# Patient Record
Sex: Female | Born: 1975 | Race: Black or African American | Hispanic: No | Smoking: Current every day smoker
Health system: Southern US, Community
[De-identification: ages and names within clinical notes are randomized; demographics above are authoritative.]

## PROBLEM LIST (undated history)

## (undated) ENCOUNTER — Inpatient Hospital Stay (HOSPITAL_COMMUNITY): Payer: Medicaid Other

## (undated) ENCOUNTER — Emergency Department (HOSPITAL_COMMUNITY): Admission: EM | Payer: Medicaid Other | Source: Home / Self Care

## (undated) DIAGNOSIS — G4730 Sleep apnea, unspecified: Secondary | ICD-10-CM

## (undated) DIAGNOSIS — M1990 Unspecified osteoarthritis, unspecified site: Secondary | ICD-10-CM

## (undated) DIAGNOSIS — D649 Anemia, unspecified: Secondary | ICD-10-CM

## (undated) DIAGNOSIS — I10 Essential (primary) hypertension: Secondary | ICD-10-CM

## (undated) DIAGNOSIS — D179 Benign lipomatous neoplasm, unspecified: Secondary | ICD-10-CM

## (undated) DIAGNOSIS — F209 Schizophrenia, unspecified: Secondary | ICD-10-CM

## (undated) DIAGNOSIS — F32A Depression, unspecified: Secondary | ICD-10-CM

## (undated) DIAGNOSIS — F329 Major depressive disorder, single episode, unspecified: Secondary | ICD-10-CM

## (undated) DIAGNOSIS — F419 Anxiety disorder, unspecified: Secondary | ICD-10-CM

## (undated) DIAGNOSIS — J189 Pneumonia, unspecified organism: Secondary | ICD-10-CM

## (undated) DIAGNOSIS — K219 Gastro-esophageal reflux disease without esophagitis: Secondary | ICD-10-CM

## (undated) DIAGNOSIS — F319 Bipolar disorder, unspecified: Secondary | ICD-10-CM

## (undated) DIAGNOSIS — F1910 Other psychoactive substance abuse, uncomplicated: Secondary | ICD-10-CM

## (undated) DIAGNOSIS — E669 Obesity, unspecified: Secondary | ICD-10-CM

## (undated) HISTORY — PX: ABDOMINAL HYSTERECTOMY: SHX81

## (undated) HISTORY — PX: TUBAL LIGATION: SHX77

## (undated) HISTORY — PX: DENTAL SURGERY: SHX609

---

## 1997-04-19 ENCOUNTER — Ambulatory Visit (HOSPITAL_COMMUNITY): Admission: RE | Admit: 1997-04-19 | Discharge: 1997-04-19 | Payer: Self-pay | Admitting: Obstetrics

## 1997-04-22 ENCOUNTER — Inpatient Hospital Stay (HOSPITAL_COMMUNITY): Admission: AD | Admit: 1997-04-22 | Discharge: 1997-04-28 | Payer: Self-pay | Admitting: Obstetrics

## 1997-07-02 ENCOUNTER — Inpatient Hospital Stay (HOSPITAL_COMMUNITY): Admission: AD | Admit: 1997-07-02 | Discharge: 1997-07-02 | Payer: Self-pay | Admitting: Obstetrics

## 1997-08-19 ENCOUNTER — Inpatient Hospital Stay (HOSPITAL_COMMUNITY): Admission: AD | Admit: 1997-08-19 | Discharge: 1997-08-23 | Payer: Self-pay | Admitting: Obstetrics

## 1997-09-16 ENCOUNTER — Inpatient Hospital Stay (HOSPITAL_COMMUNITY): Admission: AD | Admit: 1997-09-16 | Discharge: 1997-09-20 | Payer: Self-pay | Admitting: Obstetrics & Gynecology

## 1998-02-20 ENCOUNTER — Other Ambulatory Visit: Admission: RE | Admit: 1998-02-20 | Discharge: 1998-02-20 | Payer: Self-pay | Admitting: Obstetrics

## 1998-02-20 ENCOUNTER — Inpatient Hospital Stay (HOSPITAL_COMMUNITY): Admission: AD | Admit: 1998-02-20 | Discharge: 1998-02-20 | Payer: Self-pay | Admitting: Obstetrics

## 1998-02-22 ENCOUNTER — Ambulatory Visit (HOSPITAL_COMMUNITY): Admission: RE | Admit: 1998-02-22 | Discharge: 1998-02-22 | Payer: Self-pay | Admitting: Obstetrics

## 1998-06-30 ENCOUNTER — Inpatient Hospital Stay (HOSPITAL_COMMUNITY): Admission: AD | Admit: 1998-06-30 | Discharge: 1998-06-30 | Payer: Self-pay | Admitting: Obstetrics

## 1998-07-16 ENCOUNTER — Emergency Department (HOSPITAL_COMMUNITY): Admission: EM | Admit: 1998-07-16 | Discharge: 1998-07-16 | Payer: Self-pay | Admitting: Emergency Medicine

## 1998-07-25 ENCOUNTER — Observation Stay (HOSPITAL_COMMUNITY): Admission: AD | Admit: 1998-07-25 | Discharge: 1998-07-26 | Payer: Self-pay | Admitting: Obstetrics

## 1998-07-30 ENCOUNTER — Inpatient Hospital Stay (HOSPITAL_COMMUNITY): Admission: AD | Admit: 1998-07-30 | Discharge: 1998-07-30 | Payer: Self-pay | Admitting: Obstetrics

## 1998-09-08 ENCOUNTER — Inpatient Hospital Stay (HOSPITAL_COMMUNITY): Admission: AD | Admit: 1998-09-08 | Discharge: 1998-09-08 | Payer: Self-pay | Admitting: Obstetrics

## 1998-09-16 ENCOUNTER — Encounter (HOSPITAL_COMMUNITY): Admission: RE | Admit: 1998-09-16 | Discharge: 1998-10-04 | Payer: Self-pay | Admitting: Obstetrics

## 1998-09-23 ENCOUNTER — Encounter: Payer: Self-pay | Admitting: Obstetrics

## 1998-10-03 ENCOUNTER — Inpatient Hospital Stay (HOSPITAL_COMMUNITY): Admission: AD | Admit: 1998-10-03 | Discharge: 1998-10-05 | Payer: Self-pay | Admitting: Obstetrics

## 1998-10-03 ENCOUNTER — Encounter (INDEPENDENT_AMBULATORY_CARE_PROVIDER_SITE_OTHER): Payer: Self-pay

## 1999-06-04 ENCOUNTER — Other Ambulatory Visit: Admission: RE | Admit: 1999-06-04 | Discharge: 1999-06-04 | Payer: Self-pay | Admitting: Obstetrics

## 1999-07-27 ENCOUNTER — Encounter: Payer: Self-pay | Admitting: Obstetrics

## 1999-07-27 ENCOUNTER — Inpatient Hospital Stay (HOSPITAL_COMMUNITY): Admission: AD | Admit: 1999-07-27 | Discharge: 1999-07-27 | Payer: Self-pay | Admitting: Obstetrics

## 1999-11-23 ENCOUNTER — Encounter: Payer: Self-pay | Admitting: Emergency Medicine

## 1999-11-23 ENCOUNTER — Emergency Department (HOSPITAL_COMMUNITY): Admission: EM | Admit: 1999-11-23 | Discharge: 1999-11-23 | Payer: Self-pay | Admitting: Emergency Medicine

## 2000-06-04 ENCOUNTER — Emergency Department (HOSPITAL_COMMUNITY): Admission: EM | Admit: 2000-06-04 | Discharge: 2000-06-04 | Payer: Self-pay | Admitting: Emergency Medicine

## 2001-09-15 ENCOUNTER — Emergency Department (HOSPITAL_COMMUNITY): Admission: EM | Admit: 2001-09-15 | Discharge: 2001-09-15 | Payer: Self-pay | Admitting: Emergency Medicine

## 2002-02-14 ENCOUNTER — Emergency Department (HOSPITAL_COMMUNITY): Admission: EM | Admit: 2002-02-14 | Discharge: 2002-02-14 | Payer: Self-pay | Admitting: Emergency Medicine

## 2002-11-02 ENCOUNTER — Emergency Department (HOSPITAL_COMMUNITY): Admission: EM | Admit: 2002-11-02 | Discharge: 2002-11-02 | Payer: Self-pay | Admitting: Emergency Medicine

## 2003-04-01 ENCOUNTER — Emergency Department (HOSPITAL_COMMUNITY): Admission: EM | Admit: 2003-04-01 | Discharge: 2003-04-01 | Payer: Self-pay | Admitting: Emergency Medicine

## 2003-06-27 ENCOUNTER — Emergency Department (HOSPITAL_COMMUNITY): Admission: EM | Admit: 2003-06-27 | Discharge: 2003-06-27 | Payer: Self-pay | Admitting: Emergency Medicine

## 2003-06-28 ENCOUNTER — Emergency Department (HOSPITAL_COMMUNITY): Admission: EM | Admit: 2003-06-28 | Discharge: 2003-06-29 | Payer: Self-pay | Admitting: Emergency Medicine

## 2003-07-02 ENCOUNTER — Emergency Department (HOSPITAL_COMMUNITY): Admission: EM | Admit: 2003-07-02 | Discharge: 2003-07-02 | Payer: Self-pay | Admitting: Emergency Medicine

## 2003-08-01 ENCOUNTER — Inpatient Hospital Stay (HOSPITAL_COMMUNITY): Admission: AD | Admit: 2003-08-01 | Discharge: 2003-08-01 | Payer: Self-pay | Admitting: Obstetrics

## 2003-11-08 ENCOUNTER — Emergency Department (HOSPITAL_COMMUNITY): Admission: EM | Admit: 2003-11-08 | Discharge: 2003-11-08 | Payer: Self-pay

## 2004-01-11 ENCOUNTER — Emergency Department (HOSPITAL_COMMUNITY): Admission: EM | Admit: 2004-01-11 | Discharge: 2004-01-11 | Payer: Self-pay | Admitting: Emergency Medicine

## 2004-04-14 ENCOUNTER — Emergency Department (HOSPITAL_COMMUNITY): Admission: EM | Admit: 2004-04-14 | Discharge: 2004-04-14 | Payer: Self-pay | Admitting: Emergency Medicine

## 2004-07-15 ENCOUNTER — Emergency Department (HOSPITAL_COMMUNITY): Admission: EM | Admit: 2004-07-15 | Discharge: 2004-07-15 | Payer: Self-pay | Admitting: Internal Medicine

## 2004-12-03 ENCOUNTER — Emergency Department (HOSPITAL_COMMUNITY): Admission: EM | Admit: 2004-12-03 | Discharge: 2004-12-03 | Payer: Self-pay | Admitting: Emergency Medicine

## 2005-07-10 ENCOUNTER — Emergency Department (HOSPITAL_COMMUNITY): Admission: EM | Admit: 2005-07-10 | Discharge: 2005-07-10 | Payer: Self-pay | Admitting: Emergency Medicine

## 2005-10-24 ENCOUNTER — Emergency Department (HOSPITAL_COMMUNITY): Admission: EM | Admit: 2005-10-24 | Discharge: 2005-10-24 | Payer: Self-pay | Admitting: Emergency Medicine

## 2006-07-10 ENCOUNTER — Emergency Department (HOSPITAL_COMMUNITY): Admission: EM | Admit: 2006-07-10 | Discharge: 2006-07-10 | Payer: Self-pay | Admitting: Emergency Medicine

## 2006-10-31 ENCOUNTER — Emergency Department (HOSPITAL_COMMUNITY): Admission: EM | Admit: 2006-10-31 | Discharge: 2006-10-31 | Payer: Self-pay | Admitting: Emergency Medicine

## 2006-11-08 ENCOUNTER — Emergency Department (HOSPITAL_COMMUNITY): Admission: EM | Admit: 2006-11-08 | Discharge: 2006-11-09 | Payer: Self-pay | Admitting: Emergency Medicine

## 2007-09-13 ENCOUNTER — Emergency Department (HOSPITAL_COMMUNITY): Admission: EM | Admit: 2007-09-13 | Discharge: 2007-09-13 | Payer: Self-pay | Admitting: Emergency Medicine

## 2008-03-05 ENCOUNTER — Emergency Department (HOSPITAL_COMMUNITY): Admission: EM | Admit: 2008-03-05 | Discharge: 2008-03-05 | Payer: Self-pay | Admitting: Emergency Medicine

## 2008-07-28 ENCOUNTER — Emergency Department (HOSPITAL_COMMUNITY): Admission: EM | Admit: 2008-07-28 | Discharge: 2008-07-28 | Payer: Self-pay | Admitting: Emergency Medicine

## 2009-02-07 ENCOUNTER — Emergency Department (HOSPITAL_COMMUNITY): Admission: EM | Admit: 2009-02-07 | Discharge: 2009-02-07 | Payer: Self-pay | Admitting: Emergency Medicine

## 2009-03-09 ENCOUNTER — Emergency Department (HOSPITAL_COMMUNITY): Admission: EM | Admit: 2009-03-09 | Discharge: 2009-03-09 | Payer: Self-pay | Admitting: Family Medicine

## 2009-03-13 ENCOUNTER — Emergency Department (HOSPITAL_COMMUNITY): Admission: EM | Admit: 2009-03-13 | Discharge: 2009-03-13 | Payer: Self-pay | Admitting: Emergency Medicine

## 2009-04-11 ENCOUNTER — Emergency Department (HOSPITAL_COMMUNITY): Admission: EM | Admit: 2009-04-11 | Discharge: 2009-04-11 | Payer: Self-pay | Admitting: Emergency Medicine

## 2009-04-15 ENCOUNTER — Emergency Department (HOSPITAL_COMMUNITY): Admission: EM | Admit: 2009-04-15 | Discharge: 2009-04-15 | Payer: Self-pay | Admitting: Emergency Medicine

## 2009-06-18 ENCOUNTER — Emergency Department (HOSPITAL_COMMUNITY): Admission: EM | Admit: 2009-06-18 | Discharge: 2009-06-18 | Payer: Self-pay | Admitting: Emergency Medicine

## 2009-08-19 ENCOUNTER — Emergency Department (HOSPITAL_COMMUNITY): Admission: EM | Admit: 2009-08-19 | Discharge: 2009-08-19 | Payer: Self-pay | Admitting: Emergency Medicine

## 2010-02-20 ENCOUNTER — Emergency Department (HOSPITAL_COMMUNITY)
Admission: EM | Admit: 2010-02-20 | Discharge: 2010-02-20 | Payer: Self-pay | Source: Home / Self Care | Admitting: Emergency Medicine

## 2010-05-19 LAB — URINALYSIS, ROUTINE W REFLEX MICROSCOPIC
Bilirubin Urine: NEGATIVE
Nitrite: NEGATIVE
Urobilinogen, UA: 0.2 mg/dL (ref 0.0–1.0)

## 2010-05-19 LAB — BASIC METABOLIC PANEL
BUN: 9 mg/dL (ref 6–23)
CO2: 25 mEq/L (ref 19–32)
Chloride: 102 mEq/L (ref 96–112)
Creatinine, Ser: 0.95 mg/dL (ref 0.4–1.2)
GFR calc Af Amer: >60 mL/min (ref >60)
Glucose, Bld: 87 mg/dL (ref 70–99)
Potassium: 3.9 mEq/L (ref 3.5–5.1)

## 2010-05-19 LAB — DIFFERENTIAL
Basophils Absolute: 0 10*3/uL (ref 0.0–0.1)
Eosinophils Absolute: 0.2 10*3/uL (ref 0.0–0.7)
Lymphocytes Relative: 29 % (ref 12–46)
Monocytes Absolute: 1 10*3/uL (ref 0.1–1.0)
Neutro Abs: 6.3 10*3/uL (ref 1.7–7.7)
Neutrophils Relative %: 60 % (ref 43–77)

## 2010-05-19 LAB — WET PREP, GENITAL
WBC, Wet Prep HPF POC: NONE SEEN
Yeast Wet Prep HPF POC: NONE SEEN

## 2010-05-19 LAB — CBC
HCT: 37.4 % (ref 36.0–46.0)
MCV: 76 fL — ABNORMAL LOW (ref 78.0–100.0)
WBC: 10.5 10*3/uL (ref 4.0–10.5)

## 2010-05-24 LAB — URINALYSIS, ROUTINE W REFLEX MICROSCOPIC
Bilirubin Urine: NEGATIVE
Glucose, UA: NEGATIVE mg/dL
Hgb urine dipstick: NEGATIVE
Ketones, ur: NEGATIVE mg/dL
Nitrite: NEGATIVE
Protein, ur: NEGATIVE mg/dL
Urobilinogen, UA: 0.2 mg/dL (ref 0.0–1.0)

## 2010-05-24 LAB — URINE MICROSCOPIC-ADD ON

## 2010-06-10 LAB — POCT I-STAT, CHEM 8
BUN: 5 mg/dL — ABNORMAL LOW (ref 6–23)
Chloride: 105 mEq/L (ref 96–112)
Creatinine, Ser: 0.8 mg/dL (ref 0.4–1.2)
Glucose, Bld: 84 mg/dL (ref 70–99)
HCT: 39 % (ref 36.0–46.0)
Sodium: 141 mEq/L (ref 135–145)

## 2010-06-10 LAB — HEMOCCULT GUIAC POC 1CARD (OFFICE): Fecal Occult Bld: NEGATIVE

## 2010-12-04 LAB — WET PREP, GENITAL: Yeast Wet Prep HPF POC: NONE SEEN

## 2010-12-04 LAB — URINALYSIS, ROUTINE W REFLEX MICROSCOPIC
Bilirubin Urine: NEGATIVE
Glucose, UA: NEGATIVE
Ketones, ur: NEGATIVE
Nitrite: NEGATIVE
Specific Gravity, Urine: 1.018

## 2010-12-04 LAB — URINE MICROSCOPIC-ADD ON

## 2011-02-07 ENCOUNTER — Encounter: Payer: Self-pay | Admitting: *Deleted

## 2011-02-07 ENCOUNTER — Emergency Department (HOSPITAL_COMMUNITY)
Admission: EM | Admit: 2011-02-07 | Discharge: 2011-02-07 | Disposition: A | Discharge status: Home / Self Care | Payer: Self-pay | Attending: Emergency Medicine | Admitting: Emergency Medicine

## 2011-02-07 DIAGNOSIS — R05 Cough: Secondary | ICD-10-CM | POA: Insufficient documentation

## 2011-02-07 DIAGNOSIS — J029 Acute pharyngitis, unspecified: Secondary | ICD-10-CM | POA: Insufficient documentation

## 2011-02-07 DIAGNOSIS — N898 Other specified noninflammatory disorders of vagina: Secondary | ICD-10-CM | POA: Insufficient documentation

## 2011-02-07 DIAGNOSIS — R059 Cough, unspecified: Secondary | ICD-10-CM | POA: Insufficient documentation

## 2011-02-07 DIAGNOSIS — R109 Unspecified abdominal pain: Secondary | ICD-10-CM | POA: Insufficient documentation

## 2011-02-07 DIAGNOSIS — E669 Obesity, unspecified: Secondary | ICD-10-CM | POA: Insufficient documentation

## 2011-02-07 LAB — POCT I-STAT, CHEM 8
BUN: 10 mg/dL (ref 6–23)
Calcium, Ion: 1.08 mmol/L — ABNORMAL LOW (ref 1.12–1.32)
Chloride: 104 mEq/L (ref 96–112)
Creatinine, Ser: 1.1 mg/dL (ref 0.50–1.10)
Glucose, Bld: 85 mg/dL (ref 70–99)
HCT: 39 % (ref 36.0–46.0)
Hemoglobin: 13.3 g/dL (ref 12.0–15.0)
Potassium: 3.8 mEq/L (ref 3.5–5.1)
Sodium: 139 mEq/L (ref 135–145)
TCO2: 23 mmol/L (ref 0–100)

## 2011-02-07 LAB — URINALYSIS, ROUTINE W REFLEX MICROSCOPIC
Bilirubin Urine: NEGATIVE
Glucose, UA: NEGATIVE mg/dL
Ketones, ur: NEGATIVE mg/dL
Leukocytes, UA: NEGATIVE
Protein, ur: NEGATIVE mg/dL
pH: 6.5 (ref 5.0–8.0)

## 2011-02-07 LAB — URINE MICROSCOPIC-ADD ON

## 2011-02-07 LAB — WET PREP, GENITAL
Clue Cells Wet Prep HPF POC: NONE SEEN
Trich, Wet Prep: NONE SEEN
Yeast Wet Prep HPF POC: NONE SEEN

## 2011-02-07 LAB — POCT PREGNANCY, URINE: Preg Test, Ur: NEGATIVE

## 2011-02-07 MED ORDER — OXYCODONE-ACETAMINOPHEN 5-325 MG PO TABS: 1.0000 | ORAL_TABLET | Freq: Four times a day (QID) | ORAL | Status: AC | PRN

## 2011-02-07 MED ORDER — OXYCODONE-ACETAMINOPHEN 5-325 MG PO TABS
1.0000 | ORAL_TABLET | Freq: Once | ORAL | Status: AC
  Administered 2011-02-07: 1 via ORAL
  Filled 2011-02-07: qty 1

## 2011-02-07 MED ORDER — TRAMADOL HCL 50 MG PO TABS: 50.0000 mg | ORAL_TABLET | Freq: Four times a day (QID) | ORAL | Status: DC | PRN

## 2011-02-07 NOTE — ED Provider Notes (Signed)
History     CSN: [id number] Arrival date & time: 02/07/2011  7:03 AM   First MD Initiated Contact with Patient 02/07/11 0725      Chief Complaint  Patient presents with  . Abdominal Pain    (Consider location/radiation/quality/duration/timing/severity/associated sxs/prior treatment) The history is provided by the patient.   patient is a 35 year old female who presents with multiple complaints. She reports lower abdominal cramping x2 days; this started the day after the onset of her menses. She has been taking ibuprofen for the pain with transient relief. The pain does not radiate anywhere; it is moderate to severe. It is intermittent. There is no associated nausea, vomiting, diarrhea or fever.   She also complains of a sore throat on sudden in onset last night but is associated with a cough but no fever, chills, sinus congestion, ear pain. The pain is sharp it does not radiate anywhere it is severe. She has not tried anything to relieve the pain. There are no aggravating or alleviating factors.  History reviewed. No pertinent past medical history.  History reviewed. No pertinent past surgical history.  History reviewed. No pertinent family history.  History  Substance Use Topics  . Smoking status: Not on file  . Smokeless tobacco: Not on file  . Alcohol Use: Not on file    Review of Systems  Constitutional: Negative for fever and chills.  HENT: Positive for sore throat. Negative for ear pain, nosebleeds, congestion, trouble swallowing, neck pain, neck stiffness, voice change and tinnitus.   Eyes: Negative for pain and visual disturbance.  Respiratory: Negative for cough and shortness of breath.   Cardiovascular: Negative for chest pain and leg swelling.  Gastrointestinal: Positive for abdominal pain. Negative for nausea, vomiting and diarrhea.  Genitourinary: Positive for vaginal discharge. Negative for dysuria, hematuria, flank pain and vaginal pain.       Positive menses    Musculoskeletal: Negative for back pain and gait problem.  Skin: Negative for rash and wound.  Neurological: Negative for dizziness, weakness, light-headedness and headaches.  Hematological: Does not bruise/bleed easily.  Psychiatric/Behavioral: Negative for behavioral problems and confusion.    Allergies  Ceftin  Home Medications   Current Outpatient Rx  Name Route Sig Dispense Refill  . HYDROCHLOROTHIAZIDE 25 MG PO TABS Oral Take 25 mg by mouth daily.        BP 126/84  Pulse 87  Temp(Src) 98.6 F (37 C) (Oral)  Resp 18  SpO2 98%  LMP 02/07/2011  Physical Exam  Nursing note and vitals reviewed. Constitutional: She is oriented to person, place, and time. She appears well-developed and well-nourished.       Uncomfortable appearing obese female  HENT:  Head: Normocephalic and atraumatic.  Right Ear: Hearing, tympanic membrane, external ear and ear canal normal.  Left Ear: Hearing, tympanic membrane, external ear and ear canal normal.  Nose: Nose normal.  Mouth/Throat: Uvula is midline and mucous membranes are normal. No uvula swelling. Oropharyngeal exudate and posterior oropharyngeal erythema present. No posterior oropharyngeal edema or tonsillar abscesses.  Eyes: Conjunctivae and EOM are normal. Pupils are equal, round, and reactive to light. Right eye exhibits no discharge. Left eye exhibits no discharge.  Neck: Normal range of motion. Neck supple.  Cardiovascular: Normal rate, regular rhythm, normal heart sounds and intact distal pulses.   No murmur heard. Pulmonary/Chest: Effort normal and breath sounds normal. No respiratory distress. She exhibits no tenderness.  Abdominal: Soft. Bowel sounds are normal. She exhibits no distension and no mass. There  is no rebound and no guarding.       Mild suprapubic tenderness to palpation  Genitourinary: There is no rash, tenderness or lesion on the right labia. There is no rash, tenderness or lesion on the left labia. Cervix  exhibits no motion tenderness and no friability. Right adnexum displays no mass and no tenderness. Left adnexum displays no mass and no tenderness. There is bleeding around the vagina. No erythema or tenderness around the vagina. No vaginal discharge found.  Musculoskeletal: Normal range of motion. She exhibits no edema and no tenderness.  Lymphadenopathy:    She has no cervical adenopathy.  Neurological: She is alert and oriented to person, place, and time. No cranial nerve deficit. Coordination normal.  Skin: Skin is warm and dry. No rash noted.  Psychiatric: She has a normal mood and affect. Her behavior is normal.    ED Course  Procedures (including critical care time)  Labs Reviewed  URINALYSIS, ROUTINE W REFLEX MICROSCOPIC - Abnormal; Notable for the following:    Hgb urine dipstick MODERATE (*)    All other components within normal limits  POCT I-STAT, CHEM 8 - Abnormal; Notable for the following:    Calcium, Ion 1.08 (*)    All other components within normal limits  URINE MICROSCOPIC-ADD ON - Abnormal; Notable for the following:    Squamous Epithelial / LPF FEW (*)    All other components within normal limits  WET PREP, GENITAL - Abnormal; Notable for the following:    WBC, Wet Prep HPF POC RARE (*)    All other components within normal limits  RAPID STREP SCREEN  POCT PREGNANCY, URINE  I-STAT, CHEM 8  POCT PREGNANCY, URINE  GC/CHLAMYDIA PROBE AMP, GENITAL   No results found.   1. Pharyngitis   2. Abdominal cramps       MDM  Laboratory studies reviewed. Hematuria most likely secondary to menses. Patient with an apparent viral pharyngitis. Have discussed saltwater gargles and other supportive measures. Also with abdominal cramping that seems most consistent with menstrual cramping. Patient has been comfortable in the emergency department after administration of a small amount of oral pain medication. Will discharge home and advise followup if pain continues. Patient  agrees with the plan of care.        Elwyn Reach [place], [place] 02/07/11 1112

## 2011-02-07 NOTE — ED Provider Notes (Signed)
Medical screening examination/treatment/procedure(s) were performed by non-physician practitioner and as supervising physician I was immediately available for consultation/collaboration.  Raeford Razor, MD 02/07/11 [phone]

## 2011-02-07 NOTE — ED Notes (Signed)
Patient verbalized understanding of discharge instructions.  Patient ambulatory upon discharge. Patient also reporting she is allergic to Tramadol, Steph[place], PA notified

## 2011-02-07 NOTE — ED Notes (Signed)
The pt has her period and she is  C/o lower abd cramps since last pm.  lmp now

## 2011-02-07 NOTE — ED Notes (Signed)
Pt alert and oriented. NAD. C/o lower abdominal pain. Pt reports feels like "cramps" but pt reports she usually doesn't have cramps with her cycle. Pt c/o sore throat since last night. Reporting runny nose, coughing up thin, clear mucus

## 2011-02-08 ENCOUNTER — Emergency Department (HOSPITAL_COMMUNITY)
Admission: EM | Admit: 2011-02-08 | Discharge: 2011-02-08 | Disposition: A | Discharge status: Home / Self Care | Payer: Self-pay | Attending: Emergency Medicine | Admitting: Emergency Medicine

## 2011-02-08 ENCOUNTER — Encounter (HOSPITAL_COMMUNITY): Payer: Self-pay | Admitting: *Deleted

## 2011-02-08 DIAGNOSIS — M2550 Pain in unspecified joint: Secondary | ICD-10-CM | POA: Insufficient documentation

## 2011-02-08 DIAGNOSIS — R42 Dizziness and giddiness: Secondary | ICD-10-CM | POA: Insufficient documentation

## 2011-02-08 DIAGNOSIS — R070 Pain in throat: Secondary | ICD-10-CM | POA: Insufficient documentation

## 2011-02-08 DIAGNOSIS — R197 Diarrhea, unspecified: Secondary | ICD-10-CM | POA: Insufficient documentation

## 2011-02-08 DIAGNOSIS — R509 Fever, unspecified: Secondary | ICD-10-CM | POA: Insufficient documentation

## 2011-02-08 DIAGNOSIS — R1032 Left lower quadrant pain: Secondary | ICD-10-CM | POA: Insufficient documentation

## 2011-02-08 DIAGNOSIS — IMO0001 Reserved for inherently not codable concepts without codable children: Secondary | ICD-10-CM | POA: Insufficient documentation

## 2011-02-08 DIAGNOSIS — M542 Cervicalgia: Secondary | ICD-10-CM | POA: Insufficient documentation

## 2011-02-08 DIAGNOSIS — J111 Influenza due to unidentified influenza virus with other respiratory manifestations: Secondary | ICD-10-CM | POA: Insufficient documentation

## 2011-02-08 DIAGNOSIS — J3489 Other specified disorders of nose and nasal sinuses: Secondary | ICD-10-CM | POA: Insufficient documentation

## 2011-02-08 DIAGNOSIS — Z79899 Other long term (current) drug therapy: Secondary | ICD-10-CM | POA: Insufficient documentation

## 2011-02-08 DIAGNOSIS — R5381 Other malaise: Secondary | ICD-10-CM | POA: Insufficient documentation

## 2011-02-08 DIAGNOSIS — M549 Dorsalgia, unspecified: Secondary | ICD-10-CM | POA: Insufficient documentation

## 2011-02-08 DIAGNOSIS — R059 Cough, unspecified: Secondary | ICD-10-CM | POA: Insufficient documentation

## 2011-02-08 DIAGNOSIS — R05 Cough: Secondary | ICD-10-CM | POA: Insufficient documentation

## 2011-02-08 DIAGNOSIS — R51 Headache: Secondary | ICD-10-CM | POA: Insufficient documentation

## 2011-02-08 MED ORDER — IBUPROFEN 200 MG PO TABS
ORAL_TABLET | ORAL | Status: AC
  Filled 2011-02-08: qty 3

## 2011-02-08 MED ORDER — OSELTAMIVIR PHOSPHATE 75 MG PO CAPS: 75.0000 mg | ORAL_CAPSULE | Freq: Two times a day (BID) | ORAL | Status: AC

## 2011-02-08 MED ORDER — SODIUM CHLORIDE 0.9 % IV BOLUS (SEPSIS)
1000.0000 mL | Freq: Once | INTRAVENOUS | Status: AC
  Administered 2011-02-08: 1000 mL via INTRAVENOUS

## 2011-02-08 MED ORDER — IBUPROFEN 200 MG PO TABS
600.0000 mg | ORAL_TABLET | Freq: Once | ORAL | Status: AC
  Administered 2011-02-08: 600 mg via ORAL

## 2011-02-08 MED ORDER — IBUPROFEN 600 MG PO TABS: 600.0000 mg | ORAL_TABLET | Freq: Four times a day (QID) | ORAL | Status: AC | PRN

## 2011-02-08 MED ORDER — GUAIFENESIN-DM 100-10 MG/5ML PO SYRP: 5.0000 mL | ORAL_SOLUTION | Freq: Three times a day (TID) | ORAL | Status: AC | PRN

## 2011-02-08 MED ORDER — BENZONATATE 200 MG PO CAPS: 200.0000 mg | ORAL_CAPSULE | Freq: Three times a day (TID) | ORAL | Status: AC | PRN

## 2011-02-08 NOTE — ED Notes (Signed)
Reports being seen yesterday for cold symptoms, dc home and still doesn't feel good. Having cough, pain in back with cough, has congestion and sore throat. Mask on pt at triage. No resp distress noted.

## 2011-02-08 NOTE — ED Provider Notes (Signed)
History     CSN: [id number] Arrival date & time: 02/08/2011  6:49 AM   First MD Initiated Contact with Patient 02/08/11 [phone]      Chief Complaint  Patient presents with  . Sore Throat  . Cough    (Consider location/radiation/quality/duration/timing/severity/associated sxs/prior treatment) The history is provided by the patient.   Reports being seen yesterday for cold symptoms and abdominal pain. Was swabed for strep A and was found to be negative. Pelvic exam was benign, u preg was negative and UA was clean. Was diagnsosed with URI and menstrual cramping. Was then discharged home and still doesn't feel good. Having cough, pain in back with cough, has congestion and sore throat. States that cough is dry and non productive with associated rhinorrhea. Also notes sinus fullness and full feeling in the right eye and ear.  Denies pain in either location.  Endorses fevers and chills and a temp to 102 at home.  States had just a twinge of blood in sputum last night, but otherwise not.  States that she has a headache after coughing spells.  Having trouble sleeping.  Endorses myalgias and arthralgias predominantly in her upper spine and  left lateral back. Back pain is worse with cough. States that percocet broke the fevers and gave her some relief. Mentrual period is currently happening and she is having some mild residual LLQ abdominal pain that is improved with holding pressure on the location.  States had a small amount of diarrhea last night.   Denies exertional chest pain, SOB, blood in stool or urine.       History reviewed. No pertinent past medical history. Other than previously stated  History reviewed. No pertinent past surgical history.  History reviewed. No pertinent family history.  History  Substance Use Topics  . Smoking status: Not on file  . Smokeless tobacco: Not on file  . Alcohol Use: Not on file    OB History    Grav Para Term Preterm Abortions TAB SAB Ect Mult Living                    Review of Systems  Constitutional: Positive for fever, chills and malaise/fatigue.  HENT: Positive for congestion, sore throat and neck pain. Negative for hearing loss, ear pain, nosebleeds, tinnitus and ear discharge.   Eyes: Negative for blurred vision, pain and redness.  Respiratory: Positive for cough, hemoptysis and sputum production. Negative for shortness of breath, wheezing and stridor.   Cardiovascular: Negative for chest pain, palpitations and orthopnea.  Gastrointestinal: Positive for abdominal pain and diarrhea. Negative for vomiting, constipation, blood in stool and melena.  Genitourinary: Negative for dysuria, urgency, frequency, hematuria and flank pain.  Musculoskeletal: Positive for myalgias, back pain and joint pain.  Skin: Negative for rash.  Neurological: Positive for dizziness, weakness and headaches. Negative for tingling and loss of consciousness.   Review of Systems  Constitutional: Positive for fever, chills and malaise/fatigue.  HENT: Positive for congestion, sore throat and neck pain. Negative for hearing loss, ear pain, nosebleeds, tinnitus and ear discharge.   Eyes: Negative for blurred vision, pain and redness.  Respiratory: Positive for cough, hemoptysis and sputum production. Negative for shortness of breath, wheezing and stridor.   Cardiovascular: Negative for chest pain, palpitations and orthopnea.  Gastrointestinal: Positive for abdominal pain and diarrhea. Negative for vomiting, constipation, blood in stool and melena.  Genitourinary: Negative for dysuria, urgency, frequency, hematuria and flank pain.  Musculoskeletal: Positive for myalgias, back pain and joint  pain.  Skin: Negative for rash.  Neurological: Positive for dizziness, weakness and headaches. Negative for tingling and loss of consciousness.    Allergies  Tramadol and Ceftin  Home Medications   Current Outpatient Rx  Name Route Sig Dispense Refill  . ACETAMINOPHEN  500 MG PO TABS Oral Take 1,000 mg by mouth every 6 (six) hours as needed. For pain     . HYDROCHLOROTHIAZIDE 25 MG PO TABS Oral Take 25 mg by mouth daily.      . IBUPROFEN 200 MG PO TABS Oral Take 400 mg by mouth every 6 (six) hours as needed. For cramps     . OXYCODONE-ACETAMINOPHEN 5-325 MG PO TABS Oral Take 1 tablet by mouth every 6 (six) hours as needed for pain. 10 tablet 0    BP 151/87  Pulse 113  Temp(Src) 101.8 F (38.8 C) (Oral)  Resp 20  SpO2 97%  LMP 02/07/2011  Physical Exam  Constitutional: She is oriented to person, place, and time. No distress.  HENT:  Head: Normocephalic.  Right Ear: External ear and ear canal normal. No drainage, swelling or tenderness. Tympanic membrane is not perforated and not erythematous. No middle ear effusion. No decreased hearing is noted.  Left Ear: No drainage, swelling or tenderness. Tympanic membrane is not perforated and not erythematous.  No middle ear effusion. No decreased hearing is noted.  Nose: Mucosal edema and rhinorrhea present.  Mouth/Throat: No oral lesions. Posterior oropharyngeal edema and posterior oropharyngeal erythema present. No oropharyngeal exudate.  Eyes: Pupils are equal, round, and reactive to light.  Neck: No JVD present. No tracheal deviation present. No thyromegaly present.  Cardiovascular: Exam reveals no friction rub.   No murmur heard. Pulmonary/Chest: Effort normal and breath sounds normal. No stridor. No respiratory distress. She has no wheezes. She has no rales. She exhibits no tenderness.  Abdominal: Soft. Bowel sounds are normal. She exhibits no distension. There is no tenderness. There is no rebound and no guarding.       No significant pain on palpation of LLQ. Pt states it felt better with pressure  Musculoskeletal: She exhibits no edema and no tenderness.  Lymphadenopathy:    She has no cervical adenopathy.  Neurological: She is alert and oriented to person, place, and time. No cranial nerve deficit.  She exhibits normal muscle tone. Coordination normal.  Skin: Skin is warm and dry. No rash noted. She is not diaphoretic. No erythema. No pallor.  Psychiatric: She has a normal mood and affect. Her behavior is normal. Judgment and thought content normal.    ED Course  Procedures (including critical care time) None. Given 1L NS and ibuprofen in the ED.  Labs Reviewed - No data to display No results found.   No diagnosis found.    MDM   Pt most likely has influenza vs viral URI. Totally clear lung exam makes PNA less likely.  No real sinus pain, just full feeling from congestion.  Given documented fever in the ED and relatively short time course, elected to treat for acute influenza with tamiflu.  Gave 1L NS in the ED for description of orthostasis.  Treat pain with NSAIDs. Gave 4 day work/school note and D/C to home.         Quentin Ore, MD 02/08/11 [phone]

## 2011-02-08 NOTE — ED Provider Notes (Signed)
I saw and evaluated the patient, reviewed the resident's note and I agree with the findings and plan.   .Face to face Exam:  General:  Awake HEENT:  Atraumatic Resp:  Normal effort Abd:  Nondistended Neuro:No focal weakness Lymph: No adenopathy   Nelia Shi, MD 02/08/11 1850

## 2011-02-09 LAB — GC/CHLAMYDIA PROBE AMP, GENITAL
Chlamydia, DNA Probe: NEGATIVE
GC Probe Amp, Genital: NEGATIVE

## 2011-07-09 ENCOUNTER — Encounter (HOSPITAL_COMMUNITY): Payer: Self-pay | Admitting: *Deleted

## 2011-07-09 ENCOUNTER — Emergency Department (HOSPITAL_COMMUNITY)
Admission: EM | Admit: 2011-07-09 | Discharge: 2011-07-09 | Disposition: A | Discharge status: Home / Self Care | Payer: Self-pay | Attending: Emergency Medicine | Admitting: Emergency Medicine

## 2011-07-09 DIAGNOSIS — M545 Low back pain, unspecified: Secondary | ICD-10-CM | POA: Insufficient documentation

## 2011-07-09 DIAGNOSIS — R109 Unspecified abdominal pain: Secondary | ICD-10-CM | POA: Insufficient documentation

## 2011-07-09 HISTORY — DX: Essential (primary) hypertension: I10

## 2011-07-09 HISTORY — DX: Obesity, unspecified: E66.9

## 2011-07-09 LAB — URINALYSIS, ROUTINE W REFLEX MICROSCOPIC
Glucose, UA: NEGATIVE mg/dL
Leukocytes, UA: NEGATIVE
Protein, ur: NEGATIVE mg/dL
pH: 6.5 (ref 5.0–8.0)

## 2011-07-09 LAB — POCT PREGNANCY, URINE: Preg Test, Ur: NEGATIVE

## 2011-07-09 NOTE — ED Notes (Signed)
Called 2x no answer

## 2011-07-09 NOTE — ED Notes (Signed)
Dull cramp and pain in lower abdomen and lower back.  Increasing pain when having a BM.  No pain or burning with urination.  Symptoms began this am.

## 2011-07-09 NOTE — ED Notes (Signed)
Pt called 2 more times, no answer. Pt LWBS after triage

## 2011-08-15 ENCOUNTER — Emergency Department (HOSPITAL_COMMUNITY): Payer: Self-pay

## 2011-08-15 ENCOUNTER — Emergency Department (HOSPITAL_COMMUNITY)
Admission: EM | Admit: 2011-08-15 | Discharge: 2011-08-15 | Disposition: A | Discharge status: Home / Self Care | Payer: Self-pay | Attending: Emergency Medicine | Admitting: Emergency Medicine

## 2011-08-15 ENCOUNTER — Encounter (HOSPITAL_COMMUNITY): Payer: Self-pay | Admitting: Emergency Medicine

## 2011-08-15 DIAGNOSIS — I10 Essential (primary) hypertension: Secondary | ICD-10-CM | POA: Insufficient documentation

## 2011-08-15 DIAGNOSIS — R0602 Shortness of breath: Secondary | ICD-10-CM | POA: Insufficient documentation

## 2011-08-15 DIAGNOSIS — R079 Chest pain, unspecified: Secondary | ICD-10-CM | POA: Insufficient documentation

## 2011-08-15 DIAGNOSIS — J189 Pneumonia, unspecified organism: Secondary | ICD-10-CM | POA: Insufficient documentation

## 2011-08-15 DIAGNOSIS — G4730 Sleep apnea, unspecified: Secondary | ICD-10-CM | POA: Insufficient documentation

## 2011-08-15 DIAGNOSIS — I252 Old myocardial infarction: Secondary | ICD-10-CM | POA: Insufficient documentation

## 2011-08-15 DIAGNOSIS — M549 Dorsalgia, unspecified: Secondary | ICD-10-CM | POA: Insufficient documentation

## 2011-08-15 LAB — URINE MICROSCOPIC-ADD ON

## 2011-08-15 LAB — URINALYSIS, ROUTINE W REFLEX MICROSCOPIC
Bilirubin Urine: NEGATIVE
Nitrite: NEGATIVE
Specific Gravity, Urine: 1.015 (ref 1.005–1.030)
pH: 5.5 (ref 5.0–8.0)

## 2011-08-15 LAB — POCT PREGNANCY, URINE: Preg Test, Ur: NEGATIVE

## 2011-08-15 LAB — DIFFERENTIAL
Basophils Absolute: 0 10*3/uL (ref 0.0–0.1)
Lymphocytes Relative: 10 % — ABNORMAL LOW (ref 12–46)
Lymphs Abs: 2 10*3/uL (ref 0.7–4.0)
Monocytes Absolute: 1.2 10*3/uL — ABNORMAL HIGH (ref 0.1–1.0)
Monocytes Relative: 6 % (ref 3–12)
Neutro Abs: 16.9 10*3/uL — ABNORMAL HIGH (ref 1.7–7.7)

## 2011-08-15 LAB — CBC
HCT: 35.7 % — ABNORMAL LOW (ref 36.0–46.0)
Hemoglobin: 12.6 g/dL (ref 12.0–15.0)
RBC: 4.72 MIL/uL (ref 3.87–5.11)
WBC: 20.3 10*3/uL — ABNORMAL HIGH (ref 4.0–10.5)

## 2011-08-15 LAB — BASIC METABOLIC PANEL
CO2: 24 mEq/L (ref 19–32)
Chloride: 100 mEq/L (ref 96–112)
Creatinine, Ser: 0.93 mg/dL (ref 0.50–1.10)

## 2011-08-15 LAB — POCT I-STAT TROPONIN I

## 2011-08-15 LAB — D-DIMER, QUANTITATIVE: D-Dimer, Quant: 0.74 ug/mL-FEU — ABNORMAL HIGH (ref 0.00–0.48)

## 2011-08-15 MED ORDER — IOHEXOL 350 MG/ML SOLN
100.0000 mL | Freq: Once | INTRAVENOUS | Status: AC | PRN
  Administered 2011-08-15: 100 mL via INTRAVENOUS

## 2011-08-15 MED ORDER — DEXTROSE 5 % IV SOLN
1.0000 g | Freq: Once | INTRAVENOUS | Status: AC
  Administered 2011-08-15: 1 g via INTRAVENOUS
  Filled 2011-08-15: qty 10

## 2011-08-15 MED ORDER — DEXTROSE 5 % IV SOLN
500.0000 mg | Freq: Once | INTRAVENOUS | Status: AC
  Administered 2011-08-15: 500 mg via INTRAVENOUS
  Filled 2011-08-15: qty 500

## 2011-08-15 MED ORDER — ALBUTEROL SULFATE (5 MG/ML) 0.5% IN NEBU
5.0000 mg | INHALATION_SOLUTION | Freq: Once | RESPIRATORY_TRACT | Status: AC
  Administered 2011-08-15: 5 mg via RESPIRATORY_TRACT
  Filled 2011-08-15: qty 1

## 2011-08-15 MED ORDER — OXYCODONE-ACETAMINOPHEN 5-325 MG PO TABS
2.0000 | ORAL_TABLET | Freq: Once | ORAL | Status: AC
  Administered 2011-08-15: 2 via ORAL
  Filled 2011-08-15: qty 2

## 2011-08-15 MED ORDER — OXYCODONE-ACETAMINOPHEN 5-325 MG PO TABS: 1.0000 | ORAL_TABLET | ORAL | Status: AC | PRN

## 2011-08-15 MED ORDER — AZITHROMYCIN 250 MG PO TABS: ORAL_TABLET | ORAL | Status: DC

## 2011-08-15 NOTE — ED Notes (Signed)
CDU full at the time. Nurse to call back when ready to receive patient.

## 2011-08-15 NOTE — ED Notes (Signed)
Pt discharged home, had no further questions. Encouraged to follow up as needed.

## 2011-08-15 NOTE — ED Provider Notes (Signed)
History     CSN: [id number]  Arrival date & time 08/15/11  0610   First MD Initiated Contact with Patient 08/15/11 [phone]      Chief Complaint  Patient presents with  . Shortness of Breath  . Chest Pain  . Back Pain    (Consider location/radiation/quality/duration/timing/severity/associated sxs/prior treatment) Patient is a 36 y.o. female presenting with chest pain.  Chest Pain The chest pain began 6 - 12 hours ago. Primary symptoms include fatigue, nausea and dizziness. Pertinent negatives for primary symptoms include no fever and no vomiting.  Dizziness also occurs with nausea and weakness. Dizziness does not occur with vomiting.   Associated symptoms include weakness.  Her past medical history is significant for hypertension, MI and sleep apnea.  Pertinent negatives for past medical history include no aneurysm, no anxiety/panic attacks, no aortic aneurysm, no aortic dissection, no CAD, no cancer, no COPD, no CHF, no diabetes, no DVT, no hyperlipidemia, no pacemaker, no PE, no PVD, no recent injury, no seizures, no strokes, no thyroid problem and no valve disorder. Past medical history comments: sickle cell trait  Her family medical history is significant for CAD in family, diabetes in family, heart disease in family, hyperlipidemia in family, hypertension in family and PE in family.  Pertinent negatives for family medical history include: no early MI in family, no sickle cell disease in family, no stroke in family and no sudden death in family.  Procedure history is negative for cardiac catheterization, echocardiogram, persantine thallium, stress echo, stress thallium and exercise treadmill test.   c/o SOB chest pain and back pain x 6 hours.  States that the chest pain/pressure comes and goes for minutes at a time nonradiating. Pain free presently.     States that the back and R neck pain has been going on for hours with SOB intermittantly. Back pain is better when she leans forward and  worse when she lays flat.   Went downstairs and got weak and dizzy about 12:30.  Went back to bed and felt an achy pressure in her chest especially on her left side.  Worse when she coughs and lays on her back.   States that her cough is worse than normal.  Felt that she was going to pass out around 2:30 when she took her blood pressure pill and melatonin.  Pain is worse in her back with a deep breath and laying flat on her back.  pmh hypertension.  Has missed no doses of meds. No pcp presently.  [facility]    Past Medical History  Diagnosis Date  . Obesity   . Hypertension     No past surgical history on file.  No family history on file.  History  Substance Use Topics  . Smoking status: Current Everyday Smoker    Types: Cigarettes  . Smokeless tobacco: Not on file  . Alcohol Use: No    OB History    Grav Para Term Preterm Abortions TAB SAB Ect Mult Living                  Review of Systems  Constitutional: Positive for chills and fatigue. Negative for fever.  HENT: Positive for ear pain and neck pain. Negative for sore throat, rhinorrhea and postnasal drip.   Cardiovascular: Positive for chest pain.  Gastrointestinal: Positive for nausea. Negative for vomiting, constipation and blood in stool.  Neurological: Positive for dizziness, weakness and light-headedness. Negative for  seizures, facial asymmetry and headaches.  Psychiatric/Behavioral: Negative.     Allergies  Tramadol and Ceftin  Home Medications   Current Outpatient Rx  Name Route Sig Dispense Refill  . HYDROCHLOROTHIAZIDE 25 MG PO TABS Oral Take 25 mg by mouth daily.      . IBUPROFEN 200 MG PO TABS Oral Take 600 mg by mouth every 6 (six) hours as needed. For pain.      BP 149/105  Pulse 95  Temp(Src) 99.3 F (37.4 C) (Oral)  Resp 18  SpO2 97%  Physical Exam  Nursing note and vitals reviewed. Constitutional: She is oriented to person, place, and time.  She appears well-developed and well-nourished.       Morbid obese  HENT:  Head: Normocephalic and atraumatic.  Eyes: Conjunctivae and EOM are normal. Pupils are equal, round, and reactive to light.  Neck: Normal range of motion. Neck supple.  Cardiovascular: Normal rate, regular rhythm, normal heart sounds and intact distal pulses.  Exam reveals no gallop and no friction rub.   No murmur heard. Pulmonary/Chest: Effort normal and breath sounds normal. No respiratory distress. She has no wheezes. She exhibits no tenderness.  Abdominal: Soft. Bowel sounds are normal. She exhibits no distension. There is no tenderness.  Musculoskeletal: Normal range of motion. She exhibits no edema and no tenderness.  Neurological: She is alert and oriented to person, place, and time. She has normal reflexes.  Skin: Skin is warm and dry.  Psychiatric: She has a normal mood and affect.    ED Course  Procedures (including critical care time)   Labs Reviewed  CBC  DIFFERENTIAL  BASIC METABOLIC PANEL  D-DIMER, QUANTITATIVE   No results found.   No diagnosis found.    MDM   Date: 08/15/2011  Rate: 97  Rhythm: normal sinus rhythm  QRS Axis: normal  Intervals: normal  ST/T Wave abnormalities: normal  Conduction Disutrbances:none  Narrative Interpretation:   Old EKG Reviewed: none available Chest pain x 12 hours with SOB.  Elevated D dimer.  CT angio show bilateral pneumonia.  Chest x-ray shows bronchitis.  HR elevated 106. WBC 20. EKG and Trop unremarkable.  Rocephen and azithromycin iv in the ER.  Rx for zpak.  Follow up on Monday with pcp.  Ibuprofen for pain.   Labs Reviewed  CBC - Abnormal; Notable for the following:    WBC 20.3 (*)    HCT 35.7 (*)    MCV 75.6 (*)    All other components within normal limits  DIFFERENTIAL - Abnormal; Notable for the following:    Neutrophils Relative 84 (*)    Neutro Abs 16.9 (*)    Lymphocytes Relative 10 (*)    Monocytes Absolute 1.2 (*)    All other  components within normal limits  BASIC METABOLIC PANEL - Abnormal; Notable for the following:    Glucose, Bld 106 (*)    GFR calc non Af Amer 79 (*)    All other components within normal limits  D-DIMER, QUANTITATIVE - Abnormal; Notable for the following:    D-Dimer, Quant 0.74 (*)    All other components within normal limits  URINALYSIS, ROUTINE W REFLEX MICROSCOPIC - Abnormal; Notable for the following:    Leukocytes, UA TRACE (*)    All other components within normal limits  POCT I-STAT TROPONIN I  POCT PREGNANCY, URINE  URINE MICROSCOPIC-ADD ON  LAB REPORT - SCANNED          Remi Haggard, NP 08/16/11 1300

## 2011-08-15 NOTE — Discharge Instructions (Signed)
Linda Sheppard chest x-ray today showed that she had bronchitis. The CT of your chest shows a to have pneumonia on both sides. Use the inhaler no more than 4 times a day. Take the antibiotics until they are gone. You can start them tomorrow. Take deep breaths and cough every 2 hours while you're awake. This area 8 lungs to help your pneumonia get better. Followup at the [facility] on Monday. Return to the ER if you have a high fever or severe shortness of breath nausea and vomiting.  Pneumonia, Adult Pneumonia is an infection of the lungs. It may be caused by a germ (virus or bacteria). Some types of pneumonia can spread easily from person to person. This can happen when you cough or sneeze. HOME CARE  Only take medicine as told by your doctor.   Take your medicine (antibiotics) as told. Finish it even if you start to feel better.   Do not smoke.   You may use a vaporizer or humidifier in your room. This can help loosen thick spit (mucus).   Sleep so you are almost sitting up (semi-upright). This helps reduce coughing.   Rest.  A shot (vaccine) can help prevent pneumonia. Shots are often advised for:  People over 98 years old.   Patients on chemotherapy.   People with long-term (chronic) lung problems.   People with immune system problems.  GET HELP RIGHT AWAY IF:   You are getting worse.   You cannot control your cough, and you are losing sleep.   You cough up blood.   Your pain gets worse, even with medicine.   You have a fever.   Any of your problems are getting worse, not better.   You have shortness of breath or chest pain.  MAKE SURE YOU:   Understand these instructions.   Will watch your condition.   Will get help right away if you are not doing well or get worse.  Document Released: 08/12/2007 Document Revised: 02/12/2011 Document Reviewed: 05/16/2010 [facility] Patient Information 2012 [place], [place].

## 2011-08-15 NOTE — ED Notes (Signed)
Per pt, she has been having some weakness since yesterday. Last night she begin having SOB and a cough. Dyspnea at rest. No hx of lung disease, asthma, Bronchitis, or any other respiratory illness. Cough is non-productive. She also stated that she has been having intermittent left sided CP and back pain (Radiating from neck to mid-back) since yesterday. Pain is an aching feeling and is worse with coughing. Pain became better when laying on back. Currently no respiratory or cardiac distress. Will continue to monitor.

## 2011-08-16 NOTE — ED Provider Notes (Signed)
Medical screening examination/treatment/procedure(s) were performed by non-physician practitioner and as supervising physician I was immediately available for consultation/collaboration.   Lyanne Co, MD 08/16/11 2256

## 2011-08-16 NOTE — ED Notes (Signed)
Patient called requesting change in Zithromax RX because it's too expensive for her. RX changed to Erythromycin 333 mg PO tid x seven days and Amoxicillin 500 mg - two tabs tid x seven days by Wayland Salinas MD. New RX called to [facility] [phone]

## 2012-04-07 ENCOUNTER — Encounter (HOSPITAL_COMMUNITY): Payer: Self-pay | Admitting: *Deleted

## 2012-04-07 ENCOUNTER — Emergency Department (HOSPITAL_COMMUNITY)
Admission: EM | Admit: 2012-04-07 | Discharge: 2012-04-07 | Disposition: A | Discharge status: Home / Self Care | Payer: Self-pay | Attending: Emergency Medicine | Admitting: Emergency Medicine

## 2012-04-07 ENCOUNTER — Emergency Department (HOSPITAL_COMMUNITY): Payer: Self-pay

## 2012-04-07 DIAGNOSIS — S8000XA Contusion of unspecified knee, initial encounter: Secondary | ICD-10-CM | POA: Insufficient documentation

## 2012-04-07 DIAGNOSIS — I10 Essential (primary) hypertension: Secondary | ICD-10-CM | POA: Insufficient documentation

## 2012-04-07 DIAGNOSIS — S0003XA Contusion of scalp, initial encounter: Secondary | ICD-10-CM | POA: Insufficient documentation

## 2012-04-07 DIAGNOSIS — S8001XA Contusion of right knee, initial encounter: Secondary | ICD-10-CM

## 2012-04-07 DIAGNOSIS — S0083XA Contusion of other part of head, initial encounter: Secondary | ICD-10-CM

## 2012-04-07 DIAGNOSIS — H1130 Conjunctival hemorrhage, unspecified eye: Secondary | ICD-10-CM | POA: Insufficient documentation

## 2012-04-07 DIAGNOSIS — S1093XA Contusion of unspecified part of neck, initial encounter: Secondary | ICD-10-CM | POA: Insufficient documentation

## 2012-04-07 DIAGNOSIS — E669 Obesity, unspecified: Secondary | ICD-10-CM | POA: Insufficient documentation

## 2012-04-07 DIAGNOSIS — S0990XA Unspecified injury of head, initial encounter: Secondary | ICD-10-CM | POA: Insufficient documentation

## 2012-04-07 MED ORDER — IBUPROFEN 600 MG PO TABS: 600.0000 mg | ORAL_TABLET | Freq: Four times a day (QID) | ORAL | Status: DC | PRN

## 2012-04-07 MED ORDER — HYDROCODONE-ACETAMINOPHEN 5-500 MG PO TABS: 1.0000 | ORAL_TABLET | Freq: Four times a day (QID) | ORAL | Status: DC | PRN

## 2012-04-07 MED ORDER — HYDROCODONE-ACETAMINOPHEN 5-325 MG PO TABS
1.0000 | ORAL_TABLET | Freq: Once | ORAL | Status: AC
  Administered 2012-04-07: 1 via ORAL
  Filled 2012-04-07: qty 1
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
## 2012-04-07 NOTE — ED Notes (Signed)
Patient transported to X-ray 
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
## 2012-04-08 NOTE — ED Provider Notes (Signed)
Medical screening examination/treatment/procedure(s) were performed by non-physician practitioner and as supervising physician I was immediately available for consultation/collaboration.   Taylore Hinde, MD 04/08/12 0908 

## 2012-06-19 ENCOUNTER — Encounter (HOSPITAL_COMMUNITY): Payer: Self-pay

## 2012-06-19 ENCOUNTER — Emergency Department (HOSPITAL_COMMUNITY)
Admission: EM | Admit: 2012-06-19 | Discharge: 2012-06-19 | Disposition: A | Discharge status: Other Acute Inpatient Hospital | Payer: Self-pay | Attending: Emergency Medicine | Admitting: Emergency Medicine

## 2012-06-19 ENCOUNTER — Emergency Department (HOSPITAL_COMMUNITY)
Admission: EM | Admit: 2012-06-19 | Discharge: 2012-06-19 | Disposition: A | Discharge status: Home / Self Care | Payer: Self-pay | Attending: Emergency Medicine | Admitting: Emergency Medicine

## 2012-06-19 ENCOUNTER — Inpatient Hospital Stay (HOSPITAL_COMMUNITY)
Admission: AD | Admit: 2012-06-19 | Discharge: 2012-06-24 | DRG: 885 | Disposition: A | Discharge status: Home / Self Care | Payer: No Typology Code available for payment source | Source: Intra-hospital | Attending: Psychiatry | Admitting: Psychiatry

## 2012-06-19 ENCOUNTER — Encounter (HOSPITAL_COMMUNITY): Payer: Self-pay | Admitting: Emergency Medicine

## 2012-06-19 DIAGNOSIS — F17200 Nicotine dependence, unspecified, uncomplicated: Secondary | ICD-10-CM | POA: Insufficient documentation

## 2012-06-19 DIAGNOSIS — R45851 Suicidal ideations: Secondary | ICD-10-CM

## 2012-06-19 DIAGNOSIS — F331 Major depressive disorder, recurrent, moderate: Secondary | ICD-10-CM

## 2012-06-19 DIAGNOSIS — E669 Obesity, unspecified: Secondary | ICD-10-CM | POA: Insufficient documentation

## 2012-06-19 DIAGNOSIS — F1010 Alcohol abuse, uncomplicated: Secondary | ICD-10-CM | POA: Insufficient documentation

## 2012-06-19 DIAGNOSIS — Z8659 Personal history of other mental and behavioral disorders: Secondary | ICD-10-CM | POA: Insufficient documentation

## 2012-06-19 DIAGNOSIS — I10 Essential (primary) hypertension: Secondary | ICD-10-CM | POA: Diagnosis present

## 2012-06-19 DIAGNOSIS — F329 Major depressive disorder, single episode, unspecified: Secondary | ICD-10-CM | POA: Insufficient documentation

## 2012-06-19 DIAGNOSIS — F1410 Cocaine abuse, uncomplicated: Secondary | ICD-10-CM

## 2012-06-19 DIAGNOSIS — F1420 Cocaine dependence, uncomplicated: Secondary | ICD-10-CM | POA: Insufficient documentation

## 2012-06-19 DIAGNOSIS — Z79899 Other long term (current) drug therapy: Secondary | ICD-10-CM

## 2012-06-19 DIAGNOSIS — F3289 Other specified depressive episodes: Secondary | ICD-10-CM | POA: Insufficient documentation

## 2012-06-19 DIAGNOSIS — F411 Generalized anxiety disorder: Secondary | ICD-10-CM | POA: Insufficient documentation

## 2012-06-19 DIAGNOSIS — F332 Major depressive disorder, recurrent severe without psychotic features: Principal | ICD-10-CM | POA: Diagnosis present

## 2012-06-19 DIAGNOSIS — F1910 Other psychoactive substance abuse, uncomplicated: Secondary | ICD-10-CM | POA: Insufficient documentation

## 2012-06-19 DIAGNOSIS — Z3202 Encounter for pregnancy test, result negative: Secondary | ICD-10-CM | POA: Insufficient documentation

## 2012-06-19 DIAGNOSIS — F1210 Cannabis abuse, uncomplicated: Secondary | ICD-10-CM | POA: Insufficient documentation

## 2012-06-19 HISTORY — DX: Bipolar disorder, unspecified: F31.9

## 2012-06-19 HISTORY — DX: Schizophrenia, unspecified: F20.9

## 2012-06-19 HISTORY — DX: Anxiety disorder, unspecified: F41.9

## 2012-06-19 HISTORY — DX: Depression, unspecified: F32.A

## 2012-06-19 HISTORY — DX: Major depressive disorder, single episode, unspecified: F32.9

## 2012-06-19 HISTORY — DX: Other psychoactive substance abuse, uncomplicated: F19.10

## 2012-06-19 LAB — CBC
HCT: 37.1 % (ref 36.0–46.0)
Hemoglobin: 12.9 g/dL (ref 12.0–15.0)
MCH: 26.8 pg (ref 26.0–34.0)
MCHC: 34.8 g/dL (ref 30.0–36.0)

## 2012-06-19 LAB — POCT PREGNANCY, URINE: Preg Test, Ur: NEGATIVE

## 2012-06-19 LAB — RAPID URINE DRUG SCREEN, HOSP PERFORMED
Amphetamines: NOT DETECTED
Benzodiazepines: NOT DETECTED
Tetrahydrocannabinol: POSITIVE — AB

## 2012-06-19 LAB — ETHANOL: Alcohol, Ethyl (B): 117 mg/dL — ABNORMAL HIGH (ref 0–11)

## 2012-06-19 LAB — COMPREHENSIVE METABOLIC PANEL
BUN: 6 mg/dL (ref 6–23)
Calcium: 8.8 mg/dL (ref 8.4–10.5)
Creatinine, Ser: 0.84 mg/dL (ref 0.50–1.10)
GFR calc Af Amer: >90 mL/min (ref >90)
Glucose, Bld: 79 mg/dL (ref 70–99)
Total Protein: 7.5 g/dL (ref 6.0–8.3)

## 2012-06-19 MED ORDER — LORAZEPAM 1 MG PO TABS
0.0000 mg | ORAL_TABLET | Freq: Four times a day (QID) | ORAL | Status: DC
  Administered 2012-06-19 (×2): 1 mg via ORAL
  Filled 2012-06-19 (×2): qty 1

## 2012-06-19 MED ORDER — LORAZEPAM 1 MG PO TABS: 0.0000 mg | ORAL_TABLET | Freq: Two times a day (BID) | ORAL | Status: DC

## 2012-06-19 MED ORDER — THIAMINE HCL 100 MG/ML IJ SOLN: 100.0000 mg | Freq: Every day | INTRAMUSCULAR | Status: DC

## 2012-06-19 MED ORDER — FOLIC ACID 1 MG PO TABS
1.0000 mg | ORAL_TABLET | Freq: Every day | ORAL | Status: DC
  Administered 2012-06-19: 1 mg via ORAL
  Filled 2012-06-19: qty 1

## 2012-06-19 MED ORDER — VITAMIN B-1 100 MG PO TABS
100.0000 mg | ORAL_TABLET | Freq: Every day | ORAL | Status: DC
  Administered 2012-06-19: 100 mg via ORAL
  Filled 2012-06-19: qty 1

## 2012-06-19 MED ORDER — ADULT MULTIVITAMIN W/MINERALS CH
1.0000 | ORAL_TABLET | Freq: Every day | ORAL | Status: DC
  Administered 2012-06-19: 1 via ORAL
  Filled 2012-06-19: qty 1

## 2012-06-19 NOTE — ED Notes (Addendum)
Pt. Was seen at [facility] this am for same reason and told only outpt. Therapy existed. She left there and came here. Pt. Last drank alcohol 3hr. Before arrival. States she drinks everyday.
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
## 2012-06-19 NOTE — ED Notes (Signed)
Pt states she is here for detox from alcohol and cocaine-states last used both 1 hour PTA-states never detoxed in past.  Pt states uses up to 300$ cocaine 3 x week.  Pt states she drinks 3 days a week when she is using cocaine.  Using cocaine 15 years.  Using alcohol for 20 years.

## 2012-06-19 NOTE — BHH Counselor (Signed)
Pt denies SI and HI. No psychosis present. Pt doesn't qualify for detox as she uses cocaine twice a week and drinks twice a week. EDP in agreement with plan to d/c and give pt outpatient resources as she doesn't meet criteria for inpatient treatment.  Evette Cristal, [place] Assessment Counselor

## 2012-06-19 NOTE — ED Provider Notes (Signed)
History     CSN: [id number]  Arrival date & time 06/19/12  6644   First MD Initiated Contact with Patient 06/19/12 0542      Chief Complaint  Patient presents with  . Medical Clearance    (Consider location/radiation/quality/duration/timing/severity/associated sxs/prior treatment) The history is provided by the patient.   patient requests detox off of alcohol, marijuana, and cocaine. She was seen at was a long for the same and was discharged hour ago. She had told the providers over there that she only drank twice a week and used cocaine twice a week. She tells me that she uses every day. She states that she was told to come over here because there were no beds over there and we may be allowed for find one here. She states she is also depressed. States she has a history of bipolar and schizophrenia. States she has some homicidal thoughts. She denies suicidal thoughts. She was seen by the ACT team and the ED attending over there. She was considered safe for discharge and outpatient treatment.  Past Medical History  Diagnosis Date  . Obesity   . Hypertension   . Polysubstance abuse   . Bipolar 1 disorder   . Schizophrenia     Past Surgical History  Procedure Laterality Date  . Tubal ligation    . Cesarean section      No family history on file.  History  Substance Use Topics  . Smoking status: Current Every Day Smoker -- 0.15 packs/day    Types: Cigarettes  . Smokeless tobacco: Not on file  . Alcohol Use: Yes     Comment: daily    OB History   Grav Para Term Preterm Abortions TAB SAB Ect Mult Living                  Review of Systems  Constitutional: Negative for activity change and appetite change.  HENT: Negative for neck stiffness.   Eyes: Negative for pain.  Respiratory: Negative for chest tightness and shortness of breath.   Cardiovascular: Negative for chest pain and leg swelling.  Gastrointestinal: Negative for nausea, vomiting, abdominal pain and  diarrhea.  Genitourinary: Negative for flank pain.  Musculoskeletal: Negative for back pain.  Skin: Negative for rash.  Neurological: Negative for weakness, numbness and headaches.  Psychiatric/Behavioral: Positive for suicidal ideas. Negative for behavioral problems. The patient is nervous/anxious.     Allergies  Tramadol and Ceftin  Home Medications  No current outpatient prescriptions on file.  BP 125/76  Pulse 98  Temp(Src) 98.2 F (36.8 C) (Oral)  Resp 20  SpO2 98%  LMP 05/26/2012  Physical Exam  Nursing note and vitals reviewed. Constitutional: She is oriented to person, place, and time. She appears well-developed and well-nourished.  HENT:  Head: Normocephalic and atraumatic.  Eyes: EOM are normal. Pupils are equal, round, and reactive to light.  Neck: Normal range of motion. Neck supple.  Cardiovascular: Normal rate, regular rhythm and normal heart sounds.   No murmur heard. Pulmonary/Chest: Effort normal and breath sounds normal. No respiratory distress. She has no wheezes. She has no rales.  Abdominal: Soft. Bowel sounds are normal. She exhibits no distension. There is no tenderness. There is no rebound and no guarding.  Musculoskeletal: Normal range of motion.  Neurological: She is alert and oriented to person, place, and time. No cranial nerve deficit.  Skin: Skin is warm and dry.  Psychiatric: She has a normal mood and affect. Her speech is normal and behavior  is normal.    ED Course  Procedures (including critical care time)  Labs Reviewed - No data to display No results found.   1. Substance abuse       MDM  Patient requesting detox off of alcohol marijuana and cocaine. She was recently seen for the same but states she was not honest with them about her amount of use. She denies hallucinations. She states she is homicidal. She has lab work from a few hours ago. She'll be seen by the ACT team.        Juliet Rude. Rubin Payor, MD 06/19/12 [phone]

## 2012-06-19 NOTE — ED Notes (Signed)
Pt changed into bariatric gown.  Pt and belongings wanded by security.

## 2012-06-19 NOTE — ED Notes (Signed)
Pt. Placed in bariatric gown and wanded by security.

## 2012-06-19 NOTE — BHH Counselor (Signed)
Writer informed Dr.Kohut that the patient has been accepted to [facility].
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
## 2012-06-19 NOTE — BHH Counselor (Signed)
Writer completed admission paperwork with patient.  Writer faxed the paperwork to the assessment office and gave the original documents to the nurse so that papers can be delivered with patient.  Writer attempted to inform Dr. Juleen China that the patient has been accepted but he is in the middle of a procedure with another patient.

## 2012-06-19 NOTE — BHH Counselor (Signed)
Ranae Pila, ACT counselor at [facility], submitted Pt for admission to [facility]. Laverle Hobby, [facility] confirmed bed availability. Verne Spurr, PA reviewed clinical information and accepted Pt to the service of Dr. Henrietta Dine, room 507-1. Notified Ava Elisabeth Most, ACT counselor, of acceptance.  Harlin Rain Patsy Baltimore, LPC, [facility] Assessment Counselor

## 2012-06-19 NOTE — ED Provider Notes (Signed)
History     CSN: [id number]  Arrival date & time 06/19/12  5784   First MD Initiated Contact with Patient 06/19/12 0326      Chief Complaint  Patient presents with  . Medical Clearance    HPI Linda Sheppard is a 37 y.o. female presenting to the emergency department for detoxification from alcohol and cocaine. She says she's never had a detox before, has never had any inpatient treatment programs. Patient says she's used cocaine for the last 15 years about twice a week, she says she drinks when she is using cocaine.  She asks me to define bipolar disorder, she says that some people think she is bipolar. When I described the symptoms of bipolar disorder including depression and mania, she says that's not what she has.  She says she is mean when she is drinking and using cocaine, she also says she is in conflict with her family who wants her to do certain things but she does not do them. She has had some conflict with her daughter recently over a mutual female acquaintance. Patient denies any delusions, suicidal or homicidal ideations-she says occasionally she would like to hurt her daughter over her recent conflict with her daughter, but says she is staying away from her to avoid that because "I don't want to go to jail."  She denies any hallucinations. Patient denies any physical complaints, denies any chest pains, shortness of breath, abdominal pain, nausea vomiting, diarrhea, dizziness, headache.  Past Medical History  Diagnosis Date  . Obesity   . Hypertension     Past Surgical History  Procedure Laterality Date  . Tubal ligation      No family history on file.  History  Substance Use Topics  . Smoking status: Current Every Day Smoker -- 0.15 packs/day    Types: Cigarettes  . Smokeless tobacco: Not on file  . Alcohol Use: Yes     Comment: 3 x week    OB History   Grav Para Term Preterm Abortions TAB SAB Ect Mult Living                  Review of Systems At least 10pt or  greater review of systems completed and are negative except where specified in the HPI.  Allergies  Tramadol and Ceftin  Home Medications  No current outpatient prescriptions on file.  BP 129/66  Pulse 101  Temp(Src) 99.4 F (37.4 C) (Oral)  Resp 18  Ht 5\' 6"  (1.676 m)  Wt 302 lb 6 oz (137.156 kg)  BMI 48.83 kg/m2  SpO2 97%  LMP 05/26/2012  Physical Exam  Nursing notes reviewed.  Electronic medical record reviewed. VITAL SIGNS:   Filed Vitals:   06/19/12 0202 06/19/12 0500  BP: 156/108 129/66  Pulse: 115 101  Temp: 99.4 F (37.4 C)   TempSrc: Oral   Resp:  18  Height: 5\' 6"  (1.676 m)   Weight: 302 lb 6 oz (137.156 kg)   SpO2: 100% 97%   CONSTITUTIONAL: Awake, oriented, appears non-toxic HENT: Atraumatic, normocephalic, oral mucosa pink and moist, airway patent. Nares patent without drainage. External ears normal. EYES: Conjunctiva clear, EOMI, PERRLA NECK: Trachea midline, non-tender, supple CARDIOVASCULAR: Normal heart rate, Normal rhythm, No murmurs, rubs, gallops PULMONARY/CHEST: Clear to auscultation, no rhonchi, wheezes, or rales. Symmetrical breath sounds. Non-tender. ABDOMINAL: Non-distended, morbidly obese, soft, non-tender - no rebound or guarding.  BS normal. NEUROLOGIC: Non-focal, moving all four extremities, no gross sensory or motor deficits. EXTREMITIES: No clubbing,  cyanosis, or edema SKIN: Warm, Dry, No erythema, No rash  ED Course  Procedures (including critical care time)  Labs Reviewed  CBC - Abnormal; Notable for the following:    WBC 12.1 (*)    MCV 77.0 (*)    All other components within normal limits  COMPREHENSIVE METABOLIC PANEL - Abnormal; Notable for the following:    Potassium 3.4 (*)    GFR calc non Af Amer 89 (*)    All other components within normal limits  ETHANOL - Abnormal; Notable for the following:    Alcohol, Ethyl (B) 117 (*)    All other components within normal limits  URINE RAPID DRUG SCREEN (HOSP PERFORMED)  POCT  PREGNANCY, URINE   No results found.   1. Cocaine abuse   2. Depression (emotion)   3. Alcohol abuse       MDM  Patient chief complaint depression, alcohol and cocaine abuse, using cocaine for 15 years, 2-3 times a week uses cocaine and drinks well she is doing so. When asked specifically if she wants rehabilitation, she walked. Discussed this with [facility] - who also evaluated the patient, they did not feel she was an inpatient candidate, we also agree that the patient is not suicidal or psychotic.  Both agreed the patient will be okay with the treated at in the community with counseling and outpatient drug rehabilitation.  I explained the diagnosis and have given explicit precautions to return to the ER including any other new or worsening symptoms. The patient understands and accepts the medical plan as it's been dictated and I have answered their questions. Discharge instructions concerning home care and prescriptions have been given.  The patient is STABLE and is discharged to home in good condition.      Jones Skene, MD 06/19/12 2242

## 2012-06-20 ENCOUNTER — Encounter (HOSPITAL_COMMUNITY): Payer: Self-pay | Admitting: Behavioral Health

## 2012-06-20 DIAGNOSIS — F332 Major depressive disorder, recurrent severe without psychotic features: Principal | ICD-10-CM

## 2012-06-20 DIAGNOSIS — F331 Major depressive disorder, recurrent, moderate: Secondary | ICD-10-CM | POA: Diagnosis present

## 2012-06-20 MED ORDER — HYDROXYZINE HCL 25 MG PO TABS: 25.0000 mg | ORAL_TABLET | Freq: Four times a day (QID) | ORAL | Status: DC | PRN

## 2012-06-20 MED ORDER — MAGNESIUM HYDROXIDE 400 MG/5ML PO SUSP: 30.0000 mL | Freq: Every day | ORAL | Status: DC | PRN

## 2012-06-20 MED ORDER — GABAPENTIN 100 MG PO CAPS
100.0000 mg | ORAL_CAPSULE | Freq: Three times a day (TID) | ORAL | Status: DC
  Administered 2012-06-20 – 2012-06-24 (×13): 100 mg via ORAL
  Filled 2012-06-20 (×3): qty 1
  Filled 2012-06-20: qty 42
  Filled 2012-06-20 (×7): qty 1
  Filled 2012-06-20: qty 42
  Filled 2012-06-20 (×4): qty 1
  Filled 2012-06-20: qty 42
  Filled 2012-06-20 (×2): qty 1

## 2012-06-20 MED ORDER — ACETAMINOPHEN 325 MG PO TABS: 650.0000 mg | ORAL_TABLET | Freq: Four times a day (QID) | ORAL | Status: DC | PRN

## 2012-06-20 MED ORDER — METHOCARBAMOL 500 MG PO TABS
500.0000 mg | ORAL_TABLET | Freq: Three times a day (TID) | ORAL | Status: DC | PRN
Start: 2012-06-20 — End: 2012-06-24

## 2012-06-20 MED ORDER — ALUM & MAG HYDROXIDE-SIMETH 200-200-20 MG/5ML PO SUSP: 30.0000 mL | ORAL | Status: DC | PRN

## 2012-06-20 MED ORDER — DULOXETINE HCL 30 MG PO CPEP
30.0000 mg | ORAL_CAPSULE | Freq: Every day | ORAL | Status: DC
  Administered 2012-06-20 – 2012-06-23 (×4): 30 mg via ORAL
  Filled 2012-06-20 (×6): qty 1

## 2012-06-20 MED ORDER — TRAZODONE HCL 50 MG PO TABS
50.0000 mg | ORAL_TABLET | Freq: Every evening | ORAL | Status: DC | PRN
  Administered 2012-06-20 (×2): 50 mg via ORAL
  Filled 2012-06-20 (×2): qty 1

## 2012-06-20 MED ORDER — CHLORDIAZEPOXIDE HCL 25 MG PO CAPS
25.0000 mg | ORAL_CAPSULE | Freq: Four times a day (QID) | ORAL | Status: DC | PRN
  Administered 2012-06-20 (×2): 25 mg via ORAL
  Filled 2012-06-20 (×2): qty 1

## 2012-06-20 MED ORDER — NABUMETONE 500 MG PO TABS
500.0000 mg | ORAL_TABLET | Freq: Two times a day (BID) | ORAL | Status: DC
  Administered 2012-06-20 – 2012-06-24 (×8): 500 mg via ORAL
  Filled 2012-06-20 (×10): qty 1
  Filled 2012-06-20 (×2): qty 28

## 2012-06-20 NOTE — Progress Notes (Signed)
Patient stated she had gone to the Psych ed earlier in the day 06/19/2012.  Patient states she was discharged but states she left her jewelry on the bed in a bag.  Patient states she left a diamond tennis bracelet and some rings in this bag.  Patient states when she realized this after she was discharged she call the psych ED and spoke to someone named Aurther Loft.  Patient states this person stated they would look for it and patient states this was the last she heard.  Writer call the psych ED to see if patient had left belongings and Nadeen Landau in the Psych Ed stated they looked everywhere and they did no find anything belonging to the patient.  Patient notified.

## 2012-06-20 NOTE — Progress Notes (Signed)
Adult Psychoeducational Group Note  Date:  06/20/2012 Time:  10:16 PM  Group Topic/Focus:  Identifying Needs:   The focus of this group is to help patients identify their personal needs that have been historically problematic and identify healthy behaviors to address their needs.  Participation Level:  Active  Participation Quality:  Supportive  Affect:  Appropriate  Cognitive:  Alert and Appropriate  Insight: Appropriate  Engagement in Group:  Supportive  Modes of Intervention:  Problem-solving  Additional Comments:  Maddyn engaged well in group and provided positive feedback to others.  Annell Greening Custer 06/20/2012, 10:16 PM

## 2012-06-20 NOTE — Progress Notes (Signed)
Patient ID: Linda Sheppard, female   DOB: 05/18/1975, 37 y.o.   MRN: [id number] D: Pt. Evasive, "I'm all right". Pt. Lying down then gets up and walks away from writer while she was trying to interact. Pt. Says "I'm looking for my daughter to bring me some clothes, she was suppose to been brought them". A: Writer introduces self to client and encourages her to reports concerns, but client was irritable, guarded just said to every question "I'm all right".  Pt. Later comes back  And apologizes for rude behavior. Staff encouraged group. Staff will monitor q33min for safety. R: Pt. Is safe on the unit. Pt. Attended group.
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
## 2012-06-20 NOTE — Progress Notes (Signed)
Nutrition Brief Note  Patient identified on the Malnutrition Screening Tool (MST) Report  Body mass index is 48.61 kg/(m^2). Patient meets criteria for extreme obesity based on current BMI.   Current diet order is regular, patient is consuming approximately good% of meals at this time. Labs and medications reviewed.   Patient reports weight loss from 320 lbs to 301 lbs secondary to depression.  Slow weight loss with healthy eating is appropriate.  Encouraged healthy eating.  No nutrition interventions warranted at this time. If nutrition issues arise, please consult RD.   Oran Rein, RD, LDN Clinical Inpatient Dietitian Pager:  [phone] Weekend and after hours pager:  [phone]
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
## 2012-06-20 NOTE — Tx Team (Signed)
Initial Interdisciplinary Treatment Plan  PATIENT STRENGTHS: (choose at least two) Ability for insight Active sense of humor Average or above average intelligence Capable of independent living Communication skills General fund of knowledge Motivation for treatment/growth  PATIENT STRESSORS: Marital or family conflict Substance abuse Traumatic event   PROBLEM LIST: Problem List/Patient Goals Date to be addressed Date deferred Reason deferred Estimated date of resolution  " I need help with the depression" 06/19/2012     "I want to get off the drugs and alcohol" 06/19/2012                                                DISCHARGE CRITERIA:  Ability to meet basic life and health needs Adequate post-discharge living arrangements Improved stabilization in mood, thinking, and/or behavior Motivation to continue treatment in a less acute level of care Need for constant or close observation no longer present Withdrawal symptoms are absent or subacute and managed without 24-hour nursing intervention  PRELIMINARY DISCHARGE PLAN: Attend aftercare/continuing care group Outpatient therapy Participate in family therapy Return to previous living arrangement Return to previous work or school arrangements  PATIENT/FAMIILY INVOLVEMENT: This treatment plan has been presented to and reviewed with the patient, Linda Sheppard.  The patient and family have been given the opportunity to ask questions and make suggestions.  [name] 06/20/2012, 1:11 AM

## 2012-06-20 NOTE — Progress Notes (Signed)
Recreation Therapy Notes  Date: 04.14.2014  Time: 3:00pm Location: [facility]     Group Topic/Focus: Self Expression  Participation Level: Did not attend  [employer], LRT/CTRS  Jearl Klinefelter 06/20/2012 4:14 PM

## 2012-06-20 NOTE — Tx Team (Signed)
Interdisciplinary Treatment Plan Update   Date Reviewed:  06/20/2012  Time Reviewed:  10:43 AM  Progress in Treatment:   Attending groups: Yes Participating in groups: Yes Taking medication as prescribed: Yes  Tolerating medication: Yes Family/Significant other contact made: No, but will request consent for collateral contact. Patient understands diagnosis: Yes  Discussing patient identified problems/goals with staff: Yes Medical problems stabilized or resolved: Yes Denies suicidal/homicidal ideation: Yes Patient has not harmed self or others: Yes  For review of initial/current patient goals, please see plan of care.  Estimated Length of Stay:  2-3 days  Reasons for Continued Hospitalization:  Anxiety Depression Medication stabilization  New Problems/Goals identified:    Discharge Plan or Barriers:   Home with outpatient follow up  Additional Comments:  Patient reports admitting to hospital with depression.  She endorses HI but would not state who. Patient denies SI.  She is requesting referral for residential treatment at discharge.    Attendees:  Patient:  06/20/2012 10:43 AM   Signature: Patrick North, MD 06/20/2012 10:43 AM  Signature:  Roswell Miners, RN 06/20/2012 10:43 AM  Signature:  06/20/2012 10:43 AM  Signature:Beverly Terrilee Croak, RN 06/20/2012 10:43 AM  Signature:  Neill Loft RN 06/20/2012 10:43 AM  Signature:  Juline Patch, LCSW 06/20/2012 10:43 AM  Signature: Silverio Decamp, PMH-NP 06/20/2012 10:43 AM  Signature:  06/20/2012 10:43 AM  Signature: 06/20/2012 10:43 AM  Signature:    Signature:    Signature:      Scribe for Treatment Team:   Juline Patch,  06/20/2012 10:43 AM
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
## 2012-06-20 NOTE — Progress Notes (Addendum)
D:  Patient's self inventory sheet, has fair sleep, improving appetite, low energy level, improving attention span.  Rated depression #3, hopelessness #8.  Withdrawals tremors, diarrhea, cravings.  SI off/on, contracts for safety.  Has been lightheaded and dizziness, blurred vision.  Pain goal #3, worst pain #3.  "I want to lose the depression because it causing me to have a nervous breakdown."  Stated she does see shadow.  Has HI to other but not at [facility].  Feels she cannot fix her life.   Does have discharge plans.  No problems taking meds after discharge. A:  Medications administered per MD ordeers.  Staff will monitor patient every 15 minutes for safety.   Emotional support and encouragement given patient. R:  SI off/on, contracts for safety.  HI toward others outside [facility].  Does see shadows.  Denied hearing voices.  Will discuss with MD her anxiety/problems.  Security gave ring to patient and bracelet was put in locker.

## 2012-06-20 NOTE — Progress Notes (Signed)
Adult Psychoeducational Group Note  Date:  06/20/2012 Time:  2:08 PM  Group Topic/Focus:  Wellness Toolbox:   The focus of this group is to discuss various aspects of wellness, balancing those aspects and exploring ways to increase the ability to experience wellness.  Patients will create a wellness toolbox for use upon discharge.  Participation Level:  Minimal  Participation Quality:  Attentive  Affect:  Appropriate  Cognitive:  Appropriate  Insight: Improving  Engagement in Group:  Limited  Modes of Intervention:  Discussion  Additional Comments:  Pt was appropriate but limited in her interactions. Pt stated that her Spiritual Goal was to become one with herself and that she wants to do better at keeping money  In her account for her Financial Goal.   [name] 06/20/2012, 2:08 PM

## 2012-06-20 NOTE — Progress Notes (Signed)
Psychoeducational Group Note  Date:  06/19/2012 Time:  2000  Group Topic/Focus:  Wrap-Up Group:   The focus of this group is to help patients review their daily goal of treatment and discuss progress on daily workbooks.  Participation Level: Did Not Attend  Participation Quality:  Not Applicable  Affect:  Not Applicable  Cognitive:  Not Applicable  Insight:  Not Applicable  Engagement in Group: Not Applicable  Additional Comments:  The patient was admitted to the unit after the group concluded.   Hazle Coca S 06/20/2012, 4:28 AM

## 2012-06-21 DIAGNOSIS — F259 Schizoaffective disorder, unspecified: Secondary | ICD-10-CM

## 2012-06-21 DIAGNOSIS — F3130 Bipolar disorder, current episode depressed, mild or moderate severity, unspecified: Secondary | ICD-10-CM

## 2012-06-21 MED ORDER — TRAZODONE HCL 100 MG PO TABS
100.0000 mg | ORAL_TABLET | Freq: Every evening | ORAL | Status: DC | PRN
  Administered 2012-06-21 – 2012-06-23 (×3): 100 mg via ORAL
  Filled 2012-06-21 (×3): qty 1
  Filled 2012-06-21: qty 28

## 2012-06-21 MED ORDER — LORATADINE 10 MG PO TABS
10.0000 mg | ORAL_TABLET | Freq: Every day | ORAL | Status: DC
  Administered 2012-06-21 – 2012-06-24 (×4): 10 mg via ORAL
  Filled 2012-06-21 (×6): qty 1
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
## 2012-06-21 NOTE — Progress Notes (Signed)
D:  Patient's self inventory sheet, patient has poor sleep, needs sleep medication,  Has improving appetite, low energy level, poor attention span.  Rated depression and anxiety #8, hopelessness #5.  Has experienced tremors, diarrhea in past 24 hours.  Pain goal today #5, worst pain #5.  Stated she does not want to do anything, wants to lay in bed.  Meds worked about one hr yesterday and then felt same depression, the same way she was feeling before the meds.  Still feels HI toward the same people, daughter, Trinna Post (friend).    Saw shadows running across floor last night.  Denied voices.  Needs financial assistance for meds.  "I don't see medicine is working." A:  Medications administered per MD orders.  Support and encouragement given patient throughout day. R:    Denied SI.  Denied auditory hallucinations.  Admits HI to others she knew before [facility] admission.  Does see shadows at night on floor.
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
## 2012-06-21 NOTE — Progress Notes (Signed)
Adult Psychoeducational Group Note  Date:  06/21/2012 Time:  6:32 PM  Group Topic/Focus:  Recovery Goals:   The focus of this group is to identify appropriate goals for recovery and establish a plan to achieve them.  Participation Level:  Active  Participation Quality:  Appropriate, Attentive and Sharing  Affect:  Appropriate  Cognitive:  Appropriate  Insight: Appropriate  Engagement in Group:  Engaged  Modes of Intervention:  Discussion  Additional Comments:  Pt was appropriate and sharing while attending group. Pt shared two things that are standing between her and recovery.   [name] 06/21/2012, 6:32 PM

## 2012-06-21 NOTE — Progress Notes (Signed)
Date: 06/21/2012  Time: 9:15 PM  Group Topic/Focus:  Wrap-Up Group: The focus of this group is to help patients review their daily goal of treatment and discuss progress on daily workbooks.  Participation Level: Active  Participation Quality: Appropriate, Sharing and Supportive  Affect: Appropriate  Cognitive: Appropriate  Insight: Appropriate  Engagement in Group: Engaged and Supportive  Modes of Intervention: Education, Problem-solving and Support  Additional Comments: pt was very cheerful tonight. Linda Sheppard M  06/21/2012, 9:15 PM

## 2012-06-21 NOTE — [facility] Group Notes (Signed)
[facility] LCSW Group Therapy      Feelings About Diagnosis 1:15 - 2:30 PM          06/21/2012 3:35 PM  Type of Therapy:  Group Therapy  Participation Level:  Patient came to group but did not remain for the discussion.  Wynn Banker 06/21/2012, 3:35 PM

## 2012-06-21 NOTE — Progress Notes (Signed)
Grief and Loss Group  ° °Pts participated in the group focused on loss, pts discussed mourning, the longevity of grief, and sources of coping and hope.  ° °Pt participated in group by being attentive to others who shared.  ° °Linda Sheppard °Counselor Intern °[employer] °

## 2012-06-22 MED ORDER — RISPERIDONE 0.5 MG PO TABS
0.5000 mg | ORAL_TABLET | Freq: Every day | ORAL | Status: DC
  Administered 2012-06-22 – 2012-06-23 (×2): 0.5 mg via ORAL
  Filled 2012-06-22: qty 14
  Filled 2012-06-22 (×3): qty 1
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
## 2012-06-22 NOTE — Progress Notes (Signed)
Recreation Therapy Notes  Date: 04.16.2014  Time: 3:00pm Location: [address] Room   Group Topic/Focus: Decision Making   Participation Level:  Did not attend  Jearl Klinefelter, LRT/CTRS  Jearl Klinefelter 06/22/2012 4:38 PM

## 2012-06-22 NOTE — [facility] Suicide Risk Assessment (Signed)
[facility] INPATIENT:  Family/Significant Other Suicide Prevention Education  Suicide Prevention Education:  Education Completed Linda Sheppard, Brother, [phone]) has been identified by the patient as the family member/significant other with whom the patient will be residing, and identified as the person(s) who will aid the patient in the event of a mental health crisis (suicidal ideations/suicide attempt).  With written consent from the patient, the family member/significant other has been provided the following suicide prevention education, prior to the and/or following the discharge of the patient.  The suicide prevention education provided includes the following:  Suicide risk factors  Suicide prevention and interventions  National Suicide Hotline telephone number  [facility] assessment telephone number  [facility] Emergency Assistance 911  [facility] and/or Residential Mobile Crisis Unit telephone number  Request made of family/significant other to:  Remove weapons (e.g., guns, rifles, knives), all items previously/currently identified as safety concern.  Brother reports she will secure patient's gun.  Remove drugs/medications (over-the-counter, prescriptions, illicit drugs), all items previously/currently identified as a safety concern.  The family member/significant other verbalizes understanding of the suicide prevention education information provided.  The family member/significant other agrees to remove the items of safety concern listed above.  Linda Sheppard 06/22/2012, 3:43 PM

## 2012-06-22 NOTE — Progress Notes (Signed)
Adult Psychoeducational Group Note  Date:  06/22/2012 Time:  12:28 PM  Group Topic/Focus:  Stages of Change:   The focus of this group is to explain the stages of change and help patients identify changes they want to make upon discharge.  Participation Level:  Active  Participation Quality:  Appropriate and Attentive  Affect:  Appropriate and Excited  Cognitive:  Alert and Oriented  Insight: Appropriate and Good  Engagement in Group:  Engaged and None  Modes of Intervention:  Activity, Discussion, Education and Support  Additional Comments:  PT is motivated for change and feels that she is finding coping skills for mood regulation.  Goddess Gebbia T 06/22/2012, 12:28 PM
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
## 2012-06-22 NOTE — Progress Notes (Signed)
D: Patient denies SI/HI and A/V hallucinations; patient reports sleep is poor; reports appetite is improving ; reports energy level is low ; reports ability to pay attention is poor; rates depression as 8/10; rates hopelessness 8/10; rates anxiety as 5/10; complaints of seeing shadow and not resting well  A: Monitored q 15 minutes; patient encouraged to attend groups; patient educated about medications; patient given medications per physician orders; patient encouraged to express feelings and/or concerns  R: Patient is animated and bright on the unit and appropriate to circumstance; patient's interaction with staff and peers is appropriate; ; patient is taking medications as prescribed and tolerating medications; patient is attending all groups

## 2012-06-23 DIAGNOSIS — F329 Major depressive disorder, single episode, unspecified: Secondary | ICD-10-CM

## 2012-06-23 DIAGNOSIS — F1910 Other psychoactive substance abuse, uncomplicated: Secondary | ICD-10-CM

## 2012-06-23 MED ORDER — DULOXETINE HCL 30 MG PO CPEP
30.0000 mg | ORAL_CAPSULE | Freq: Two times a day (BID) | ORAL | Status: DC
  Administered 2012-06-23 – 2012-06-24 (×2): 30 mg via ORAL
  Filled 2012-06-23: qty 1
  Filled 2012-06-23: qty 28
  Filled 2012-06-23 (×2): qty 1
  Filled 2012-06-23: qty 28
  Filled 2012-06-23: qty 1
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
## 2012-06-23 NOTE — Progress Notes (Signed)
Adult Psychoeducational Group Note  Date:  06/23/2012 Time:  7:09 PM  Group Topic/Focus:  Self Esteem Action Plan:   The focus of this group is to help patients create a plan to continue to build self-esteem after discharge.  Participation Level:  Active  Participation Quality:  Appropriate, Attentive and Sharing  Affect:  Appropriate  Cognitive:  Appropriate  Insight: Appropriate and Good  Engagement in Group:  Engaged  Modes of Intervention:  Clarification, Discussion, Education and Support  Additional Comments: Tytionna attended group and participated in self esteem action plan. Patient defined self esteem in own terms and completed worksheet in workbook on what patient liked about self and things patient enjoyed doing. Patient shared with peers information that was written on the worksheet.  Karleen Hampshire Brittini 06/23/2012, 7:09 PM

## 2012-06-23 NOTE — Progress Notes (Signed)
Adult Psychoeducational Group Note  Date:  06/23/2012 Time:  10:39 PM  Group Topic/Focus:  Wrap-Up Group:   Wrap up group   Additional Comments:Pt attended and participated in wrap up group  Meredith Staggers 06/23/2012, 10:39 PM

## 2012-06-23 NOTE — [facility] Group Notes (Signed)
[facility] LCSW Group Therapy              [facility] 1:15 - 2:30  06/23/2012 4:16 PM  Type of Therapy:  Group Therapy  Participation Level:  Minimal  Participation Quality:  Appropriate  Affect:  Appropriate and Depressed  Cognitive:  Appropriate  Insight:  Engaged  Engagement in Therapy:  Engaged  Modes of Intervention:  Discussion, Education, Exploration, Problem-Solving, Rapport, Support  Summary of Progress/Problems:  Patient listened attentively to speaker from [facility] but made no comments.  Wynn Banker 06/23/2012, 4:16 PM
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
## 2012-06-23 NOTE — BHH Group Notes (Signed)
[facility] LCSW Aftercare Discharge Planning Group Note   06/23/2012 11:10 AM  Participation Quality:  Appropriate  Mood/Affect:  Depressed and Flat  Depression Rating:  9  Anxiety Rating:  9  Thoughts of Suicide:  No  Will you contract for safety?   NA  Current AVH:  No  Plan for Discharge/Comments:  Patient reports not doing well today.  She shared she is feels as though she has crashed and does not believe she is ready to discharge home.  She was informed Clinical research associate spoke with brother on 06/22/12.  Transportation Means: Patient uses public transportation.  Supports: Limited support  Kiam Bransfield, [employer]

## 2012-06-23 NOTE — Progress Notes (Signed)
D: Patient denies SI/HI and auditory and visual hallucinations. The patient has an anxious mood and affect. The patient rates her depression an 8 out of 10 and her hopelessness a 0 out of 10 (1 low/10 high). The patient reports sleeping fairly well and states that her appetite is good and that her energy level is normal. The patient is attending groups and interacting appropriately within the milieu.   A: Patient given emotional support from RN. Patient encouraged to come to staff with concerns and/or questions. Patient's medication routine continued. Patient's orders and plan of care reviewed.  R: Patient remains cooperative. Will continue to monitor patient q15 minutes for safety.

## 2012-06-24 DIAGNOSIS — F1010 Alcohol abuse, uncomplicated: Secondary | ICD-10-CM

## 2012-06-24 MED ORDER — GABAPENTIN 100 MG PO CAPS: 100.0000 mg | ORAL_CAPSULE | Freq: Three times a day (TID) | ORAL | Status: DC

## 2012-06-24 MED ORDER — RISPERIDONE 0.5 MG PO TABS: 0.5000 mg | ORAL_TABLET | Freq: Every day | ORAL | Status: DC

## 2012-06-24 MED ORDER — TRAZODONE HCL 100 MG PO TABS: 100.0000 mg | ORAL_TABLET | Freq: Every evening | ORAL | Status: DC | PRN

## 2012-06-24 MED ORDER — DULOXETINE HCL 30 MG PO CPEP: 30.0000 mg | ORAL_CAPSULE | Freq: Two times a day (BID) | ORAL | Status: DC

## 2012-06-24 MED ORDER — NABUMETONE 500 MG PO TABS: 500.0000 mg | ORAL_TABLET | Freq: Two times a day (BID) | ORAL | Status: DC

## 2012-06-24 NOTE — [facility] Suicide Risk Assessment (Signed)
Suicide Risk Assessment  Discharge Assessment     Demographic Factors:  Female African american  Mental Status Per Nursing Assessment::   On Admission:  Suicidal ideation indicated by patient;Self-harm thoughts;Self-harm behaviors;Thoughts of violence towards others;Plan to harm others  Current Mental Status by Physician: Alert and oriented to 4. Denies AH/VH/SI/HI.  Loss Factors: Loss of significant relationship  Historical Factors: Impulsivity  Risk Reduction Factors:   Sense of responsibility to family, Living with another person, especially a relative and Positive coping skills or problem solving skills  Continued Clinical Symptoms:  Depression:   Recent sense of peace/wellbeing  Cognitive Features That Contribute To Risk:  Cognitively intact   Suicide Risk:  Minimal: No identifiable suicidal ideation.  Patients presenting with no risk factors but with morbid ruminations; may be classified as minimal risk based on the severity of the depressive symptoms  Discharge Diagnoses:   AXIS I:  Alcohol Abuse and Major Depression, Recurrent severe AXIS II:  Deferred AXIS III:   Past Medical History  Diagnosis Date  . Obesity   . Hypertension   . Polysubstance abuse   . Bipolar 1 disorder   . Schizophrenia   . Depression   . Anxiety    AXIS IV:  other psychosocial or environmental problems AXIS V:  61-70 mild symptoms  Plan Of Care/Follow-up recommendations:  Activity:  As tolerated Diet:  regular Follow up with outp[atient appointments.  Is patient on multiple antipsychotic therapies at discharge:  No   Has Patient had three or more failed trials of antipsychotic monotherapy by history:  No  Recommended Plan for Multiple Antipsychotic Therapies: NA  Linda Sheppard 06/24/2012, 9:26 AM

## 2012-06-24 NOTE — Progress Notes (Signed)
Pt discharged per MD orders; pt currently denies SI/HI and auditory/visual hallucinations; pt was given education by RN regarding follow-up appointments and medications and pt denied any questions or concerns about these instructions; pt was then escorted to search room to retrieve her belongings by RN before being discharged to hospital lobby. 

## 2012-06-24 NOTE — Progress Notes (Signed)
[facility] Adult Case Management Discharge Plan :  Will you be returning to the same living situation after discharge: Yes,  home with children At discharge, do you have transportation home?:Yes,  either family from [place] or possible bus pass Do you have the ability to pay for your medications:Yes,  no barriers   Release of information consent forms completed and in the chart;  Patient's signature needed at discharge.  Patient to Follow up at: Follow-up Information   Follow up with [facility] On 06/27/2012. (Please go to [facility]'s walk in clinic on Monday, June 27, 2012 or any weekday between 8AM-3PM for medication managment.)    Contact information:   [address] [place], [place]   [address]  [phone]      Patient denies SI/HI:   Yes,  no reports in morning group.    Safety Planning and Suicide Prevention discussed:  Yes,  completed patient and brother  Nail, Catalina Gravel 06/24/2012, 9:53 AM

## 2012-06-24 NOTE — BHH Group Notes (Signed)
[facility] LCSW Aftercare Discharge Planning Group Note   06/24/2012 10:14 AM  Participation Quality:  Active and Engaged/ Supportive of all members  Mood/Affect:  Appropriate and Excited  Happy about leaving today.(Smiling)  Depression Rating:  7 (reports this is her baseline)  Anxiety Rating:  7 (baseline)  Thoughts of Suicide:  No Will you contract for safety?   Yes  Current AVH:  No  Plan for Discharge/Comments:  Patient to DC home today and will be picked up by family and may need assistance getting to her car that is at [facility]. Aftercare is set up with [facility] and patient aware.  Patient very happy and bright this morning. Reports this is her best and she has returned to baseline. She compares how she came in and the progress has made by relating to other members, support at [facility], and learning coping skills.  Transportation Means: Bus, security or family  Supports:  Daughters, family, and friends.  Nail, Catalina Gravel

## 2012-06-24 NOTE — BHH Group Notes (Signed)
[facility] LCSW Group Therapy  06/24/2012 3:15 PM  Type of Therapy:  Group Therapy  Participation Level:  Did Not Attend    Linda Sheppard, Linda Sheppard 06/24/2012, 3:15 PM
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
## 2012-06-24 NOTE — Progress Notes (Signed)
Patient ID: Linda Sheppard, female   DOB: 1975/11/28, 37 y.o.   MRN: [id number] D: Pt. Interacting on the unit. Reports meds were increased and that made her feel a lot better. "I'm ready to go home now and go grocery shopping". A: Pt. Encouraged to go to karaoke, Pt. Will be monitored q66min for safety. R: Pt. Attended karaoke and notes she enjoyed it. Pt. Is safe on the unit.

## 2012-06-24 NOTE — Progress Notes (Signed)
Adult Psychoeducational Group Note  Date:  06/24/2012 Time:  12:22 PM  Group Topic/Focus:  The group focused on labels. The group discussed the topic and did an activity that consisted of exploring the idea of labels in society.  Participation Level:  Active  Participation Quality:  Monopolizing and Redirectable  Affect:  Excited  Cognitive:  Alert and Appropriate  Insight: Good  Engagement in Group:  Engaged and Monopolizing  Modes of Intervention:  Activity, Discussion and Education  Additional Comments:  PT Talked about her goal for the day and participated in group. She stated she is looking forward to discharge.  Oluwanifemi Susman T 06/24/2012, 12:22 PM

## 2012-06-29 NOTE — Progress Notes (Signed)
Patient Discharge Instructions:  After Visit Summary (AVS):   Faxed to:  06/29/12 Discharge Summary Note:   Faxed to:  06/29/12 Psychiatric Admission Assessment Note:   Faxed to:  06/29/12 Suicide Risk Assessment - Discharge Assessment:   Faxed to:  06/29/12 Faxed/Sent to the Next Level Care provider:  06/29/12 Faxed to [facility] @ [phone]  Jerelene Redden, 06/29/2012, 4:19 PM

## 2012-07-10 ENCOUNTER — Emergency Department (HOSPITAL_COMMUNITY)
Admission: EM | Admit: 2012-07-10 | Discharge: 2012-07-10 | Disposition: A | Discharge status: Home / Self Care | Payer: Self-pay | Attending: Emergency Medicine | Admitting: Emergency Medicine

## 2012-07-10 ENCOUNTER — Encounter (HOSPITAL_COMMUNITY): Payer: Self-pay | Admitting: Emergency Medicine

## 2012-07-10 DIAGNOSIS — F331 Major depressive disorder, recurrent, moderate: Secondary | ICD-10-CM

## 2012-07-10 DIAGNOSIS — F17200 Nicotine dependence, unspecified, uncomplicated: Secondary | ICD-10-CM | POA: Insufficient documentation

## 2012-07-10 DIAGNOSIS — I10 Essential (primary) hypertension: Secondary | ICD-10-CM | POA: Insufficient documentation

## 2012-07-10 DIAGNOSIS — F411 Generalized anxiety disorder: Secondary | ICD-10-CM | POA: Insufficient documentation

## 2012-07-10 DIAGNOSIS — R413 Other amnesia: Secondary | ICD-10-CM | POA: Insufficient documentation

## 2012-07-10 DIAGNOSIS — F339 Major depressive disorder, recurrent, unspecified: Secondary | ICD-10-CM | POA: Insufficient documentation

## 2012-07-10 DIAGNOSIS — Z79899 Other long term (current) drug therapy: Secondary | ICD-10-CM | POA: Insufficient documentation

## 2012-07-10 DIAGNOSIS — F22 Delusional disorders: Secondary | ICD-10-CM | POA: Insufficient documentation

## 2012-07-10 DIAGNOSIS — F209 Schizophrenia, unspecified: Secondary | ICD-10-CM | POA: Insufficient documentation

## 2012-07-10 DIAGNOSIS — R404 Transient alteration of awareness: Secondary | ICD-10-CM | POA: Insufficient documentation

## 2012-07-10 DIAGNOSIS — R630 Anorexia: Secondary | ICD-10-CM | POA: Insufficient documentation

## 2012-07-10 DIAGNOSIS — G4700 Insomnia, unspecified: Secondary | ICD-10-CM | POA: Insufficient documentation

## 2012-07-10 LAB — ETHANOL: Alcohol, Ethyl (B): 122 mg/dL — ABNORMAL HIGH (ref 0–11)

## 2012-07-10 LAB — ACETAMINOPHEN LEVEL: Acetaminophen (Tylenol), Serum: <15 ug/mL (ref 10–30)

## 2012-07-10 LAB — SALICYLATE LEVEL: Salicylate Lvl: <2 mg/dL — ABNORMAL LOW (ref 2.8–20.0)

## 2012-07-10 LAB — CBC
MCH: 26.3 pg (ref 26.0–34.0)
MCHC: 34.2 g/dL (ref 30.0–36.0)
MCV: 77 fL — ABNORMAL LOW (ref 78.0–100.0)
Platelets: 226 10*3/uL (ref 150–400)
RDW: 14 % (ref 11.5–15.5)

## 2012-07-10 LAB — RAPID URINE DRUG SCREEN, HOSP PERFORMED
Amphetamines: NOT DETECTED
Benzodiazepines: NOT DETECTED
Opiates: NOT DETECTED

## 2012-07-10 LAB — COMPREHENSIVE METABOLIC PANEL
AST: 18 U/L (ref 0–37)
Albumin: 3.3 g/dL — ABNORMAL LOW (ref 3.5–5.2)
BUN: 8 mg/dL (ref 6–23)
Calcium: 8.5 mg/dL (ref 8.4–10.5)
Creatinine, Ser: 0.79 mg/dL (ref 0.50–1.10)

## 2012-07-10 MED ORDER — ARIPIPRAZOLE 5 MG PO TABS: 5.0000 mg | ORAL_TABLET | Freq: Every day | ORAL | Status: DC

## 2012-07-10 MED ORDER — NABUMETONE 500 MG PO TABS
500.0000 mg | ORAL_TABLET | Freq: Two times a day (BID) | ORAL | Status: DC
  Administered 2012-07-10: 500 mg via ORAL
  Filled 2012-07-10 (×2): qty 1

## 2012-07-10 MED ORDER — GABAPENTIN 300 MG PO CAPS: 600.0000 mg | ORAL_CAPSULE | Freq: Every day | ORAL | Status: DC

## 2012-07-10 MED ORDER — GABAPENTIN 100 MG PO CAPS
100.0000 mg | ORAL_CAPSULE | Freq: Three times a day (TID) | ORAL | Status: DC
  Administered 2012-07-10: 100 mg via ORAL
  Filled 2012-07-10 (×3): qty 1

## 2012-07-10 MED ORDER — NICOTINE 21 MG/24HR TD PT24
21.0000 mg | MEDICATED_PATCH | Freq: Every day | TRANSDERMAL | Status: DC
Start: 2012-07-10 — End: 2012-07-10
  Filled 2012-07-10: qty 1

## 2012-07-10 MED ORDER — RISPERIDONE 0.5 MG PO TABS: 0.5000 mg | ORAL_TABLET | Freq: Every day | ORAL | Status: DC

## 2012-07-10 MED ORDER — TRAZODONE HCL 100 MG PO TABS: 100.0000 mg | ORAL_TABLET | Freq: Every evening | ORAL | Status: DC | PRN

## 2012-07-10 MED ORDER — DULOXETINE HCL 30 MG PO CPEP
30.0000 mg | ORAL_CAPSULE | Freq: Two times a day (BID) | ORAL | Status: DC
  Administered 2012-07-10: 30 mg via ORAL
  Filled 2012-07-10 (×2): qty 1

## 2012-07-10 MED ORDER — ALUM & MAG HYDROXIDE-SIMETH 200-200-20 MG/5ML PO SUSP: 30.0000 mL | ORAL | Status: DC | PRN

## 2012-07-10 MED ORDER — ZOLPIDEM TARTRATE 5 MG PO TABS: 5.0000 mg | ORAL_TABLET | Freq: Every evening | ORAL | Status: DC | PRN

## 2012-07-10 MED ORDER — ONDANSETRON HCL 4 MG PO TABS
4.0000 mg | ORAL_TABLET | Freq: Three times a day (TID) | ORAL | Status: DC | PRN
  Administered 2012-07-10: 4 mg via ORAL
  Filled 2012-07-10: qty 1

## 2012-07-10 MED ORDER — IBUPROFEN 600 MG PO TABS
600.0000 mg | ORAL_TABLET | Freq: Three times a day (TID) | ORAL | Status: DC | PRN
  Administered 2012-07-10: 600 mg via ORAL
  Filled 2012-07-10: qty 1

## 2012-07-10 NOTE — ED Notes (Signed)
Pt still can not void,  Is drinking fluids well

## 2012-07-10 NOTE — ED Notes (Signed)
telepsych in progress 

## 2012-07-10 NOTE — ED Notes (Addendum)
Written dc instructions and medication changes reviewed w/ pt. Pt verbalized understanding.  Belongings returned after pt left the unit.

## 2012-07-10 NOTE — ED Notes (Addendum)
Nausea resolved, has improved

## 2012-07-10 NOTE — ED Notes (Signed)
Pt states that ever since she came home from behavioral health she has been unable to sleep or eat. Admits to drinking two mixed drinks tonight. Pt also states she is having memory lapses.

## 2012-07-10 NOTE — ED Notes (Signed)
Sitting on the bed, c/o ha and nausea

## 2012-07-10 NOTE — ED Notes (Signed)
Pt now states she is hearing whispers.

## 2012-07-10 NOTE — ED Notes (Signed)
Pt continues to dose off, after being asked repeatedly to wake up and don gown for movement to psych ED

## 2012-07-10 NOTE — ED Provider Notes (Signed)
History     CSN: [id number]  Arrival date & time 07/10/12  4098   First MD Initiated Contact with Patient 07/10/12 [phone]      Chief Complaint  Patient presents with  . Depression    (Consider location/radiation/quality/duration/timing/severity/associated sxs/prior treatment) HPI 37 yo female presents to the ER from home with complaint of persistent worsening depression, short term memory loss, insomnia, loss of appetite, and paranoia.  Pt was [facility] weeks ago from [facility].  She reports the medications help her during the day, but at night she "crashes" and suddenly feels very down and paranoid. She cannot remember small details and is constantly losing things.  She has no appetite, reports 8 lb weight loss in one week.  She denies SI/HI.  Reports to nurse she is having auditory hallucinations.  Past Medical History  Diagnosis Date  . Obesity   . Hypertension   . Polysubstance abuse   . Bipolar 1 disorder   . Schizophrenia   . Depression   . Anxiety     Past Surgical History  Procedure Laterality Date  . Tubal ligation    . Cesarean section      History reviewed. No pertinent family history.  History  Substance Use Topics  . Smoking status: Current Every Day Smoker -- 0.15 packs/day    Types: Cigarettes  . Smokeless tobacco: Not on file  . Alcohol Use: Yes     Comment: daily 6-7 24 oz beers    OB History   Grav Para Term Preterm Abortions TAB SAB Ect Mult Living                  Review of Systems  All other systems reviewed and are negative.    Allergies  Tramadol and Ceftin  Home Medications   Current Outpatient Rx  Name  Route  Sig  Dispense  Refill  . DULoxetine (CYMBALTA) 30 MG capsule   Oral   Take 1 capsule (30 mg total) by mouth 2 (two) times daily. For Depression/Pain   60 capsule   0   . risperiDONE (RISPERDAL) 0.5 MG tablet   Oral   Take 1 tablet (0.5 mg total) by mouth at bedtime. For mood control   30 tablet   0   . traZODone (DESYREL)  100 MG tablet   Oral   Take 1 tablet (100 mg total) by mouth at bedtime as needed for sleep (May repeat x1). For sleep and depression   30 tablet   0   . gabapentin (NEURONTIN) 100 MG capsule   Oral   Take 1 capsule (100 mg total) by mouth 3 (three) times daily. For anxiety and pain   90 capsule   0   . nabumetone (RELAFEN) 500 MG tablet   Oral   Take 1 tablet (500 mg total) by mouth 2 (two) times daily. For pain   60 tablet   0     Ht 5\' 5"  (1.651 m)  Wt 302 lb (136.986 kg)  BMI 50.26 kg/m2  LMP 06/26/2012  Physical Exam  Nursing note and vitals reviewed. Constitutional: She is oriented to person, place, and time. She appears well-developed and well-nourished.  Morbid obesity, somnolence  HENT:  Head: Normocephalic and atraumatic.  Nose: Nose normal.  Mouth/Throat: Oropharynx is clear and moist.  Eyes: Conjunctivae and EOM are normal. Pupils are equal, round, and reactive to light.  Neck: Normal range of motion. Neck supple. No JVD present. No tracheal deviation present. No thyromegaly  present.  Cardiovascular: Normal rate, regular rhythm, normal heart sounds and intact distal pulses.  Exam reveals no gallop and no friction rub.   No murmur heard. Pulmonary/Chest: Effort normal and breath sounds normal. No stridor. No respiratory distress. She has no wheezes. She has no rales. She exhibits no tenderness.  Abdominal: Soft. Bowel sounds are normal. She exhibits no distension and no mass. There is no tenderness. There is no rebound and no guarding.  Musculoskeletal: Normal range of motion. She exhibits no edema and no tenderness.  Lymphadenopathy:    She has no cervical adenopathy.  Neurological: She is alert and oriented to person, place, and time. She exhibits normal muscle tone. Coordination normal.  Skin: Skin is warm and dry. No rash noted. No erythema. No pallor.  Psychiatric: Her behavior is normal. Judgment and thought content normal.  Flat affect, depressed mood     ED Course  Procedures (including critical care time)  Labs Reviewed  ACETAMINOPHEN LEVEL  CBC  COMPREHENSIVE METABOLIC PANEL  ETHANOL  SALICYLATE LEVEL  URINE RAPID DRUG SCREEN (HOSP PERFORMED)   No results found.   1. Major depressive disorder, recurrent episode, moderate   2. Short-term memory loss       MDM  37 yo female with recent d/c from [facility] with persistent symptoms.  Will get clearance labs, place in psych area for telepsych evaluation.        Olivia Mackie, MD 07/10/12 [phone]

## 2012-07-13 ENCOUNTER — Emergency Department (HOSPITAL_COMMUNITY)
Admission: EM | Admit: 2012-07-13 | Discharge: 2012-07-13 | Disposition: A | Discharge status: Other Acute Inpatient Hospital | Payer: Self-pay | Attending: Emergency Medicine | Admitting: Emergency Medicine

## 2012-07-13 ENCOUNTER — Inpatient Hospital Stay (HOSPITAL_COMMUNITY)
Admission: EM | Admit: 2012-07-13 | Discharge: 2012-07-18 | DRG: 885 | Disposition: A | Discharge status: Home / Self Care | Payer: Federal, State, Local not specified - Other | Source: Intra-hospital | Attending: Psychiatry | Admitting: Psychiatry

## 2012-07-13 ENCOUNTER — Encounter (HOSPITAL_COMMUNITY): Payer: Self-pay | Admitting: Emergency Medicine

## 2012-07-13 DIAGNOSIS — T431X4A Poisoning by monoamine-oxidase-inhibitor antidepressants, undetermined, initial encounter: Secondary | ICD-10-CM | POA: Insufficient documentation

## 2012-07-13 DIAGNOSIS — F332 Major depressive disorder, recurrent severe without psychotic features: Principal | ICD-10-CM | POA: Diagnosis present

## 2012-07-13 DIAGNOSIS — F411 Generalized anxiety disorder: Secondary | ICD-10-CM | POA: Insufficient documentation

## 2012-07-13 DIAGNOSIS — G8929 Other chronic pain: Secondary | ICD-10-CM

## 2012-07-13 DIAGNOSIS — M549 Dorsalgia, unspecified: Secondary | ICD-10-CM | POA: Diagnosis present

## 2012-07-13 DIAGNOSIS — I10 Essential (primary) hypertension: Secondary | ICD-10-CM | POA: Insufficient documentation

## 2012-07-13 DIAGNOSIS — Y9389 Activity, other specified: Secondary | ICD-10-CM | POA: Insufficient documentation

## 2012-07-13 DIAGNOSIS — F209 Schizophrenia, unspecified: Secondary | ICD-10-CM | POA: Insufficient documentation

## 2012-07-13 DIAGNOSIS — Y92009 Unspecified place in unspecified non-institutional (private) residence as the place of occurrence of the external cause: Secondary | ICD-10-CM | POA: Insufficient documentation

## 2012-07-13 DIAGNOSIS — F319 Bipolar disorder, unspecified: Secondary | ICD-10-CM | POA: Insufficient documentation

## 2012-07-13 DIAGNOSIS — F17200 Nicotine dependence, unspecified, uncomplicated: Secondary | ICD-10-CM | POA: Insufficient documentation

## 2012-07-13 DIAGNOSIS — T43201A Poisoning by unspecified antidepressants, accidental (unintentional), initial encounter: Secondary | ICD-10-CM | POA: Insufficient documentation

## 2012-07-13 DIAGNOSIS — F331 Major depressive disorder, recurrent, moderate: Secondary | ICD-10-CM | POA: Insufficient documentation

## 2012-07-13 DIAGNOSIS — Z3202 Encounter for pregnancy test, result negative: Secondary | ICD-10-CM | POA: Insufficient documentation

## 2012-07-13 DIAGNOSIS — Z79899 Other long term (current) drug therapy: Secondary | ICD-10-CM

## 2012-07-13 DIAGNOSIS — R45851 Suicidal ideations: Secondary | ICD-10-CM

## 2012-07-13 DIAGNOSIS — E669 Obesity, unspecified: Secondary | ICD-10-CM | POA: Insufficient documentation

## 2012-07-13 DIAGNOSIS — F1910 Other psychoactive substance abuse, uncomplicated: Secondary | ICD-10-CM | POA: Insufficient documentation

## 2012-07-13 LAB — COMPREHENSIVE METABOLIC PANEL
AST: 29 U/L (ref 0–37)
BUN: 8 mg/dL (ref 6–23)
CO2: 24 mEq/L (ref 19–32)
Calcium: 8.7 mg/dL (ref 8.4–10.5)
Chloride: 99 mEq/L (ref 96–112)
Creatinine, Ser: 1.06 mg/dL (ref 0.50–1.10)
GFR calc Af Amer: 78 mL/min — ABNORMAL LOW (ref >90)
GFR calc non Af Amer: 67 mL/min — ABNORMAL LOW (ref >90)
Total Bilirubin: 0.4 mg/dL (ref 0.3–1.2)

## 2012-07-13 LAB — CBC WITH DIFFERENTIAL/PLATELET
Basophils Absolute: 0 10*3/uL (ref 0.0–0.1)
Eosinophils Relative: 0 % (ref 0–5)
HCT: 36.3 % (ref 36.0–46.0)
Hemoglobin: 12.9 g/dL (ref 12.0–15.0)
Lymphocytes Relative: 16 % (ref 12–46)
MCHC: 35.5 g/dL (ref 30.0–36.0)
MCV: 75.5 fL — ABNORMAL LOW (ref 78.0–100.0)
Monocytes Absolute: 0.8 10*3/uL (ref 0.1–1.0)
Monocytes Relative: 8 % (ref 3–12)
Neutro Abs: 7.9 10*3/uL — ABNORMAL HIGH (ref 1.7–7.7)
RDW: 13.6 % (ref 11.5–15.5)
WBC: 10.4 10*3/uL (ref 4.0–10.5)

## 2012-07-13 LAB — RAPID URINE DRUG SCREEN, HOSP PERFORMED
Cocaine: POSITIVE — AB
Opiates: NOT DETECTED

## 2012-07-13 LAB — SALICYLATE LEVEL: Salicylate Lvl: <2 mg/dL — ABNORMAL LOW (ref 2.8–20.0)

## 2012-07-13 LAB — ETHANOL: Alcohol, Ethyl (B): <11 mg/dL (ref 0–11)

## 2012-07-13 LAB — PREGNANCY, URINE: Preg Test, Ur: NEGATIVE

## 2012-07-13 MED ORDER — SODIUM CHLORIDE 0.9 % IV BOLUS (SEPSIS)
1000.0000 mL | Freq: Once | INTRAVENOUS | Status: AC
Start: 2012-07-13 — End: 2012-07-13
  Administered 2012-07-13: 1000 mL via INTRAVENOUS

## 2012-07-13 MED ORDER — ONDANSETRON HCL 4 MG PO TABS: 4.0000 mg | ORAL_TABLET | Freq: Three times a day (TID) | ORAL | Status: DC | PRN

## 2012-07-13 MED ORDER — HYDROCHLOROTHIAZIDE 25 MG PO TABS
25.0000 mg | ORAL_TABLET | Freq: Every day | ORAL | Status: DC
Start: 2012-07-13 — End: 2012-07-13
  Administered 2012-07-13: 25 mg via ORAL
  Filled 2012-07-13: qty 1

## 2012-07-13 MED ORDER — IBUPROFEN 600 MG PO TABS: 600.0000 mg | ORAL_TABLET | Freq: Three times a day (TID) | ORAL | Status: DC | PRN

## 2012-07-13 MED ORDER — ACETAMINOPHEN 325 MG PO TABS: 650.0000 mg | ORAL_TABLET | ORAL | Status: DC | PRN

## 2012-07-13 MED ORDER — GABAPENTIN 300 MG PO CAPS
600.0000 mg | ORAL_CAPSULE | Freq: Every day | ORAL | Status: DC
  Administered 2012-07-13: 600 mg via ORAL
  Filled 2012-07-13: qty 2

## 2012-07-13 MED ORDER — ARIPIPRAZOLE 5 MG PO TABS
5.0000 mg | ORAL_TABLET | Freq: Every day | ORAL | Status: DC
  Administered 2012-07-13: 5 mg via ORAL
  Filled 2012-07-13: qty 1

## 2012-07-13 MED ORDER — DULOXETINE HCL 30 MG PO CPEP
30.0000 mg | ORAL_CAPSULE | Freq: Two times a day (BID) | ORAL | Status: DC
  Administered 2012-07-13: 30 mg via ORAL
  Filled 2012-07-13: qty 1

## 2012-07-13 NOTE — ED Notes (Signed)
[id number]> Expected date:<BR> Expected time:<BR> Means of arrival:<BR> Comments:<BR> Room 24

## 2012-07-13 NOTE — ED Notes (Signed)
Per EMS:  Pt's son called 911.   Pt admitted suicidal ideations to her 37 year old daughter, but did not admit to EMS.  Pt stated that she took 1 trazodone pills (100mg  pill) at 3am and 4 at 6:30am.  EMS smelled alcohol on breath.  Pt discharged from [facility] about 3 weeks ago.  EMS found pt to be alert, but drowsy.  Pt had 5 bouts of emesis en route.  22 PIV in left hand.  Pt started on fluid bolus due to SBP of 78.  After bolus, SBP 118.

## 2012-07-13 NOTE — ED Notes (Signed)
Pt c/o nausea, asking for ginger ale. States she wasn't trying to kill herself, states she was just trying to sleep. Denies SI. C/o weakness and sleepiness at present. States she hasn't felt suicidal since discharged from [facility]

## 2012-07-13 NOTE — ED Provider Notes (Signed)
History     CSN: [id number]  Arrival date & time 07/13/12  0805   First MD Initiated Contact with Patient 07/13/12 [phone]      Chief Complaint  Patient presents with  . Drug Overdose    Trazodone    (Consider location/radiation/quality/duration/timing/severity/associated sxs/prior treatment) Patient is a 37 y.o. female presenting with Overdose. The history is provided by the patient.  Drug Overdose This is a new problem. Pertinent negatives include no chest pain, no abdominal pain, no headaches and no shortness of breath.   patient overdosed on trazodone. She states it was just go to sleep. She states that she took her normal tab at bedtime. She then took an extra tablet of 100 mg at 3 AM. She then took 4 more tablets at 6:30 AM. She also had been drinking. She denies suicide attempt. She is previous depression and previous suicide attempt. She states she is only sleeping. Past Medical History  Diagnosis Date  . Obesity   . Hypertension   . Polysubstance abuse   . Bipolar 1 disorder   . Schizophrenia   . Depression   . Anxiety     Past Surgical History  Procedure Laterality Date  . Tubal ligation    . Cesarean section      History reviewed. No pertinent family history.  History  Substance Use Topics  . Smoking status: Current Every Day Smoker -- 0.15 packs/day    Types: Cigarettes  . Smokeless tobacco: Not on file  . Alcohol Use: Yes     Comment: daily 6-7 24 oz beers    OB History   Grav Para Term Preterm Abortions TAB SAB Ect Mult Living                  Review of Systems  Constitutional: Negative for activity change and appetite change.  HENT: Negative for neck stiffness.   Eyes: Negative for pain.  Respiratory: Negative for chest tightness and shortness of breath.   Cardiovascular: Negative for chest pain and leg swelling.  Gastrointestinal: Negative for nausea, vomiting, abdominal pain and diarrhea.  Genitourinary: Negative for flank pain.   Musculoskeletal: Negative for back pain.  Skin: Negative for rash.  Neurological: Negative for weakness, numbness and headaches.  Psychiatric/Behavioral: Positive for dysphoric mood. Negative for behavioral problems.    Allergies  Tramadol and Ceftin  Home Medications   Current Outpatient Rx  Name  Route  Sig  Dispense  Refill  . DULoxetine (CYMBALTA) 30 MG capsule   Oral   Take 1 capsule (30 mg total) by mouth 2 (two) times daily. For Depression/Pain   60 capsule   0   . gabapentin (NEURONTIN) 300 MG capsule   Oral   Take 2 capsules (600 mg total) by mouth at bedtime. For anxiety and pain   60 capsule   0   . hydrochlorothiazide (HYDRODIURIL) 25 MG tablet   Oral   Take 25 mg by mouth daily.         . traZODone (DESYREL) 100 MG tablet   Oral   Take 1 tablet (100 mg total) by mouth at bedtime as needed for sleep (May repeat x1). For sleep and depression   30 tablet   0   . ARIPiprazole (ABILIFY) 5 MG tablet   Oral   Take 1 tablet (5 mg total) by mouth at bedtime.   30 tablet   0     BP 116/73  Pulse 94  Temp(Src) 98.2 F (36.8 C) (Oral)  Resp 18  SpO2 97%  LMP 06/26/2012  Physical Exam  Nursing note and vitals reviewed. Constitutional: She is oriented to person, place, and time. She appears well-developed and well-nourished.  Patient is obese.  HENT:  Head: Normocephalic and atraumatic.  Eyes: EOM are normal. Pupils are equal, round, and reactive to light.  Neck: Normal range of motion. Neck supple.  Cardiovascular: Normal rate, regular rhythm and normal heart sounds.   No murmur heard. Pulmonary/Chest: Effort normal and breath sounds normal. No respiratory distress. She has no wheezes. She has no rales.  Abdominal: Soft. Bowel sounds are normal. She exhibits no distension. There is no tenderness. There is no rebound and no guarding.  Musculoskeletal: Normal range of motion.  Neurological: She is alert and oriented to person, place, and time. No  cranial nerve deficit.  Patient sedated but appropriate.  Skin: Skin is warm and dry.  Psychiatric: She has a normal mood and affect. Her speech is normal.    ED Course  Procedures (including critical care time)  Labs Reviewed  CBC WITH DIFFERENTIAL - Abnormal; Notable for the following:    MCV 75.5 (*)    Neutro Abs 7.9 (*)    All other components within normal limits  COMPREHENSIVE METABOLIC PANEL - Abnormal; Notable for the following:    Albumin 3.4 (*)    GFR calc non Af Amer 67 (*)    GFR calc Af Amer 78 (*)    All other components within normal limits  URINE RAPID DRUG SCREEN (HOSP PERFORMED) - Abnormal; Notable for the following:    Cocaine POSITIVE (*)    All other components within normal limits  SALICYLATE LEVEL - Abnormal; Notable for the following:    Salicylate Lvl <2.0 (*)    All other components within normal limits  ETHANOL  ACETAMINOPHEN LEVEL  PREGNANCY, URINE   No results found.   1. Overdose, initial encounter   2. Major depressive disorder, recurrent episode, moderate       MDM  Patient with overdose on trazodone. Took 6 pills of 100 mg trazodone. Last dose was at 6:30 this morning. EKG does show some QT prolongation. After discussion with poison control they recommend serial EKG to prove it is going down. Repeat EKG after 4 hours had a staple QT. She still somewhat sedate, but is able to eat and drink. Will likely need inpatient psych treatment    Date: 07/13/2012  Rate: 81  Rhythm: normal sinus rhythm  QRS Axis: normal  Intervals: QT prolonged  ST/T Wave abnormalities: normal  Conduction Disutrbances:none  Narrative Interpretation:   Old EKG Reviewed: none available  EKGS show stable QTc 539 at 0815, 542 at 1107, 534 at 1511     Addiel Mccardle R. Rubin Payor, MD 07/13/12 [phone]

## 2012-07-13 NOTE — ED Notes (Signed)
Patient states she wanted some sleep and had not been getting sleep, so she took some trazadones to help her w/sleeping. She was not trying to hurt herself only get some much needed sleep.

## 2012-07-13 NOTE — BHH Counselor (Signed)
Pt referred to [facility]; pending review.  Pt also referred to [place] , [place], [facility], [address], and [place].  No beds at [place], [place], [facility] [address], [address], [place], [address], [facility] [address], [place], and [facility].
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
## 2012-07-13 NOTE — ED Notes (Signed)
[id number]> Expected date:<BR> Expected time:<BR> Means of arrival:<BR> Comments:<BR> Ems- OD trazodone, pysch

## 2012-07-13 NOTE — ED Notes (Signed)
Patient resting quietly at the beginning of this shift with her eyes closed. Respirations even and unlabored. No distress noted. Q 15 mintecheck continues as ordered to maintain safety.

## 2012-07-13 NOTE — ED Notes (Signed)
Spoke with Linda Sheppard at [facility]: reported labs and QT/ QTc--> second EKG QTc prolonged.  Linda Sheppard will check back around 5pm.  Reported to Dr. Rubin Payor.

## 2012-07-14 ENCOUNTER — Encounter (HOSPITAL_COMMUNITY): Payer: Self-pay | Admitting: Behavioral Health

## 2012-07-14 DIAGNOSIS — F1910 Other psychoactive substance abuse, uncomplicated: Secondary | ICD-10-CM

## 2012-07-14 DIAGNOSIS — F332 Major depressive disorder, recurrent severe without psychotic features: Principal | ICD-10-CM

## 2012-07-14 MED ORDER — DULOXETINE HCL 30 MG PO CPEP
30.0000 mg | ORAL_CAPSULE | Freq: Two times a day (BID) | ORAL | Status: DC
  Administered 2012-07-14 – 2012-07-18 (×9): 30 mg via ORAL
  Filled 2012-07-14: qty 6
  Filled 2012-07-14 (×3): qty 1
  Filled 2012-07-14: qty 6
  Filled 2012-07-14 (×3): qty 1
  Filled 2012-07-14: qty 6
  Filled 2012-07-14 (×2): qty 1
  Filled 2012-07-14 (×2): qty 28
  Filled 2012-07-14: qty 1
  Filled 2012-07-14: qty 6
  Filled 2012-07-14: qty 1

## 2012-07-14 MED ORDER — ARIPIPRAZOLE 5 MG PO TABS
5.0000 mg | ORAL_TABLET | Freq: Every day | ORAL | Status: DC
  Filled 2012-07-14: qty 1

## 2012-07-14 MED ORDER — MAGNESIUM HYDROXIDE 400 MG/5ML PO SUSP: 30.0000 mL | Freq: Every day | ORAL | Status: DC | PRN

## 2012-07-14 MED ORDER — QUETIAPINE FUMARATE 50 MG PO TABS
50.0000 mg | ORAL_TABLET | Freq: Every day | ORAL | Status: DC
  Administered 2012-07-14 – 2012-07-15 (×2): 50 mg via ORAL
  Filled 2012-07-14: qty 1
  Filled 2012-07-14: qty 3
  Filled 2012-07-14 (×2): qty 1

## 2012-07-14 MED ORDER — HYDROXYZINE HCL 50 MG PO TABS
50.0000 mg | ORAL_TABLET | Freq: Every evening | ORAL | Status: DC | PRN
  Administered 2012-07-14 – 2012-07-17 (×4): 50 mg via ORAL
  Filled 2012-07-14 (×2): qty 1
  Filled 2012-07-14: qty 6
  Filled 2012-07-14: qty 1
  Filled 2012-07-14: qty 6
  Filled 2012-07-14 (×2): qty 1
  Filled 2012-07-14: qty 28
  Filled 2012-07-14: qty 6
  Filled 2012-07-14: qty 28
  Filled 2012-07-14: qty 6
  Filled 2012-07-14 (×2): qty 1

## 2012-07-14 MED ORDER — ALUM & MAG HYDROXIDE-SIMETH 200-200-20 MG/5ML PO SUSP
30.0000 mL | ORAL | Status: DC | PRN
  Administered 2012-07-15 – 2012-07-18 (×4): 30 mL via ORAL

## 2012-07-14 MED ORDER — HYDROCHLOROTHIAZIDE 25 MG PO TABS
25.0000 mg | ORAL_TABLET | Freq: Every day | ORAL | Status: DC
  Administered 2012-07-14 – 2012-07-18 (×5): 25 mg via ORAL
  Filled 2012-07-14: qty 14
  Filled 2012-07-14 (×8): qty 1

## 2012-07-14 MED ORDER — GABAPENTIN 300 MG PO CAPS
600.0000 mg | ORAL_CAPSULE | Freq: Every day | ORAL | Status: DC
  Administered 2012-07-14 – 2012-07-17 (×4): 600 mg via ORAL
  Filled 2012-07-14: qty 2
  Filled 2012-07-14: qty 28
  Filled 2012-07-14: qty 6
  Filled 2012-07-14: qty 2
  Filled 2012-07-14: qty 6
  Filled 2012-07-14 (×2): qty 2

## 2012-07-14 MED ORDER — ACETAMINOPHEN 325 MG PO TABS
650.0000 mg | ORAL_TABLET | Freq: Four times a day (QID) | ORAL | Status: DC | PRN
Start: 2012-07-14 — End: 2012-07-18

## 2012-07-14 MED ORDER — NICOTINE 14 MG/24HR TD PT24
14.0000 mg | MEDICATED_PATCH | Freq: Every day | TRANSDERMAL | Status: DC
  Filled 2012-07-14 (×7): qty 1

## 2012-07-14 NOTE — Progress Notes (Signed)
  D) Patient resting quietly in bed upon my approach. Patient verbalizes "I have slept maybe 72 hours in the last 3 weeks, I just can't fall asleep." Patient states "I work days and nights then I try to be up to take care of my kids too." Patient verbalizes "I really need to get on something to help me sleep while I am here." Patient denies SI/HI, denies A/V hallucinations.   A) Patient offered support and encouragement, patient encouraged to discuss feelings/concerns with staff. Patient verbalized understanding. Patient monitored Q15 minutes for safety. Patient met with MD  to discuss today's goals and plan of care.  R) Patient visible in milieu, attending groups in day room and meals in dining room. Patient appropriate with staff and peers.   Patient taking medications as ordered. Will continue to monitor.

## 2012-07-14 NOTE — Progress Notes (Signed)
37 y/o female who presents voluntarily for depression, questionable Suicide attempt and substance abuse.  Patient states she was a patient at [facility] 3 weeks ago.  Patient states since she was discharged she was unable to sleep.  Patient states she took her scheduled dosege of trazodone 100mg  and then took a repeat dose which patient states she was prescribed.  Patient states she still could not sleep so she took 4 additional 100mg  Trazodone.  Patient states, "I started feeling crazy and I did not feel right so EMS was called."  Patient states this was not a suicide attempt.  Patient stated, "I just wanted to sleep.  I had not slept in days."  Patient states she also drank alcohol and used powder cocaine for the first time since her last admission on 06/09/2012.  Patient states her stressors are she fears going to jail because her son has not been going to school and she has been missing work and thinks she may lose her job.  Patient states she is supposed to see a therapist but has been missing appointments because of the lack of money for gas.  Patient states her medication was changed but then she could not afford it due to the price.  Patient states she has a history of depression, SI attempt in the past by OD,  HTN.  Patient skin assess tattoo and eyebrow ring.  Consents obtained, Fall safety plan explained and patient verbalized understanding.  Food and fluids offered and patient accepted both.  Patient escorted and oriented to the unit by [facility].  Patient had no additional questions or concerns.
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
## 2012-07-14 NOTE — Progress Notes (Signed)
Adult Psychoeducational Group Note  Date:  07/14/2012 Time:  1000  Group Topic/Focus:  Building Self Esteem:   The Focus of this group is helping patients become aware of the effects of self-esteem on their lives, the things they and others do that enhance or undermine their self-esteem, seeing the relationship between their level of self-esteem and the choices they make and learning ways to enhance self-esteem.  Participation Level:  Active  Participation Quality:  Appropriate, Attentive, Sharing and Supportive  Affect:  Appropriate  Cognitive:  Alert and Appropriate  Insight: Appropriate and Good  Engagement in Group:  Engaged and Supportive  Modes of Intervention:  Activity, Discussion, Education and Socialization  Additional Comments:  Patient insightful in group, plans to "get a better job and buy my own home to move my family out of where we are."   Noah Charon 07/14/2012, 12:28 PM

## 2012-07-14 NOTE — Plan of Care (Signed)
Problem: Ineffective individual coping Goal: STG: Patient will participate in after care plan Patient is attending group and discussing issues.   Horace Porteous Ryosuke Ericksen, LCSW 07/14/2012] Outcome: Completed/Met Date Met:  07/14/12 Patient attending groups and easily engaged in discussions.  She has outpatient services with [facility] and [facility].  F/u scheduled.  Horace Porteous Reylene Stauder, LCSW 07/14/2012

## 2012-07-14 NOTE — BHH Suicide Risk Assessment (Signed)
Suicide Risk Assessment  Admission Assessment     Nursing information obtained from:  Patient Demographic factors:  Low socioeconomic status Current Mental Status:  Self-harm behaviors Loss Factors:  Loss of significant relationship;Financial problems / change in socioeconomic status Historical Factors:  Prior suicide attempts Risk Reduction Factors:  Responsible for children under 35 years of age;Employed;Religious beliefs about death;Positive social support;Positive therapeutic relationship  CLINICAL FACTORS:   Depression:   Anhedonia Hopelessness Impulsivity Insomnia Severe  COGNITIVE FEATURES THAT CONTRIBUTE TO RISK:  Thought constriction (tunnel vision)    SUICIDE RISK:   Mild:  Suicidal ideation of limited frequency, intensity, duration, and specificity.  There are no identifiable plans, no associated intent, mild dysphoria and related symptoms, good self-control (both objective and subjective assessment), few other risk factors, and identifiable protective factors, including available and accessible social support.  PLAN OF CARE: Adjust medications as needed. Provide supportive counselling and education.  I certify that inpatient services furnished can reasonably be expected to improve the patient's condition.  Yonah Tangeman 07/14/2012, 11:29 AM
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
## 2012-07-14 NOTE — Tx Team (Signed)
Initial Interdisciplinary Treatment Plan  PATIENT STRENGTHS: (choose at least two) Ability for insight Active sense of humor Capable of independent living Communication skills Supportive family/friends  PATIENT STRESSORS: Financial difficulties Legal issue Marital or family conflict Substance abuse   PROBLEM LIST: Problem List/Patient Goals Date to be addressed Date deferred Reason deferred Estimated date of resolution  Depression "I have been so depressed" 07/13/2012     Substance Abuse "I snorted powder on Saturday" 07/13/2012     "I just wanted to sleep because my medication was not working" 07/13/2012                                          DISCHARGE CRITERIA:  Ability to meet basic life and health needs Improved stabilization in mood, thinking, and/or behavior Motivation to continue treatment in a less acute level of care Safe-care adequate arrangements made  PRELIMINARY DISCHARGE PLAN: Attend aftercare/continuing care group Outpatient therapy Return to previous living arrangement Return to previous work or school arrangements  PATIENT/FAMIILY INVOLVEMENT: This treatment plan has been presented to and reviewed with the patient, Linda Sheppard, and/or family member.  The patient and family have been given the opportunity to ask questions and make suggestions.  [name] 07/14/2012, 2:11 AM

## 2012-07-14 NOTE — Progress Notes (Signed)
D: Patient appeared somewhat brighter than she was yesterday at Psych ED. Her mood and affect appropriated. She endorsed feeling better. She reported that she continues to have problem sleeping well at night. She denied SI/HI and denied hallucinations. A: Writer encouraged patient, inquired if she had been taken naps during the day. R: Patient stated that she hasn't taken any nap today and that she had attended all her groups. And the NP promised to change her medications tonight. Q 15 minute checks continues as ordered to maintain safety

## 2012-07-14 NOTE — [facility] Suicide Risk Assessment (Signed)
  Suicide Prevention Education:  Education Completed;  Sharin Grave, Mother, [phone] has been identified by the patient as the family member/significant other with whom the patient will be residing, and identified as the person(s) who will aid the patient in the event of a mental health crisis (suicidal ideations/suicide attempt).  With written consent from the patient, the family member/significant other has been provided the following suicide prevention education, prior to the and/or following the discharge of the patient.  The suicide prevention education provided includes the following:  Suicide risk factors  Suicide prevention and interventions  National Suicide Hotline telephone number  [facility] assessment telephone number  [facility] Emergency Assistance 911  [facility] and/or Residential Mobile Crisis Unit telephone number  Request made of family/significant other to:  Remove weapons (e.g., guns, rifles, knives), all items previously/currently identified as safety concern.  Mother reports patient does not have access to guns.  Remove drugs/medications (over-the-counter, prescriptions, illicit drugs), all items previously/currently identified as a safety concern.  The family member/significant other verbalizes understanding of the suicide prevention education information provided.  The family member/significant other agrees to remove the items of safety concern listed above.  Firmin Belisle Hairston 07/14/2012, 10:14 AM

## 2012-07-14 NOTE — [facility] Group Notes (Signed)
[facility] LCSW Group Therapy     [facility] [place] 1:15 - 2:30 PM   07/14/2012 3:05 PM  Type of Therapy:  Group Therapy  Participation Level:  Minimal  Participation Quality:  Appropriate and Attentive  Affect:  Depressed  Cognitive:  Alert and Appropriate  Insight:  Developing/Improving and Engaged  Engagement in Therapy:  Developing/Improving and Engaged  Modes of Intervention:  Discussion, Education, Exploration, Problem-Solving, Rapport Building, Support  Summary of Progress/Problems:  Patient listened attentively to speaker from [employer].  She asked questions about services and show interest in participating in their programming.   Wynn Banker 07/14/2012, 3:05 PM

## 2012-07-14 NOTE — Progress Notes (Signed)
Adult Psychoeducational Group Note  Date:  07/14/2012 Time:  10:37 PM  Group Topic/Focus:  Karaoke  Participation Level:  Active  Participation Quality:  Appropriate  Affect:  Appropriate  Cognitive:  Alert  Insight: Appropriate  Engagement in Group:  Engaged  Modes of Intervention:  Activity  Additional Comments:  Pt stayed in group for  About 30 minutes then stated that she was starting to feel sick on her stomach, she returned to the unit.   Kaleen Odea R 07/14/2012, 10:37 PM

## 2012-07-14 NOTE — Plan of Care (Signed)
Problem: Alteration in mood & ability to function due to Goal: LTG-Patient demonstrates decreased signs of withdrawal (Patient demonstrates decreased signs of withdrawal to the point the patient is safe to return home and continue treatment in an outpatient setting)  Outcome: Completed/Met Date Met:  07/14/12 Patient is not endorsing any signs/symptoms of withdrawal.  .Marypat Kimmet, LCSW 07/14/2012

## 2012-07-15 DIAGNOSIS — F3160 Bipolar disorder, current episode mixed, unspecified: Secondary | ICD-10-CM

## 2012-07-15 NOTE — Progress Notes (Signed)
Psychoeducational Group Note  Date:  07/15/2012 Time:  1100   Group Topic/Focus:  therapeutic activity  Participation Level: Did Not Attend  Participation Quality:  Not Applicable  Affect:  Not Applicable  Cognitive:  Not Applicable  Insight:  Not Applicable  Engagement in Group: Not Applicable  Additional Comments:    Noah Charon 07/15/2012, 11:49 AM

## 2012-07-15 NOTE — Progress Notes (Signed)
  D) Patient pleasant and cooperative upon my assessment. Patient completed Patient Self Inventory, reports slept "fair," and  appetite is "improving." Patient rates depression as  9 /10, patient rates hopeless feelings as  9/10. Patient denies SI/HI, denies A/V hallucinations. Patient engaged in groups, conversing with peers, observed smiling and laughing in group setting. Patient appears more animated today, making more eye contact, initiating conversations with staff and peers.   A) Patient offered support and encouragement, patient encouraged to discuss feelings/concerns with staff. Patient verbalized understanding. Patient monitored Q15 minutes for safety. Patient met with MD  to discuss today's goals and plan of care.  R) Patient visible in milieu, attending groups in day room and meals in dining room. Patient appropriate with staff and peers.   Patient taking medications as ordered. Will continue to monitor.

## 2012-07-15 NOTE — Progress Notes (Signed)
Nutrition Brief Note  Patient identified on the Malnutrition Screening Tool (MST) Report  Body mass index is 48.02 kg/(m^2). Patient meets criteria for extreme obesity based on current BMI.   Current diet order is regular, patient is consuming approximately good% of meals at this time. Labs and medications reviewed.   Patient known from last admit. Patient reports 310 lbs on d/c here last month with 8 lb weight loss since due to inadequate intake secondary to decreased appetite  secondary to depression.  Discussed healthy eating with goal of slow weight loss.  No nutrition interventions warranted at this time. If nutrition issues arise, please consult RD.   Oran Rein, RD, LDN Clinical Inpatient Dietitian Pager:  [phone] Weekend and after hours pager:  [phone]

## 2012-07-15 NOTE — Progress Notes (Signed)
Adult Psychoeducational Group Note  Date:  07/15/2012 Time:  12:37 PM  Group Topic/Focus:  Stages of Change:   The focus of this group is to explain the stages of change and help patients identify changes they want to make upon discharge.  Participation Level:  Active  Participation Quality:  Appropriate, Attentive, Sharing and Supportive  Affect:  Appropriate  Cognitive:  Alert and Appropriate  Insight: Appropriate and Good  Engagement in Group:  Engaged  Modes of Intervention:  Discussion and Education  Additional Comments:  Pt was asked to fill out "Personal Plan for change" making her goal to stay focused, alert and keep her thoughts positive. Pt states " I know I need to take care of myself and kids. And the things I need to watch out for are TV because it makes me depressed."  Dalia Heading 07/15/2012, 12:37 PM

## 2012-07-15 NOTE — [facility] Group Notes (Signed)
[facility] LCSW Group Therapy        Feelings Around Relapse        1:15-2:30 PM    07/15/2012 12:54 PM  Type of Therapy:  Group Therapy  Participation Level:  Did Not Attend  Wynn Banker 07/15/2012, 12:54 PM

## 2012-07-15 NOTE — Progress Notes (Signed)
D: Patient in bed resting on first approach.  Patient states she had a good day.  Patient states she is feeling better.  Patient states she was taking Abilify but was taken off of it.  Patient states she felt her best when on the abilify.  Patient states she is worried that he will have to go to jsil because her 35 year old son has not gone to school all week.  Patient states she is here for Depression not suicidal thoughts but does not think she should be discharged tomorrow.  PAtient states that while here she has been catching up on her rest and she has learned that it is important for her to take her medications as prescribed.  Patient denies SI/HI and denies AVH. A: Staff to monitor Q 15 mins for safety.  Encouragement and support offered.  Scheduled medications administered per orders.   R: Patient remains safe on the unit.  Patient did attend group tonight.  Patient taking administered mediations.  Patient animated, cooeprative and calm.

## 2012-07-15 NOTE — Progress Notes (Signed)
[facility] Adult Case Management Discharge Plan :  Will you be returning to the same living situation after discharge: Yes,  Patient is returning to her home. At discharge, do you have transportation home?:Yes,  Patient has transportation to get home. Do you have the ability to pay for your medications:No.  Patient assisted with indigent medications.  Release of information consent forms completed and in the chart;  Patient's signature needed at discharge.  Patient to Follow up at: Follow-up Information   [facility] with [facility] On 07/22/2012. (You are scheduled with LaTasha on Friday, Jul 22, 2012 at 11AM)    Contact information:   [address], [place]  [address]  [phone]      Follow up with [facility].   Contact information:   [address] [place], [place]   [address]  [phone]      Patient denies SI/HI: Patient no longer endorses SI/other thought self harm  Safety Planning and Suicide Prevention discussed:  .Reviewed with all patients during discharge planning group  Ilhan Debenedetto, Joesph July 07/15/2012, 12:50 PM

## 2012-07-15 NOTE — Progress Notes (Signed)
[facility] MD Progress Note  07/15/2012 11:17 AM Linda Sheppard  MRN:  [id number] Subjective:  Patient reports she is tolerating her medications well. Denies side effects. Concerned about her daughter. Continues to be depressed and having suicidal thoughts. Able to sleep better on the seroquel, agitation improving. Diagnosis:   Axis I: Bipolar, mixed Axis II: Deferred Axis III:  Past Medical History  Diagnosis Date  . Obesity   . Hypertension   . Polysubstance abuse   . Bipolar 1 disorder   . Schizophrenia   . Depression   . Anxiety    Axis IV: other psychosocial or environmental problems Axis V: 41-50 serious symptoms  ADL's:  Intact  Sleep: Fair  Appetite:  Fair   Psychiatric Specialty Exam: Review of Systems  Constitutional: Negative.   HENT: Negative.   Respiratory: Negative.   Gastrointestinal: Negative.   Genitourinary: Negative.   Musculoskeletal: Negative.   Skin: Negative.   Neurological: Negative.   Endo/Heme/Allergies: Negative.   Psychiatric/Behavioral: Positive for depression and suicidal ideas. The patient is nervous/anxious.     Blood pressure 149/103, pulse 102, temperature 98.5 F (36.9 C), temperature source Oral, resp. rate 20, height 5' 6.5" (1.689 m), weight 136.986 kg (302 lb), last menstrual period 06/27/2012.Body mass index is 48.02 kg/(m^2).  General Appearance: Disheveled  Eye Solicitor::  Fair  Speech:  Pressured  Volume:  Increased  Mood:  Anxious, Depressed and Dysphoric  Affect:  Labile  Thought Process:  Circumstantial  Orientation:  Full (Time, Place, and Person)  Thought Content:  Rumination  Suicidal Thoughts:  No  Homicidal Thoughts:  No  Memory:  Immediate;   Fair Recent;   Fair Remote;   Fair  Judgement:  Fair  Insight:  Present  Psychomotor Activity:  Normal  Concentration:  Fair  Recall:  Fair  Akathisia:  No  Handed:  Right  AIMS (if indicated):     Assets:  Communication Skills Desire for Improvement Housing Social  Support  Sleep:  Number of Hours: 6.75   Current Medications: Current Facility-Administered Medications  Medication Dose Route Frequency Provider Last Rate Last Dose  . acetaminophen (TYLENOL) tablet 650 mg  650 mg Oral Q6H PRN Kerry Hough, PA-C      . alum & mag hydroxide-simeth (MAALOX/MYLANTA) 200-200-20 MG/5ML suspension 30 mL  30 mL Oral Q4H PRN Kerry Hough, PA-C   30 mL at 07/15/12 0650  . DULoxetine (CYMBALTA) DR capsule 30 mg  30 mg Oral BID Kerry Hough, PA-C   30 mg at 07/15/12 7829  . gabapentin (NEURONTIN) capsule 600 mg  600 mg Oral QHS Kerry Hough, PA-C   600 mg at 07/14/12 2147  . hydrochlorothiazide (HYDRODIURIL) tablet 25 mg  25 mg Oral Daily Kerry Hough, PA-C   25 mg at 07/15/12 5621  . hydrOXYzine (ATARAX/VISTARIL) tablet 50 mg  50 mg Oral QHS,MR X 1 Kerry Hough, PA-C   50 mg at 07/14/12 2147  . magnesium hydroxide (MILK OF MAGNESIA) suspension 30 mL  30 mL Oral Daily PRN Kerry Hough, PA-C      . nicotine (NICODERM CQ - dosed in mg/24 hours) patch 14 mg  14 mg Transdermal Q0600 Kerry Hough, PA-C      . QUEtiapine (SEROQUEL) tablet 50 mg  50 mg Oral QHS Sherby Moncayo, MD   50 mg at 07/14/12 2147    Lab Results: No results found for this or any previous visit (from the past 48 hour(s)).  Physical  Findings: AIMS: Facial and Oral Movements Muscles of Facial Expression: None, normal Lips and Perioral Area: None, normal Jaw: None, normal Tongue: None, normal,Extremity Movements Upper (arms, wrists, hands, fingers): None, normal Lower (legs, knees, ankles, toes): None, normal, Trunk Movements Neck, shoulders, hips: None, normal, Overall Severity Severity of abnormal movements (highest score from questions above): None, normal Incapacitation due to abnormal movements: None, normal Patient's awareness of abnormal movements (rate only patient's report): No Awareness, Dental Status Current problems with teeth and/or dentures?: No Does patient  usually wear dentures?: No  CIWA:  CIWA-Ar Total: 0 COWS:     Treatment Plan Summary: Daily contact with patient to assess and evaluate symptoms and progress in treatment Medication management  Plan: Continue current plan of acre. Continue to monitor.  Medical Decision Making Problem Points:  Established problem, stable/improving (1), Review of last therapy session (1) and Review of psycho-social stressors (1) Data Points:  Review of medications.   I certify that inpatient services furnished can reasonably be expected to improve the patient's condition.   Octavia Velador 07/15/2012, 11:17 AM

## 2012-07-15 NOTE — [facility] Group Notes (Signed)
[facility] LCSW Aftercare Discharge Planning Group Note   07/15/2012 12:52 PM  Participation Quality:  Appropriae  Mood/Affect:  Appropriate  Depression Rating:    Anxiety Rating:    Thoughts of Suicide:  No Will you contract for safety?   NA  Current AVH:  No  Plan for Discharge/Comments:  Patient reports being bette today.  She advised of being hopeful to discharge home tomorrow. She will follow up with [facility] for outpatient services.  Transportation Means:  Patient has transportation.   Supports:Patient has a good support system.    Alya Smaltz, Joesph July

## 2012-07-15 NOTE — Progress Notes (Signed)
Adult Psychoeducational Group Note  Date:  07/15/2012 Time:  9:36 PM  Group Topic/Focus:  Wrap-Up Group:   The focus of this group is to help patients review their daily goal of treatment and discuss progress on daily workbooks.  Participation Level:  Active  Participation Quality:  Appropriate  Affect:  Appropriate  Cognitive:  Alert  Insight: Appropriate  Engagement in Group:  Engaged  Modes of Intervention:  Discussion  Additional Comments:  Played a game, pt had to pick one positive thing that she enjoys to do and peers had to pick it. Pt stated that she enjoys to make big meals for her family because she is the only one that cooks.  Flonnie Hailstone 07/15/2012, 9:36 PM

## 2012-07-16 DIAGNOSIS — G8929 Other chronic pain: Secondary | ICD-10-CM | POA: Diagnosis present

## 2012-07-16 MED ORDER — QUETIAPINE FUMARATE 50 MG PO TABS: 50.0000 mg | ORAL_TABLET | Freq: Every day | ORAL | Status: DC

## 2012-07-16 MED ORDER — AMITRIPTYLINE HCL 25 MG PO TABS
25.0000 mg | ORAL_TABLET | Freq: Every day | ORAL | Status: DC
  Administered 2012-07-16 – 2012-07-17 (×2): 25 mg via ORAL
  Filled 2012-07-16 (×2): qty 1
  Filled 2012-07-16: qty 14
  Filled 2012-07-16: qty 1

## 2012-07-16 MED ORDER — GABAPENTIN 100 MG PO CAPS
200.0000 mg | ORAL_CAPSULE | Freq: Three times a day (TID) | ORAL | Status: DC
  Administered 2012-07-16 – 2012-07-18 (×7): 200 mg via ORAL
  Filled 2012-07-16 (×4): qty 2
  Filled 2012-07-16: qty 84
  Filled 2012-07-16 (×3): qty 2
  Filled 2012-07-16 (×2): qty 84
  Filled 2012-07-16: qty 2
  Filled 2012-07-16: qty 84
  Filled 2012-07-16 (×2): qty 2

## 2012-07-16 MED ORDER — DULOXETINE HCL 30 MG PO CPEP: 30.0000 mg | ORAL_CAPSULE | Freq: Two times a day (BID) | ORAL | Status: DC

## 2012-07-16 MED ORDER — ARIPIPRAZOLE 15 MG PO TABS
15.0000 mg | ORAL_TABLET | Freq: Every day | ORAL | Status: DC
  Administered 2012-07-16 – 2012-07-17 (×2): 15 mg via ORAL
  Filled 2012-07-16 (×2): qty 1
  Filled 2012-07-16: qty 14
  Filled 2012-07-16: qty 1

## 2012-07-16 MED ORDER — HYDROCHLOROTHIAZIDE 25 MG PO TABS: 25.0000 mg | ORAL_TABLET | Freq: Every day | ORAL | Status: DC

## 2012-07-16 MED ORDER — GRX ANALGESIC BALM EX OINT
TOPICAL_OINTMENT | Freq: Four times a day (QID) | CUTANEOUS | Status: DC
  Administered 2012-07-16: 12:00:00 via CUTANEOUS
  Filled 2012-07-16: qty 28

## 2012-07-16 MED ORDER — HYDROXYZINE HCL 50 MG PO TABS: 50.0000 mg | ORAL_TABLET | Freq: Every evening | ORAL | Status: DC | PRN

## 2012-07-16 MED ORDER — GABAPENTIN 300 MG PO CAPS: 600.0000 mg | ORAL_CAPSULE | Freq: Every day | ORAL | Status: DC

## 2012-07-16 NOTE — [facility] Group Notes (Signed)
[facility] Group Notes:  (Clinical Social Work)  07/16/2012   3:00-4:00PM  Summary of Progress/Problems:   The main focus of today's process group was for the patient to identify something in their life that led to their hospitalization that they would like to change, then to discuss their motivation to change. The patients as a group were quite depressed and focused on missing Mother's Day with family.  The patient expressed she needs/wants to change her attitude and lifestyle.  She stated that she is a happy person all the time.  She then talked about how she used to be so quiet, but has come out of her shell recently, and can go "from 0 to 60" immediately and fight someone who says something she does not like.  She also said she had her kids when she was 38-101 years old, so she has been engaging in a party lifestyle and needs to stop.    Type of Therapy:  Process Group  Participation Level:  Minimal  Participation Quality:  Attentive and Sharing  Affect:  Not Congruent  Cognitive:  Oriented  Insight:  Improving  Engagement in Therapy:  Improving  Modes of Intervention:  Clarification, Support and Processing, Exploration, Discussion   Ambrose Mantle, LCSW 07/16/2012, 5:36 PM

## 2012-07-16 NOTE — Progress Notes (Signed)
D. Pt has been up and has been active in milieu and has been attending and participating in various milieu activities. Pt brightens on approach but does endorse feeling depressed and hopeless and says she is tired of pretending to be happy when she's really not. Pt spoke about work and wanting to move, pt did receive medications without incident and did not want to use muscle rub as she said it smelled but said that the pills seemed to be helping her. A. Support and encouragement provided, medication education done. R. Pt verbalized understanding, will continue to monitor.

## 2012-07-16 NOTE — Progress Notes (Signed)
Adult Psychoeducational Group Note  Date:  07/16/2012 Time:  1015  Group Topic/Focus:  Healthy Communication:   The focus of this group is to discuss communication, barriers to communication, as well as healthy ways to communicate with others.  Participation Level:  Active  Participation Quality:  Attentive  Affect:  Appropriate  Cognitive:  Appropriate  Insight: Appropriate  Engagement in Group:  Engaged  Modes of Intervention:  Discussion  Additional Comments:  Discussed her feelings and how to use them w/family/friends  Linda Sheppard 07/16/2012, 11:08 AM

## 2012-07-16 NOTE — Progress Notes (Signed)
[facility] Group Notes:  (Nursing/MHT/Case Management/Adjunct)  Date:  07/16/2012  Time:  11:22 PM  Type of Therapy:  Group Therapy  Participation Level:  Active  Participation Quality:  Appropriate, Intrusive, Redirectable and Supportive  Affect:  Appropriate  Cognitive:  Appropriate  Insight:  Appropriate  Engagement in Group:  Engaged  Modes of Intervention:  Socialization and Support  Summary of Progress/Problems: Pt. Stated she is started to feel better.  Pt. Stated would enjoyed interacting and talking with people as a coping skill.  Sondra Come 07/16/2012, 11:22 PM

## 2012-07-16 NOTE — Progress Notes (Signed)
Adult Psychoeducational Group Note  Date:  07/16/2012 Time:  0900  Group Topic/Focus:  Self inventory sheet Participation Level:  Active  Participation Quality:  Appropriate  Affect:  Appropriate  Cognitive:  Appropriate  Insight: Appropriate  Engagement in Group:  Engaged  Modes of Intervention:  Discussion  Additional Comments:  Only participatant in group  Roselee Culver 07/16/2012, 11:07 AM

## 2012-07-16 NOTE — Progress Notes (Signed)
[facility] MD Progress Note  07/16/2012 11:22 AM Linda Sheppard  MRN:  [id number] Subjective:  Patient presents with some confusion and continued back pain.  Will stop the Seroquel and go back to Abilify for bipolar management, but will use Neurontin during the day and add Elavil at bedtime for her pain, anxiety, depression, and insomnia problems. Diagnosis:   Axis I: Bipolar, mixed Axis II: Deferred Axis III:  Past Medical History  Diagnosis Date  . Obesity   . Hypertension   . Polysubstance abuse   . Bipolar 1 disorder   . Schizophrenia   . Depression   . Anxiety    Axis IV: other psychosocial or environmental problems Axis V: 41-50 serious symptoms  ADL's:  Intact  Sleep: Fair  Appetite:  Fair   Psychiatric Specialty Exam: Review of Systems  Constitutional: Negative.   HENT: Negative.   Respiratory: Negative.   Gastrointestinal: Negative.   Genitourinary: Negative.   Musculoskeletal: Negative.   Skin: Negative.   Neurological: Negative.   Endo/Heme/Allergies: Negative.   Psychiatric/Behavioral: Positive for depression and suicidal ideas. The patient is nervous/anxious.     Blood pressure 133/94, pulse 103, temperature 97.3 F (36.3 C), temperature source Oral, resp. rate 18, height 5' 6.5" (1.689 m), weight 136.986 kg (302 lb), last menstrual period 06/27/2012.Body mass index is 48.02 kg/(m^2).  General Appearance: Disheveled  Eye Solicitor::  Fair  Speech:  Pressured  Volume:  Increased  Mood:  Anxious, Depressed and Dysphoric  Affect:  Labile  Thought Process:  Circumstantial  Orientation:  Full (Time, Place, and Person)  Thought Content:  Rumination  Suicidal Thoughts:  No  Homicidal Thoughts:  No  Memory:  Immediate;   Fair Recent;   Fair Remote;   Fair  Judgement:  Fair  Insight:  Present  Psychomotor Activity:  Normal  Concentration:  Fair  Recall:  Fair  Akathisia:  No  Handed:  Right  AIMS (if indicated):     Assets:  Communication Skills Desire  for Improvement Housing Social Support  Sleep:  Number of Hours: 6.75   Current Medications: Current Facility-Administered Medications  Medication Dose Route Frequency Provider Last Rate Last Dose  . acetaminophen (TYLENOL) tablet 650 mg  650 mg Oral Q6H PRN Kerry Hough, PA-C      . alum & mag hydroxide-simeth (MAALOX/MYLANTA) 200-200-20 MG/5ML suspension 30 mL  30 mL Oral Q4H PRN Kerry Hough, PA-C   30 mL at 07/15/12 1948  . amitriptyline (ELAVIL) tablet 25 mg  25 mg Oral QHS Mike Craze, MD      . DULoxetine (CYMBALTA) DR capsule 30 mg  30 mg Oral BID Kerry Hough, PA-C   30 mg at 07/16/12 0741  . gabapentin (NEURONTIN) capsule 200 mg  200 mg Oral TID Mike Craze, MD      . gabapentin (NEURONTIN) capsule 600 mg  600 mg Oral QHS Kerry Hough, PA-C   600 mg at 07/15/12 2106  . GRX ANALGESIC BALM OINT   Apply externally QID Mike Craze, MD      . hydrochlorothiazide (HYDRODIURIL) tablet 25 mg  25 mg Oral Daily Kerry Hough, PA-C   25 mg at 07/16/12 0741  . hydrOXYzine (ATARAX/VISTARIL) tablet 50 mg  50 mg Oral QHS,MR X 1 Kerry Hough, PA-C   50 mg at 07/15/12 2106  . magnesium hydroxide (MILK OF MAGNESIA) suspension 30 mL  30 mL Oral Daily PRN Kerry Hough, PA-C      .  nicotine (NICODERM CQ - dosed in mg/24 hours) patch 14 mg  14 mg Transdermal Q0600 Kerry Hough, PA-C      . QUEtiapine (SEROQUEL) tablet 50 mg  50 mg Oral QHS Himabindu Ravi, MD   50 mg at 07/15/12 2107    Lab Results: No results found for this or any previous visit (from the past 48 hour(s)).  Physical Findings: AIMS: Facial and Oral Movements Muscles of Facial Expression: None, normal Lips and Perioral Area: None, normal Jaw: None, normal Tongue: None, normal,Extremity Movements Upper (arms, wrists, hands, fingers): None, normal Lower (legs, knees, ankles, toes): None, normal, Trunk Movements Neck, shoulders, hips: None, normal, Overall Severity Severity of abnormal movements  (highest score from questions above): None, normal Incapacitation due to abnormal movements: None, normal Patient's awareness of abnormal movements (rate only patient's report): No Awareness, Dental Status Current problems with teeth and/or dentures?: No Does patient usually wear dentures?: No  CIWA:  CIWA-Ar Total: 0 COWS:     Treatment Plan Summary: Daily contact with patient to assess and evaluate symptoms and progress in treatment Medication management  Plan: Continue current plan of acre. Continue to monitor.  Medical Decision Making Problem Points:  Established problem, stable/improving (1), New problem, with no additional work-up planned (3), Review of last therapy session (1) and Review of psycho-social stressors (1) Data Points:  Review of medications.  I certify that inpatient services furnished can reasonably be expected to improve the patient's condition.   Orson Aloe, MD, [facility]  07/16/2012, 11:22 AM

## 2012-07-16 NOTE — Progress Notes (Signed)
D patient states she slept fair and appetite is improving, energy level is normal and ability to pay attention is improving, states depressed 7/10 and hopeless 7/10 today, denies Si or Hi and no AVH today, taking meds as ordered by MD and attending group and participating, some meds have been changed today after seeing MD in conference, pleasant and cooperative w/peers and staff. Appropriate affect smiling and talking to all. A q43min safety checks continue and support offered and she is participating in groups R patient remains safe on the unit

## 2012-07-17 DIAGNOSIS — F411 Generalized anxiety disorder: Secondary | ICD-10-CM

## 2012-07-17 DIAGNOSIS — F3130 Bipolar disorder, current episode depressed, mild or moderate severity, unspecified: Secondary | ICD-10-CM

## 2012-07-17 MED ORDER — LIDOCAINE 5 % EX PTCH
1.0000 | MEDICATED_PATCH | Freq: Every day | CUTANEOUS | Status: DC
  Administered 2012-07-17 – 2012-07-18 (×2): 1 via TRANSDERMAL
  Filled 2012-07-17 (×3): qty 1
  Filled 2012-07-17: qty 7

## 2012-07-17 NOTE — Progress Notes (Signed)
[facility] Group Notes:  (Nursing/MHT/Case Management/Adjunct)  Date:  07/17/2012  Time:  10:00AM  Type of Therapy:  Psychoeducational Skills and stress inventory  Participation Level:  Minimal  Participation Quality:  Appropriate, Attentive and Supportive  Affect:  Appropriate  Cognitive:  Alert and Appropriate  Insight:  Appropriate and Improving  Engagement in Group:  Developing/Improving and Supportive  Modes of Intervention:  Clarification, Discussion, Education, Exploration, Problem-solving and Support  Summary of Progress/Problems:  Linda Sheppard 07/17/2012, 11:29 AM

## 2012-07-17 NOTE — Progress Notes (Signed)
Patient ID: Linda Sheppard, female   DOB: [birth year], 37 y.o.   MRN: [id number] D: "I want something else, that ointmant doesn't work.". A: Pt. denies lethality and A/V/H's and is insisting that she "is ready to go home: the doctor told me I could go home yesterday."  Pt. referred to providers and Pt. states "Dr. Dan Humphreys wouldn't see me." Pt. was also referred to Celso Amy, NP, who also states Dr. Dan Humphreys said no one was to be discharged today.  Pt.'s agitation was accelerated for a brief time, then she became calmer and had an "oh, well" attitude.  Pt. stated her fiance was to leave town today, but she apparently told him she couldn't leave, so he is staying over "for one more day."  Pt. continues to be restless, animated and wears excessive makeup, including thick false eyelashes, which Pt. says she wears because "I only have 6 (natural) eyelashes."   R: Will continue to monitor for changes.

## 2012-07-17 NOTE — Progress Notes (Signed)
D: Pt in bed resting with eyes closed. Respirations even and unlabored. Pt appears to be in no signs of distress at this time. A: Q15min checks remains for this pt. R: Pt remains safe at this time.   

## 2012-07-17 NOTE — Progress Notes (Signed)
Patient ID: Linda Sheppard, female   DOB: December 09, 1975, 36 y.o.   MRN: [id number] D: Pt. Visible on the unit on the hall and in dayroom interacting with other clients. Pt. Reports no visitors today and she was upset today because "they got my vehicles". Staff encouraged group. Staff will monitor q4min for safety. R: Pt. Is safe on the unit. Pt. Attended group.

## 2012-07-17 NOTE — Progress Notes (Signed)
[facility] MD Progress Note  07/17/2012 9:53 AM Linda Sheppard  MRN:  [id number] Subjective:  Slept well and feels "great today", she was not "groggy and could get right up," she feels the Abilify is what made the difference for her.  Appetite good, depression and anxiety are minimal, slightly upset earlier over her daughter not cleaning her room but "fine now."  She has a bright affect and engages in conversation easily.  Diagnosis:   Axis I: Anxiety Disorder NOS and Bipolar, Depressed Axis II: Deferred Axis III:  Past Medical History  Diagnosis Date  . Obesity   . Hypertension   . Polysubstance abuse   . Bipolar 1 disorder   . Schizophrenia   . Depression   . Anxiety    Axis IV: other psychosocial or environmental problems, problems related to social environment and problems with primary support group Axis V: 41-50 serious symptoms  ADL's:  Intact  Sleep: Good  Appetite:  Good  Suicidal Ideation:  Denies Homicidal Ideation:  Denies  Psychiatric Specialty Exam: Review of Systems  Constitutional: Negative.   HENT: Negative.   Eyes: Negative.   Respiratory: Negative.   Cardiovascular: Negative.   Gastrointestinal: Negative.   Genitourinary: Negative.   Musculoskeletal: Positive for back pain.  Skin: Negative.   Endo/Heme/Allergies: Negative.   Psychiatric/Behavioral: Positive for depression. The patient is nervous/anxious.     Blood pressure 159/118, pulse 104, temperature 97.4 F (36.3 C), temperature source Oral, resp. rate 20, height 5' 6.5" (1.689 m), weight 136.986 kg (302 lb), last menstrual period 06/27/2012.Body mass index is 48.02 kg/(m^2).  General Appearance: Casual  Eye Contact::  Fair  Speech:  Normal Rate  Volume:  Normal  Mood:  Euphoric  Affect:  Congruent  Thought Process:  Coherent  Orientation:  Full (Time, Place, and Person)  Thought Content:  WDL  Suicidal Thoughts:  No  Homicidal Thoughts:  No  Memory:  Immediate;   Fair Recent;    Fair Remote;   Fair  Judgement:  Intact  Insight:  Fair  Psychomotor Activity:  Normal  Concentration:  Fair  Recall:  Fair  Akathisia:  No  Handed:  Right  AIMS (if indicated):     Assets:  Communication Skills Resilience Social Support  Sleep:  Number of Hours: 6.75   Current Medications: Current Facility-Administered Medications  Medication Dose Route Frequency Provider Last Rate Last Dose  . acetaminophen (TYLENOL) tablet 650 mg  650 mg Oral Q6H PRN Kerry Hough, PA-C      . alum & mag hydroxide-simeth (MAALOX/MYLANTA) 200-200-20 MG/5ML suspension 30 mL  30 mL Oral Q4H PRN Kerry Hough, PA-C   30 mL at 07/16/12 1426  . amitriptyline (ELAVIL) tablet 25 mg  25 mg Oral QHS Mike Craze, MD   25 mg at 07/16/12 2110  . ARIPiprazole (ABILIFY) tablet 15 mg  15 mg Oral QHS Mike Craze, MD   15 mg at 07/16/12 2110  . DULoxetine (CYMBALTA) DR capsule 30 mg  30 mg Oral BID Kerry Hough, PA-C   30 mg at 07/17/12 0809  . gabapentin (NEURONTIN) capsule 200 mg  200 mg Oral TID Mike Craze, MD   200 mg at 07/17/12 0809  . gabapentin (NEURONTIN) capsule 600 mg  600 mg Oral QHS Kerry Hough, PA-C   600 mg at 07/16/12 2110  . hydrochlorothiazide (HYDRODIURIL) tablet 25 mg  25 mg Oral Daily Kerry Hough, PA-C   25 mg at 07/17/12 0809  .  hydrOXYzine (ATARAX/VISTARIL) tablet 50 mg  50 mg Oral QHS,MR X 1 Kerry Hough, PA-C   50 mg at 07/16/12 2110  . lidocaine (LIDODERM) 5 % 1 patch  1 patch Transdermal Q24H Nanine Means, NP      . magnesium hydroxide (MILK OF MAGNESIA) suspension 30 mL  30 mL Oral Daily PRN Kerry Hough, PA-C      . nicotine (NICODERM CQ - dosed in mg/24 hours) patch 14 mg  14 mg Transdermal Q0600 Kerry Hough, PA-C        Lab Results: No results found for this or any previous visit (from the past 48 hour(s)).  Physical Findings: AIMS: Facial and Oral Movements Muscles of Facial Expression: None, normal Lips and Perioral Area: None, normal Jaw:  None, normal Tongue: None, normal,Extremity Movements Upper (arms, wrists, hands, fingers): None, normal Lower (legs, knees, ankles, toes): None, normal, Trunk Movements Neck, shoulders, hips: None, normal, Overall Severity Severity of abnormal movements (highest score from questions above): None, normal Incapacitation due to abnormal movements: None, normal Patient's awareness of abnormal movements (rate only patient's report): No Awareness, Dental Status Current problems with teeth and/or dentures?: No Does patient usually wear dentures?: No  CIWA:  CIWA-Ar Total: 0 COWS:     Treatment Plan Summary: Daily contact with patient to assess and evaluate symptoms and progress in treatment Medication management  Plan:  Review of chart, vital signs, medications, and notes. 1-Individual and group therapy 2-Medication management for depression and anxiety:  Medications reviewed with the patient and she stated no untoward effects, no changes made 3-Coping skills for depression, anxiety, and mood disorder 4-Continue crisis stabilization and management 5-Address health issues--monitoring vital signs, stable--lidoderm patch order placed for her lower back pain, analgesic cream discontinued due to ineffectiveness 6-Treatment plan in progress to prevent relapse of depression, mood instability, and anxiety  Medical Decision Making Problem Points:  Established problem, stable/improving (1) and Review of psycho-social stressors (1) Data Points:  Review of medication regiment & side effects (2)  I certify that inpatient services furnished can reasonably be expected to improve the patient's condition.   Nanine Means, PMH-NP 07/17/2012, 9:53 AM

## 2012-07-18 MED ORDER — AMITRIPTYLINE HCL 25 MG PO TABS: 25.0000 mg | ORAL_TABLET | Freq: Every day | ORAL | Status: DC

## 2012-07-18 MED ORDER — LIDOCAINE 5 % EX PTCH: 1.0000 | MEDICATED_PATCH | Freq: Every day | CUTANEOUS | Status: DC

## 2012-07-18 MED ORDER — ARIPIPRAZOLE 15 MG PO TABS: 15.0000 mg | ORAL_TABLET | Freq: Every day | ORAL | Status: DC

## 2012-07-18 MED ORDER — GABAPENTIN 100 MG PO CAPS: 200.0000 mg | ORAL_CAPSULE | Freq: Three times a day (TID) | ORAL | Status: DC

## 2012-07-18 NOTE — Progress Notes (Signed)
Adult Psychoeducational Group Note  Date:  07/18/2012 Time:  7:15 PM  Group Topic/Focus:  Self Care:   The focus of this group is to help patients understand the importance of self-care in order to improve or restore emotional, physical, spiritual, interpersonal, and financial health.  Participation Level:  Minimal  Participation Quality:  Appropriate  Affect:  Appropriate  Cognitive:  Appropriate  Insight: Limited  Engagement in Group:  Limited  Modes of Intervention:  Discussion, Education and Support  Additional Comments:  Pt actively participated in group by completing the "Self-Care Inventory" worksheet. Pt offered no contributions to group discussion.   [name] 07/18/2012, 7:15 PM

## 2012-07-18 NOTE — Progress Notes (Addendum)
[facility] Adult Case Management Discharge Plan :  Will you be returning to the same living situation after discharge: Yes,  Patient is returning to her home. At discharge, do you have transportation home?:Yes,  Patient has transportation to get home. Do you have the ability to pay for your medications:No.  Patient assisted with indigent medications.  Release of information consent forms completed and in the chart;  Patient's signature needed at discharge.  Patient to Follow up at: Follow-up Information   [facility] On 07/22/2012. (You are scheduled with LaTasha on Friday, Jul 22, 2012 at 11AM)    Contact information:   [address] [place], [place]  [address]  [phone]      Follow up with [facility].   Contact information:   [address] [place], [place]   [address]  [phone]      Patient denies SI/HI: Patient no longer endorses SI/other thought self harm  Safety Planning and Suicide Prevention discussed:  .Reviewed with all patients during discharge planning group  Anndrea Mihelich, [place]es[place] July 07/18/2012, 12:15 PM

## 2012-07-18 NOTE — Plan of Care (Signed)
Problem: Alteration in mood Goal: LTG-Pt's behavior demonstrates decreased signs of depression Patient currently rating symptoms at nine. She is attending groups and working on problems.  Horace Porteous Muhammadali Ries, LCSW 07/14/2012]  Outcome: Completed/Met Date Met:  07/18/12 Patient reports being much improved today and rates depression at four/five.  She reports medications are working and she is ready to discharge home.  Horace Porteous Tongela Encinas, LCSW 07/18/2012

## 2012-07-18 NOTE — Progress Notes (Signed)
[facility] Adult Case Management Discharge Plan :  Will you be returning to the same living situation after discharge: Yes,  Patient to return to her home. At discharge, do you have transportation home?:Yes,  Patient to arrange transportation home. Do you have the ability to pay for your medications:No.  Patient will need assistance with indigent medications.  Release of information consent forms completed and in the chart;  Patient's signature needed at discharge.  Patient to Follow up at: Follow-up Information   [facility] up with [facility] On 07/22/2012. (You are scheduled with LaTasha on Friday, Jul 22, 2012 at 11AM)    Contact information:   [address] [place], [place]  [address]  [phone]      Follow up with [facility].   Contact information:   [address] [place], [place]   [address]  [phone]      Patient denies SI/HI:  Patient no longer endorsing SI/HI or other thoughts of self harm.  Safety Planning and Suicide Prevention discussed: .Reviewed with all patients during discharge planning group   Vishal Sandlin, [place] July 07/18/2012, 10:26 AM

## 2012-07-18 NOTE — [facility] Suicide Risk Assessment (Signed)
Suicide Risk Assessment  Discharge Assessment     Demographic Factors:  Female, African american  Mental Status Per Nursing Assessment::   On Admission:  Self-harm behaviors  Current Mental Status by Physician: Patient alert and oriented to 4. Denies AH/VH/SI/HI.  Loss Factors: Financial problems/change in socioeconomic status  Historical Factors: Impulsivity  Risk Reduction Factors:   Sense of responsibility to family, Employed and Positive social support  Continued Clinical Symptoms:  Bipolar Disorder:   Mixed State  Cognitive Features That Contribute To Risk:  Cognitively intact  Suicide Risk:  Minimal: No identifiable suicidal ideation.  Patients presenting with no risk factors but with morbid ruminations; may be classified as minimal risk based on the severity of the depressive symptoms  Discharge Diagnoses:   AXIS I:  Bipolar, mixed AXIS II:  Deferred AXIS III:   Past Medical History  Diagnosis Date  . Obesity   . Hypertension   . Polysubstance abuse   . Bipolar 1 disorder   . Schizophrenia   . Depression   . Anxiety    AXIS IV:  economic problems and other psychosocial or environmental problems AXIS V:  61-70 mild symptoms  Plan Of Care/Follow-up recommendations:  Activity:  as tolerated Diet:  regular Follow up with outpatient appointments.  Is patient on multiple antipsychotic therapies at discharge:  No   Has Patient had three or more failed trials of antipsychotic monotherapy by history:  No  Recommended Plan for Multiple Antipsychotic Therapies: NA  Ryott Rafferty 07/18/2012, 10:45 AM

## 2012-07-18 NOTE — Tx Team (Signed)
Interdisciplinary Treatment Plan Update   Date Reviewed:  07/18/2012  Time Reviewed:  10:23 AM  Progress in Treatment:   Attending groups: Yes Participating in groups: Yes Taking medication as prescribed: Yes  Tolerating medication: Yes Family/Significant other contact made: Yes, contact made with mother Patient understands diagnosis: Yes  Discussing patient identified problems/goals with staff: Yes Medical problems stabilized or resolved: Yes Denies suicidal/homicidal ideation: Yes Patient has not harmed self or others: Yes  For review of initial/current patient goals, please see plan of care.  Estimated Length of Stay:  Discharge home today  Reasons for Continued Hospitalization:   New Problems/Goals identified:    Discharge Plan or Barriers:   Home with outpatient follow up at [facility] and [facility]  Additional Comments:  Patient admitted following ODing.  She is not endorsing SI/HI today and rates depression at four/five and anxiety at six.  Attendees:  Patient: Linda Sheppard 07/18/2012 10:23 AM   Signature: Patrick North, MD 07/18/2012 10:23 AM  Signature: 07/18/2012 10:23 AM  Signature: 07/18/2012 10:23 AM  Signature:Beverly Terrilee Croak, RN 07/18/2012 10:23 AM  Signature:  Neill Loft RN 07/18/2012 10:23 AM  Signature:  Juline Patch, LCSW 07/18/2012 10:23 AM  Signature:Maseta Dorley,Care Coordinator 07/18/2012 10:23 AM  Signature: Liliane Bade, BSW 07/18/2012 10:23 AM  Signature: Fransisca Kaufmann, [facility] 07/18/2012 10:23 AM  Signature:    Signature:    Signature:      Scribe for Treatment Team:   Juline Patch,  07/18/2012 10:23 AM

## 2012-07-18 NOTE — Progress Notes (Signed)
Discharge Note:  Patient denied SI and HI.  Denied A/V hallucinations.  Denied paint.  Patient discharged home by taxi.  Suicide prevention information given and discussed with patient, who stated she understood and had no questions.  Patient received her medications, prescriptions, clothing, toiletries, miscellaneous items.  Patient stated she appreciated all the staff has done to assist her while at [facility].

## 2012-07-18 NOTE — Progress Notes (Signed)
D:  Patient's self inventory sheet, patient sleeps well, has good appetite, normal energy level.  Rated depression and hopelessness #5.  Denied withdrawals.  Denied SI.  Pain goal today #8, worst pain #8.  Does have discharge plans.  No problems taking meds after discharge. A:  Medications administered per MD orders.  Staff will monitor every 15 minutes for safety.   Emotional support and encouragement given patient. R:  Patient denied SI and HI.   Denied A/V hallucinations.  Denied pain.   Is looking forward to discharge today.

## 2012-07-18 NOTE — Discharge Summary (Signed)
Physician Discharge Summary Note  Patient:  Linda Sheppard is an 37 y.o., female MRN:  [id number] DOB:  04-24-1975 Patient phone:  [phone] (home)  Patient address:   [address] [place] [address],   Date of Admission:  07/13/2012 Date of Discharge: 07/18/12  Reason for Admission:  Depression with SI, Substance Abuse  Discharge Diagnoses: Active Problems:   Chronic back pain greater than 3 months duration  Review of Systems  Constitutional: Negative.   HENT: Negative.   Eyes: Negative.   Respiratory: Negative.   Cardiovascular: Negative.   Gastrointestinal: Negative.   Genitourinary: Negative.   Musculoskeletal: Negative.   Skin: Negative.   Neurological: Negative.   Endo/Heme/Allergies: Negative.   Psychiatric/Behavioral: Positive for substance abuse. Negative for depression, suicidal ideas, hallucinations and memory loss. The patient is nervous/anxious. The patient does not have insomnia.    Axis Diagnosis:   AXIS I:  Major Depression, Recurrent severe and Substance Abuse AXIS II:  Deferred AXIS III:   Past Medical History  Diagnosis Date  . Obesity   . Hypertension   . Polysubstance abuse   . Bipolar 1 disorder   . Schizophrenia   . Depression   . Anxiety    AXIS IV:  other psychosocial or environmental problems AXIS V:  61-70 mild symptoms  Level of Care:  OP  Hospital Course: Linda Sheppard is an 37 y.o. female. Patient presents to [facility]; EMS: Pt's son called 911. Pt admitted suicidal ideations to her 14 year old daughter, but did not admit to EMS. Pt stated that she took 1 trazodone pills (100mg  pill) at 3am and 4 at 6:30am. EMS smelled alcohol on breath. Pt discharged from [facility] about 3 weeks ago. EMS found pt to be alert, but drowsy. Pt had 5 bouts of emesis en route. 22 PIV in left hand. Pt started on fluid bolus due to SBP of 78. After bolus, SBP 118.  Patient states that this was not a suicide attempt that she was only  trying to sleep. She admits telling her daughter that she was tired of being depressed, not being able to sleep and that she feels like things are getting worse. States that she hasn't been able to sleep since she was discharged from [facility] on 06/24/2012. States that she was prescribed Abilify by [facility] [place] last week but she can't afford the medication. States that she hasn't been out of the bed for the past 2 days, endorses decreased grooming, hopelessness, helplessness, decreased concentration and memory loss. Patient is currently employed part time at [employer]; but doesn't have the energy or motivation to get out of bed and go to work. She has lost 12 lbs since being discharged from [facility]. She states that she has no support system to speak of and has increasing financial stress due to not being able to function enough to work.    The duration of stay was five days. The patient was seen and evaluated by the Treatment team consisting of Psychiatrist, NP-C, RN, Case Manager, and Therapist for evaluation and treatment plan with goal of stabilization upon discharge. The patient's physical and mental health problems were identified and treated appropriately.      Multiple modalities of treatment were used including medication, individual and group therapies, unit programming, improved nutrition, physical activity, and family sessions as needed. The medications were managed by the MD. Patient initially reported not wanting to be on Abilify due to expense.  Her home medications were continued on admission. Patient was placed on Seroquel 50 mg at hs to help with mood stability and insomnia. Janelis was scheduled to be discharged on 07/16/12 but reported continued poor quality of sleep and excessive sleepiness in the morning. The patient then reported that she had done best when on abilify and asked to have that medication reinstated. In addition to abilify, elavil 25 mg was started for  pain,sleep and depression. Her neurontin schedule was also changed to include daytime doses as Calin complained that the medication effects were wearing off. Patient reported on the day of discharge that this combination of medications was effective for her.      The symptoms of depression were monitored daily by evaluation by clinical provider.  The patient's mental and emotional status was evaluated by a daily self inventory completed by the patient. Improvement was demonstrated by declining numbers on the self assessment, improving vital signs, increased cognition, and improvement in mood, sleep, appetite as well as a reduction in physical symptoms.       The patient was evaluated and found to be stable enough for discharge and was released to home per the initial plan of treatment. Patient spoke with the treatment team prior to her discharge and denied any SI. She reported that she would receive assistance with affording abilify through [facility]. Seraiah was deemed physically and emotionally stable for discharge. The patient received prescriptions for her new medications.   Mental Status Exam:  For mental status exam please see mental status exam and  suicide risk assessment completed by attending physician prior to discharge.   Mental Status Exam:  For mental status exam please see mental status exam and  suicide risk assessment completed by attending physician prior to discharge.    Consults:  None  Significant Diagnostic Studies:  labs: Chem profile, CBC, UDS positive for THC/Cocaine  Discharge Vitals:   Blood pressure 144/56, pulse 77, temperature 97.4 F (36.3 C), temperature source Oral, resp. rate 16, height 5' 6.5" (1.689 m), weight 136.986 kg (302 lb), last menstrual period 06/27/2012. Body mass index is 48.02 kg/(m^2). Lab Results:   No results found for this or any previous visit (from the past 72 hour(s)).  Physical Findings: AIMS: Facial and Oral Movements Muscles of  Facial Expression: None, normal Lips and Perioral Area: None, normal Jaw: None, normal Tongue: None, normal,Extremity Movements Upper (arms, wrists, hands, fingers): None, normal Lower (legs, knees, ankles, toes): None, normal, Trunk Movements Neck, shoulders, hips: None, normal, Overall Severity Severity of abnormal movements (highest score from questions above): None, normal Incapacitation due to abnormal movements: None, normal Patient's awareness of abnormal movements (rate only patient's report): No Awareness, Dental Status Current problems with teeth and/or dentures?: No Does patient usually wear dentures?: No  CIWA:  CIWA-Ar Total: 0 COWS:  COWS Total Score: 0  Psychiatric Specialty Exam: See Psychiatric Specialty Exam and Suicide Risk Assessment completed by Attending Physician prior to discharge.  Discharge destination:  Home  Is patient on multiple antipsychotic therapies at discharge:  No   Has Patient had three or more failed trials of antipsychotic monotherapy by history:  No  Recommended Plan for Multiple Antipsychotic Therapies: N/A      Discharge Orders   Future Orders Complete By Expires     Activity as tolerated - No restrictions  As directed         Medication List    STOP taking these medications       traZODone  100 MG tablet  Commonly known as:  DESYREL      TAKE these medications     Indication   amitriptyline 25 MG tablet  Commonly known as:  ELAVIL  Take 1 tablet (25 mg total) by mouth at bedtime. For sleep and pain control   Indication:  Depression, Trouble Sleeping, pain     ARIPiprazole 15 MG tablet  Commonly known as:  ABILIFY  Take 1 tablet (15 mg total) by mouth at bedtime. For mood control   Indication:  Mood Stability     DULoxetine 30 MG capsule  Commonly known as:  CYMBALTA  Take 1 capsule (30 mg total) by mouth 2 (two) times daily.   Indication:  Major Depressive Disorder, Musculoskeletal Pain     gabapentin 300 MG capsule   Commonly known as:  NEURONTIN  Take 2 capsules (600 mg total) by mouth at bedtime.   Indication:  Alcohol Withdrawal Syndrome, neuropathic pain     gabapentin 100 MG capsule  Commonly known as:  NEURONTIN  Take 2 capsules (200 mg total) by mouth 3 (three) times daily.   Indication:  Agitation, Pain     hydrochlorothiazide 25 MG tablet  Commonly known as:  HYDRODIURIL  Take 1 tablet (25 mg total) by mouth daily.   Indication:  High Blood Pressure     hydrOXYzine 50 MG tablet  Commonly known as:  ATARAX/VISTARIL  Take 1 tablet (50 mg total) by mouth at bedtime and may repeat dose one time if needed.   Indication:  sleep     lidocaine 5 %  Commonly known as:  LIDODERM  Place 1 patch onto the skin daily before breakfast. Remove & Discard patch within 12 hours or as directed by MD   Indication:  Chronic back pain       Follow-up Information   Follow up with [facility] On 07/22/2012. (You are scheduled with LaTasha on Friday, Jul 22, 2012 at 11AM)    Contact information:   [address], [place]  [address]  [phone]      Follow up with [facility].   Contact information:   [address] [place], [place]   [address]  [phone]      Follow-up recommendations:  Activity:  Resume usual activities Diet:  Regular  Comments:   Take all your medications as prescribed by your mental healthcare provider.  Report any adverse effects and or reactions from your medicines to your outpatient provider promptly.  Patient is instructed and cautioned to not engage in alcohol and or illegal drug use while on prescription medicines.  In the event of worsening symptoms, patient is instructed to call the crisis hotline, 911 and or go to the nearest ED for appropriate evaluation and treatment of symptoms.  Follow-up with your primary care provider for your other medical issues, concerns and or health care needs.     Total Discharge Time:  Greater than 30  minutes.  SignedFransisca Kaufmann NP-C 07/18/2012, 11:22 AM

## 2012-07-19 NOTE — Progress Notes (Signed)
I agree with this note. I certify that inpatient services furnished can reasonably be expected to improve the patient's condition. Kenedee Molesky, MD, MSPH  

## 2012-07-20 NOTE — Progress Notes (Signed)
Patient Discharge Instructions:  After Visit Summary (AVS):   Faxed to:  07/20/12 Discharge Summary Note:   Faxed to:  07/20/12 Psychiatric Admission Assessment Note:   Faxed to:  07/20/12 Suicide Risk Assessment - Discharge Assessment:   Faxed to:  07/20/12 Faxed/Sent to the Next Level Care provider:  07/20/12 Faxed to [facility] [place] @ [phone] Faxed to [facility] @ [phone]  Jerelene Redden, 07/20/2012, 3:32 PM

## 2012-10-04 ENCOUNTER — Encounter (HOSPITAL_COMMUNITY): Payer: Self-pay | Admitting: *Deleted

## 2012-10-04 ENCOUNTER — Emergency Department (HOSPITAL_COMMUNITY)
Admission: EM | Admit: 2012-10-04 | Discharge: 2012-10-04 | Disposition: A | Discharge status: Other Acute Inpatient Hospital | Payer: Self-pay | Attending: Emergency Medicine | Admitting: Emergency Medicine

## 2012-10-04 ENCOUNTER — Inpatient Hospital Stay (HOSPITAL_COMMUNITY)
Admission: AD | Admit: 2012-10-04 | Discharge: 2012-10-06 | DRG: 885 | Disposition: A | Discharge status: Home / Self Care | Payer: No Typology Code available for payment source | Source: Intra-hospital | Attending: Psychiatry | Admitting: Psychiatry

## 2012-10-04 ENCOUNTER — Encounter (HOSPITAL_COMMUNITY): Payer: Self-pay | Admitting: Emergency Medicine

## 2012-10-04 DIAGNOSIS — F1410 Cocaine abuse, uncomplicated: Secondary | ICD-10-CM | POA: Diagnosis present

## 2012-10-04 DIAGNOSIS — I10 Essential (primary) hypertension: Secondary | ICD-10-CM | POA: Insufficient documentation

## 2012-10-04 DIAGNOSIS — F17200 Nicotine dependence, unspecified, uncomplicated: Secondary | ICD-10-CM | POA: Insufficient documentation

## 2012-10-04 DIAGNOSIS — Z79899 Other long term (current) drug therapy: Secondary | ICD-10-CM

## 2012-10-04 DIAGNOSIS — F331 Major depressive disorder, recurrent, moderate: Principal | ICD-10-CM | POA: Diagnosis present

## 2012-10-04 DIAGNOSIS — F319 Bipolar disorder, unspecified: Secondary | ICD-10-CM | POA: Diagnosis present

## 2012-10-04 DIAGNOSIS — Z3202 Encounter for pregnancy test, result negative: Secondary | ICD-10-CM | POA: Insufficient documentation

## 2012-10-04 DIAGNOSIS — F209 Schizophrenia, unspecified: Secondary | ICD-10-CM | POA: Insufficient documentation

## 2012-10-04 DIAGNOSIS — F411 Generalized anxiety disorder: Secondary | ICD-10-CM | POA: Insufficient documentation

## 2012-10-04 DIAGNOSIS — E669 Obesity, unspecified: Secondary | ICD-10-CM | POA: Diagnosis present

## 2012-10-04 DIAGNOSIS — F1010 Alcohol abuse, uncomplicated: Secondary | ICD-10-CM | POA: Diagnosis present

## 2012-10-04 DIAGNOSIS — R45851 Suicidal ideations: Secondary | ICD-10-CM

## 2012-10-04 DIAGNOSIS — K0889 Other specified disorders of teeth and supporting structures: Secondary | ICD-10-CM

## 2012-10-04 DIAGNOSIS — K089 Disorder of teeth and supporting structures, unspecified: Secondary | ICD-10-CM | POA: Insufficient documentation

## 2012-10-04 LAB — CBC WITH DIFFERENTIAL/PLATELET
Basophils Relative: 0 % (ref 0–1)
HCT: 36.3 % (ref 36.0–46.0)
Hemoglobin: 12.7 g/dL (ref 12.0–15.0)
MCH: 27.3 pg (ref 26.0–34.0)
MCHC: 35 g/dL (ref 30.0–36.0)
MCV: 77.9 fL — ABNORMAL LOW (ref 78.0–100.0)
Monocytes Absolute: 0.7 10*3/uL (ref 0.1–1.0)
Monocytes Relative: 8 % (ref 3–12)
Neutro Abs: 5.2 10*3/uL (ref 1.7–7.7)

## 2012-10-04 LAB — BASIC METABOLIC PANEL
BUN: 9 mg/dL (ref 6–23)
Chloride: 102 mEq/L (ref 96–112)
Creatinine, Ser: 0.96 mg/dL (ref 0.50–1.10)
GFR calc Af Amer: 88 mL/min — ABNORMAL LOW (ref >90)

## 2012-10-04 LAB — URINALYSIS, ROUTINE W REFLEX MICROSCOPIC
Ketones, ur: NEGATIVE mg/dL
Leukocytes, UA: NEGATIVE
Nitrite: NEGATIVE
Protein, ur: NEGATIVE mg/dL
Urobilinogen, UA: 0.2 mg/dL (ref 0.0–1.0)
pH: 5.5 (ref 5.0–8.0)

## 2012-10-04 LAB — RAPID URINE DRUG SCREEN, HOSP PERFORMED
Amphetamines: NOT DETECTED
Barbiturates: NOT DETECTED
Benzodiazepines: NOT DETECTED

## 2012-10-04 LAB — POCT PREGNANCY, URINE: Preg Test, Ur: NEGATIVE

## 2012-10-04 MED ORDER — ARIPIPRAZOLE 15 MG PO TABS
15.0000 mg | ORAL_TABLET | Freq: Every day | ORAL | Status: DC
  Administered 2012-10-04: 15 mg via ORAL
  Filled 2012-10-04: qty 1

## 2012-10-04 MED ORDER — AMITRIPTYLINE HCL 25 MG PO TABS
25.0000 mg | ORAL_TABLET | Freq: Every day | ORAL | Status: DC
  Administered 2012-10-04: 25 mg via ORAL
  Filled 2012-10-04: qty 1

## 2012-10-04 MED ORDER — HYDROXYZINE HCL 25 MG PO TABS
50.0000 mg | ORAL_TABLET | Freq: Every evening | ORAL | Status: DC | PRN
  Administered 2012-10-04: 50 mg via ORAL
  Filled 2012-10-04: qty 2

## 2012-10-04 MED ORDER — ACETAMINOPHEN 325 MG PO TABS: 650.0000 mg | ORAL_TABLET | ORAL | Status: DC | PRN

## 2012-10-04 MED ORDER — HYDROCHLOROTHIAZIDE 25 MG PO TABS
25.0000 mg | ORAL_TABLET | Freq: Every day | ORAL | Status: DC
  Administered 2012-10-05 – 2012-10-06 (×2): 25 mg via ORAL
  Filled 2012-10-04 (×4): qty 1

## 2012-10-04 MED ORDER — ONDANSETRON HCL 4 MG PO TABS: 4.0000 mg | ORAL_TABLET | Freq: Three times a day (TID) | ORAL | Status: DC | PRN

## 2012-10-04 MED ORDER — CHLORDIAZEPOXIDE HCL 25 MG PO CAPS: 25.0000 mg | ORAL_CAPSULE | Freq: Four times a day (QID) | ORAL | Status: DC | PRN

## 2012-10-04 MED ORDER — GABAPENTIN 300 MG PO CAPS
300.0000 mg | ORAL_CAPSULE | Freq: Every day | ORAL | Status: DC
  Administered 2012-10-04: 300 mg via ORAL
  Filled 2012-10-04: qty 1

## 2012-10-04 MED ORDER — IBUPROFEN 400 MG PO TABS
600.0000 mg | ORAL_TABLET | Freq: Three times a day (TID) | ORAL | Status: DC | PRN
  Administered 2012-10-04: 600 mg via ORAL
  Filled 2012-10-04: qty 1

## 2012-10-04 MED ORDER — PENICILLIN V POTASSIUM 250 MG PO TABS: 500.0000 mg | ORAL_TABLET | Freq: Once | ORAL | Status: DC

## 2012-10-04 MED ORDER — GABAPENTIN 100 MG PO CAPS
200.0000 mg | ORAL_CAPSULE | Freq: Three times a day (TID) | ORAL | Status: DC
  Administered 2012-10-05: 200 mg via ORAL
  Filled 2012-10-04 (×6): qty 2

## 2012-10-04 MED ORDER — HYDROCHLOROTHIAZIDE 25 MG PO TABS
25.0000 mg | ORAL_TABLET | Freq: Every day | ORAL | Status: DC
  Administered 2012-10-04: 25 mg via ORAL
  Filled 2012-10-04: qty 1

## 2012-10-04 MED ORDER — DULOXETINE HCL 30 MG PO CPEP
30.0000 mg | ORAL_CAPSULE | Freq: Two times a day (BID) | ORAL | Status: DC
  Administered 2012-10-04 (×2): 30 mg via ORAL
  Filled 2012-10-04 (×2): qty 1

## 2012-10-04 MED ORDER — DULOXETINE HCL 30 MG PO CPEP
30.0000 mg | ORAL_CAPSULE | Freq: Two times a day (BID) | ORAL | Status: DC
Start: 2012-10-05 — End: 2012-10-05
  Administered 2012-10-05: 30 mg via ORAL
  Filled 2012-10-04 (×3): qty 1

## 2012-10-04 MED ORDER — NICOTINE 21 MG/24HR TD PT24: 21.0000 mg | MEDICATED_PATCH | Freq: Every day | TRANSDERMAL | Status: DC

## 2012-10-04 MED ORDER — MAGNESIUM HYDROXIDE 400 MG/5ML PO SUSP: 30.0000 mL | Freq: Every day | ORAL | Status: DC | PRN

## 2012-10-04 MED ORDER — ALUM & MAG HYDROXIDE-SIMETH 200-200-20 MG/5ML PO SUSP: 30.0000 mL | ORAL | Status: DC | PRN

## 2012-10-04 MED ORDER — PENICILLIN V POTASSIUM 250 MG PO TABS
500.0000 mg | ORAL_TABLET | Freq: Three times a day (TID) | ORAL | Status: DC
  Administered 2012-10-04 (×2): 500 mg via ORAL
  Filled 2012-10-04 (×2): qty 2

## 2012-10-04 MED ORDER — GABAPENTIN 100 MG PO CAPS
200.0000 mg | ORAL_CAPSULE | Freq: Four times a day (QID) | ORAL | Status: DC
  Administered 2012-10-04 (×2): 200 mg via ORAL
  Filled 2012-10-04 (×3): qty 2

## 2012-10-04 MED ORDER — ACETAMINOPHEN 325 MG PO TABS
650.0000 mg | ORAL_TABLET | Freq: Four times a day (QID) | ORAL | Status: DC | PRN
  Administered 2012-10-05 – 2012-10-06 (×2): 650 mg via ORAL

## 2012-10-04 NOTE — ED Notes (Signed)
PATIENT HAS BEEN MOVED TO POD C FOR FURTHER OBSERVATION AND AS A SUICIDAL HOLD UNTIL A PSYCH BED CAN BE FOUND FOR HER. SHE IS ACCOMPANIED BY HER FAMILY. THE PATIENT HAS A SITTER. HER ROOM IS NOT IN  DIRECT VIEW OF NURSES STATION. REPORTING NURSE AWARE.

## 2012-10-04 NOTE — ED Provider Notes (Signed)
Medical screening examination/treatment/procedure(s) were performed by non-physician practitioner and as supervising physician I was immediately available for consultation/collaboration.   Gwyneth Sprout, MD 10/04/12 [phone]
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
## 2012-10-04 NOTE — ED Provider Notes (Signed)
CSN: [id number]     Arrival date & time 10/04/12  1053 History     First MD Initiated Contact with Patient 10/04/12 1148     Chief Complaint  Patient presents with  . Suicidal  . Dental Pain   (Consider location/radiation/quality/duration/timing/severity/associated sxs/prior Treatment) HPI Comments: Patient with history of suicidal ideation, suicide attempt presents today with complaint of worsening depression and suicidal thoughts and became worse over the past few days and was worse this morning. Patient states that she had a plan to overdose on trazodone. She otherwise denies medical complaints except for right sided, lower dental pain that she has had for several days. Patient reports that she has been off her psychiatric medication except for Cymbalta for the past week. She is out of her other medications. Patient states that she sees family services for her medications. Onset of symptoms gradual. Course is worsening. Nothing makes symptoms better or worse.  The history is provided by the patient.    Past Medical History  Diagnosis Date  . Obesity   . Hypertension   . Polysubstance abuse   . Bipolar 1 disorder   . Schizophrenia   . Depression   . Anxiety    Past Surgical History  Procedure Laterality Date  . Tubal ligation    . Cesarean section     No family history on file. History  Substance Use Topics  . Smoking status: Current Every Day Smoker -- 0.15 packs/day    Types: Cigarettes  . Smokeless tobacco: Not on file  . Alcohol Use: Yes     Comment: daily 6-7 24 oz beers   OB History   Grav Para Term Preterm Abortions TAB SAB Ect Mult Living                 Review of Systems  Constitutional: Negative for fever.  HENT: Positive for dental problem. Negative for ear pain, sore throat, facial swelling, rhinorrhea, trouble swallowing and neck pain.   Eyes: Negative for redness.  Respiratory: Negative for cough, shortness of breath and stridor.   Cardiovascular:  Negative for chest pain.  Gastrointestinal: Negative for nausea, vomiting, abdominal pain and diarrhea.  Genitourinary: Negative for dysuria.  Musculoskeletal: Negative for myalgias.  Skin: Negative for color change and rash.  Neurological: Negative for headaches.  Psychiatric/Behavioral: Positive for suicidal ideas.    Allergies  Tramadol and Ceftin  Home Medications   Current Outpatient Rx  Name  Route  Sig  Dispense  Refill  . amitriptyline (ELAVIL) 25 MG tablet   Oral   Take 1 tablet (25 mg total) by mouth at bedtime. For sleep and pain control   30 tablet   0   . ARIPiprazole (ABILIFY) 15 MG tablet   Oral   Take 1 tablet (15 mg total) by mouth at bedtime. For mood control   30 tablet   0   . DULoxetine (CYMBALTA) 30 MG capsule   Oral   Take 1 capsule (30 mg total) by mouth 2 (two) times daily.   60 capsule   0   . gabapentin (NEURONTIN) 100 MG capsule   Oral   Take 200 mg by mouth 4 (four) times daily. Take 1 capsule at 8am, 12noon, 4pm, and 9pm         . gabapentin (NEURONTIN) 300 MG capsule   Oral   Take 300 mg by mouth at bedtime.         . hydrochlorothiazide (HYDRODIURIL) 25 MG tablet   Oral  Take 1 tablet (25 mg total) by mouth daily.         . hydrOXYzine (ATARAX/VISTARIL) 50 MG tablet   Oral   Take 1 tablet (50 mg total) by mouth at bedtime and may repeat dose one time if needed.   30 tablet   0    BP 153/100  Pulse 114  Temp(Src) 98.8 F (37.1 C) (Oral)  Resp 18  SpO2 95%  LMP 09/24/2012 Physical Exam  Nursing note and vitals reviewed. Constitutional: She appears well-developed and well-nourished.  HENT:  Head: Normocephalic and atraumatic. No trismus in the jaw.  Right Ear: Tympanic membrane, external ear and ear canal normal.  Left Ear: Tympanic membrane, external ear and ear canal normal.  Nose: Nose normal.  Mouth/Throat: Uvula is midline, oropharynx is clear and moist and mucous membranes are normal. Abnormal dentition.  Dental caries present. No dental abscesses or edematous. No tonsillar abscesses.  Patient with R mandibular tooth pain and tenderness to palpation in area of 2nd molar. No swelling on exam. There is mild erythema.   Eyes: Conjunctivae are normal. Right eye exhibits no discharge. Left eye exhibits no discharge.  Neck: Normal range of motion. Neck supple.  No neck swelling or Ludwig's angina  Cardiovascular: Normal rate, regular rhythm and normal heart sounds.   Pulmonary/Chest: Effort normal and breath sounds normal.  Abdominal: Soft. There is no tenderness.  Lymphadenopathy:    She has no cervical adenopathy.  Neurological: She is alert.  Skin: Skin is warm and dry.  Psychiatric: She has a normal mood and affect.    ED Course   Procedures (including critical care time)  Labs Reviewed  CBC WITH DIFFERENTIAL - Abnormal; Notable for the following:    MCV 77.9 (*)    All other components within normal limits  BASIC METABOLIC PANEL - Abnormal; Notable for the following:    GFR calc non Af Amer 76 (*)    GFR calc Af Amer 88 (*)    All other components within normal limits  ETHANOL - Abnormal; Notable for the following:    Alcohol, Ethyl (B) 64 (*)    All other components within normal limits  URINE RAPID DRUG SCREEN (HOSP PERFORMED) - Abnormal; Notable for the following:    Cocaine POSITIVE (*)    Tetrahydrocannabinol POSITIVE (*)    All other components within normal limits  URINALYSIS, ROUTINE W REFLEX MICROSCOPIC - Abnormal; Notable for the following:    APPearance HAZY (*)    All other components within normal limits  POCT PREGNANCY, URINE   No results found. 1. Suicidal ideation   2. Pain, dental     12:00 PM Patient seen and examined. Work-up initiated. Medications ordered. Holding orders complete.   Vital signs reviewed and are as follows: Filed Vitals:   10/04/12 1100  BP: 153/100  Pulse: 114  Temp: 98.8 F (37.1 C)  Resp: 18   Note: Pt will need to be  discharged home with an RX of penicillin if discharged.  2:06 PM Pt medically cleared. Spoke with ACT who will see.   MDM  ACT to see for suicidal ideation.   Dental pain - no gross abscess. Feel patient will benefit from course of penicillin as she does have erythema of gumline with tenderness. No gross facial swelling.   Renne Crigler, PA-C 10/04/12 1407

## 2012-10-04 NOTE — ED Notes (Signed)
Called [facility] for a sitter.

## 2012-10-04 NOTE — Progress Notes (Signed)
Patient ID: Linda Sheppard, female   DOB: May 08, 1975, 37 y.o.   MRN: [id number] Pt admitted to [facility] with c/o suicidal ideation.  Pt denies attempt and/or plan prior to admission.  Pt reported she has had prior attempts.  Pt reported she was admitted here in May of this year.  Pt reported she has a lot going on right now and stressor includes the fact that she is homeless.  Pt reported she lost her job during the time she was admitted in May.  Pt reports she is currently living with family members. Pt's BP was elevated during admission and pt reported she has a history of HTN and has been without blood pressure medications for several days prior to admission because she ran out of them.  Pt also with c/o tooth pain.  Pt initially stated she didn't understand why she wouldn't be on [address] and when she was questioned about etoh or drug use shrugged her shoulders and stated well yes etoh.  Pt reported she drank 48 oz beer in the last few days and etoh use is sporadic and sometimes goes weeks without drinking.  Pt also reported sporadic use of cocaine with the last being $20 worth 5 days ago.  Fifteen minute checks initiated.  Pt safe on unit.

## 2012-10-04 NOTE — ED Notes (Signed)
Pt. Stated while crying, I'm having suicidal thoughts, and I've tried to kill myself before and I think I could do that again.  I was evicted and I'm depressed all the time.  I just don't know what to do anywhere.  I have a bad toothache also.

## 2012-10-04 NOTE — BH Assessment (Signed)
[facility] Assessment Progress Note   Patient has been accepted to [facility] by Dr. Lucianne Muss to Dr. Dub Mikes.  Room 306-2 assigned.  Dr. Karma Ganja (EDP) notified as well as nursing staff.  Patient has signed her voluntary admission papers.

## 2012-10-04 NOTE — ED Notes (Signed)
Security wanded pt ?

## 2012-10-04 NOTE — Tx Team (Signed)
Initial Interdisciplinary Treatment Plan  PATIENT STRENGTHS: (choose at least two) Ability for insight Active sense of humor Average or above average intelligence Capable of independent living Communication skills Motivation for treatment/growth Physical Health Religious Affiliation Work skills  PATIENT STRESSORS: Financial difficulties Medication change or noncompliance Substance abuse   PROBLEM LIST: Problem List/Patient Goals Date to be addressed Date deferred Reason deferred Estimated date of resolution  "I need some to get my mind right" 10/04/12           Substance abuse 10/04/12     Increased risk for suicide 10/04/12     Depression 10/04/12                              DISCHARGE CRITERIA:  Ability to meet basic life and health needs Adequate post-discharge living arrangements Improved stabilization in mood, thinking, and/or behavior Medical problems require only outpatient monitoring Motivation to continue treatment in a less acute level of care Need for constant or close observation no longer present Reduction of life-threatening or endangering symptoms to within safe limits Safe-care adequate arrangements made Verbal commitment to aftercare and medication compliance Withdrawal symptoms are absent or subacute and managed without 24-hour nursing intervention  PRELIMINARY DISCHARGE PLAN: Attend aftercare/continuing care group Attend 12-step recovery group Outpatient therapy Participate in family therapy Return to previous living arrangement  PATIENT/FAMIILY INVOLVEMENT: This treatment plan has been presented to and reviewed with the patient, Linda Sheppard, and/or family member.  The patient and family have been given the opportunity to ask questions and make suggestions.  Fransico Michael [facility] 10/04/2012, 11:43 PM

## 2012-10-05 ENCOUNTER — Encounter (HOSPITAL_COMMUNITY): Payer: Self-pay | Admitting: Psychiatry

## 2012-10-05 DIAGNOSIS — F39 Unspecified mood [affective] disorder: Secondary | ICD-10-CM

## 2012-10-05 DIAGNOSIS — F339 Major depressive disorder, recurrent, unspecified: Secondary | ICD-10-CM

## 2012-10-05 DIAGNOSIS — F1010 Alcohol abuse, uncomplicated: Secondary | ICD-10-CM

## 2012-10-05 DIAGNOSIS — F1410 Cocaine abuse, uncomplicated: Secondary | ICD-10-CM | POA: Diagnosis present

## 2012-10-05 MED ORDER — DULOXETINE HCL 60 MG PO CPEP
60.0000 mg | ORAL_CAPSULE | Freq: Every day | ORAL | Status: DC
  Administered 2012-10-06: 60 mg via ORAL
  Filled 2012-10-05 (×2): qty 1
  Filled 2012-10-05: qty 14

## 2012-10-05 MED ORDER — AMITRIPTYLINE HCL 50 MG PO TABS
50.0000 mg | ORAL_TABLET | Freq: Every day | ORAL | Status: DC
  Administered 2012-10-05: 50 mg via ORAL
  Filled 2012-10-05 (×2): qty 1
  Filled 2012-10-05: qty 14

## 2012-10-05 MED ORDER — DULOXETINE HCL 60 MG PO CPEP
60.0000 mg | ORAL_CAPSULE | Freq: Two times a day (BID) | ORAL | Status: DC
  Filled 2012-10-05 (×2): qty 1

## 2012-10-05 MED ORDER — DULOXETINE HCL 60 MG PO CPEP: 60.0000 mg | ORAL_CAPSULE | Freq: Every day | ORAL | Status: DC

## 2012-10-05 MED ORDER — ARIPIPRAZOLE 15 MG PO TABS
15.0000 mg | ORAL_TABLET | Freq: Every day | ORAL | Status: DC
  Administered 2012-10-05: 15 mg via ORAL
  Filled 2012-10-05 (×2): qty 1
  Filled 2012-10-05: qty 14

## 2012-10-05 MED ORDER — GABAPENTIN 300 MG PO CAPS
300.0000 mg | ORAL_CAPSULE | Freq: Every day | ORAL | Status: DC
  Administered 2012-10-05: 300 mg via ORAL
  Filled 2012-10-05 (×3): qty 1

## 2012-10-05 MED ORDER — GABAPENTIN 100 MG PO CAPS
200.0000 mg | ORAL_CAPSULE | Freq: Three times a day (TID) | ORAL | Status: DC
  Administered 2012-10-05 – 2012-10-06 (×4): 200 mg via ORAL
  Filled 2012-10-05: qty 84
  Filled 2012-10-05: qty 2
  Filled 2012-10-05 (×2): qty 84
  Filled 2012-10-05 (×5): qty 2

## 2012-10-05 NOTE — [facility] Group Notes (Signed)
[facility] LCSW Group Therapy  10/05/2012 4:36 PM  Type of Therapy:  Group Therapy  Participation Level:  Did Not Attend   Smart, Herbert Seta 10/05/2012, 4:36 PM

## 2012-10-05 NOTE — Progress Notes (Signed)
Patient ID: Linda Sheppard, female   DOB: February 14, 1976, 37 y.o.   MRN: [id number] She has been in bed majority of the day. She did attend one group. At this time she stated that she has her sleep out and she feels fine.
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
## 2012-10-05 NOTE — Progress Notes (Signed)
Patient ID: Linda Sheppard, female   DOB: May 26, 1975, 37 y.o.   MRN: [id number]  Pt was pleasant and cooperative, but drowsy during the adm process. When asked the circumstances surrounding her adm, pt stated that she hadn't been taking her meds since her discharge from [facility] in May. Stated her night meds, "stick with her all the next day". Stated she doesn't like the way the meds make her feel. Writer had to repeat questions several times, however pt was pleasant. Pt's bp was elevated at 146/101 pulse 85 and 150/111 pulse 103.  Writer spoke to extender who requested that pt be allowed to sleep due to the time vitals were noticed. Informed writer to have morning shift assess. Pt denied SI, HI, AV

## 2012-10-05 NOTE — Progress Notes (Signed)
NUTRITION ASSESSMENT  Pt identified as at risk on the Malnutrition Screen Tool  INTERVENTION: 1. Educated patient on the importance of nutrition and encouraged intake of food and beverages. 2. Discussed weight goals.  NUTRITION DIAGNOSIS: Obesity related to increased energy intake AEB BMI of 53.  Goal: Pt to meet >/= 90% of their estimated nutrition needs.  Monitor:  PO intake  Assessment:  Patient admitted with depression.  Known from last admit.  Patient reports weight gain prior to admit secondary to using food as a "skape goat".  At times eats with depression and other times restricts per history.  Has had nutrition education on previous admits and currently education is not appropriate.  Patient with increased depression.  37 y.o. female  Height: Ht Readings from Last 1 Encounters:  10/04/12 5' 4.96" (1.65 m)    Weight: Wt Readings from Last 1 Encounters:  10/04/12 320 lb (145.151 kg)    Weight Hx: Wt Readings from Last 10 Encounters:  10/04/12 320 lb (145.151 kg)  07/14/12 302 lb (136.986 kg)  07/10/12 302 lb (136.986 kg)  06/19/12 301 lb (136.533 kg)  06/19/12 302 lb 6 oz (137.156 kg)    BMI:  Body mass index is 53.32 kg/(m^2). Pt meets criteria for extreme obesity based on current BMI.  Estimated Nutritional Needs: Kcal: 25-30 kcal/kg Protein: > 1 gram protein/kg Fluid: 1 ml/kcal  Diet Order: General Pt is also offered choice of unit snacks mid-morning and mid-afternoon.  Pt is eating as desired.   Lab results and medications reviewed.   Oran Rein, RD, LDN Clinical Inpatient Dietitian Pager:  [phone] Weekend and after hours pager:  [phone]

## 2012-10-05 NOTE — Tx Team (Signed)
Interdisciplinary Treatment Plan Update (Adult)  Date: 10/05/2012   Time Reviewed: 12:53 PM  Progress in Treatment:  Attending groups: Yes Participating in groups:  Yes Taking medication as prescribed: Yes  Tolerating medication: Yes  Family/Significant othe contact made: Not yet. SPE required for this pt.   Patient understands diagnosis: Yes, AEB seeking treatment for depression, substance abuse (alcohol and cocaine), and medication stabilization. Discussing patient identified problems/goals with staff: Yes  Medical problems stabilized or resolved: Yes  Denies suicidal/homicidal ideation: Yes, in group/self report.  Patient has not harmed self or Others: Yes  New problem(s) identified: n/a Discharge Plan or Barriers: Pt follows up with [facility] for med management and therapy. She plans to return home where she lives with her mother and will continue with [facility].  Additional comments: Linda Sheppard is an 37 y.o. female that was self-referred to [facility] due to increasing depression over the last three days by report and SI with plan to overdose on her Trazadone. Pt stated she felt "better" after being discharged from [facility] 5/14, but has recently felt worse. Pt stated she has a lot of stressors contributing to her depression, like losing her job due to being in inpatient treatment in May, financial stress, and poor sleep. Pt stated she does not take her sleep medications as prescribed by [facility] "because they make me feel like a zombie." Pt stated she takes the other medications (Abilify, Neurontin, Cymbalta) as prescribed, just not the Elavil or Vistaril. Pt stated she takes her medication for high blood pressure, but is out of her pain medication for her back and also complained of a toothache upon arrival. Pt denies HI or psychosis. Pt admits to increased sleep (sleeps all day by report), sad mood, decreased grooming and low motivation. Pt stated she did get her part time job back and wants to move back out on  her own because "my mom just nags and nags." Pt stated her children are with a friend and her father. Pt admits to SA, stating she drinks 48 oz of [place] beer every two weeks, last use last night and $20 cocaine monthly, last use 4 days ago by report. Pt's UDS positive for THC, but pt denies use. Pt stated she has not followed up with her outpatient treatment by [facility] because she has not been able to afford it and has felt depressed.  Reason for Continuation of Hospitalization: Mood stabilization Medication management Estimated length of stay: 2-4 days  For review of initial/current patient goals, please see plan of care.  Attendees:  Patient:    Family:    Physician: Geoffery Lyons MD 10/05/2012 12:53 PM   Nursing: Maureen Ralphs RN 10/05/2012 12:53 PM   Clinical Social Worker Cherelle Midkiff Smart, LCSWA  10/05/2012 12:53 PM   Other: Lupita Leash RN  10/05/2012 12:53 PM   Other: Aggie PA 10/05/2012 12:53 PM   Other: Darden Dates Nurse CM 10/05/2012 12:53 PM   Other:    Scribe for Treatment Team:  Trula Slade LCSWA 10/05/2012 12:53 PM

## 2012-10-05 NOTE — Progress Notes (Signed)
Pt observed in her room in bed.  She easily responded to her name called.  She reports that the medicine is making her sleepy and she has not been out of bed much today.  She denies SI/HI/AV.  She reports no withdrawal symptoms.  She did not attend any groups today.  She is unsure of her discharge plans.  Pt was encouraged to make her needs known to staff.  Support and encouragement offered.  Safety maintained with q15 minute checks.
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
## 2012-10-05 NOTE — BHH Group Notes (Signed)
[facility] LCSW Aftercare Discharge Planning Group Note   10/05/2012 9:35 AM  Participation Quality:  Appropriate   Mood/Affect:  Depressed  Depression Rating:  1  Anxiety Rating:  0  Thoughts of Suicide:  No Will you contract for safety?   NA  Current AVH:  No  Plan for Discharge/Comments:  Pt stated that she decided to seek treatment after increase in depression, crying spells, and "nervous breakdown." She lives with her mother in [place] and plans to return home after d/c. She follows up with [facility] for med management and therapy. Pt states that she sees Para March and Roxine Caddy for therapy but cannot remember her psychiatrist.   Transportation Means: mother  Supports: mother/family supports  [employer], [employer]

## 2012-10-05 NOTE — BHH Counselor (Signed)
Adult Psychosocial Assessment Update Interdisciplinary Team  Previous [facility] admissions/discharges:  Admissions Discharges  Date: 5/14 Date:  Date: 4/14 Date:  Date: Date:  Date: Date:  Date: Date:   Changes since the last Psychosocial Assessment (including adherence to outpatient mental health and/or substance abuse treatment, situational issues contributing to decompensation and/or relapse). 37 Y/O female who was here in May. She was let go of her job as the information of her being out sick did not get to the computer. She was not able to keep her apartment. Went to stay with her mother. States this is a negative environment for her. States her mother is a "b----." She went off her night medications. She got increasingly more depressed. She got her job back three weeks ago. Yesterday morning had a "break down," after her mother was on her case because she came home late. She had thoughts of self harm, and told her mother who responded "go ahead." She states she knew she needed help.              Discharge Plan 1. Will you be returning to the same living situation after discharge?   Yes:X No:      If no, what is your plan?           2. Would you like a referral for services when you are discharged? Yes: X    If yes, for what services?  No:       Goes to [employer] and Recommendations (to be completed by the evaluator) Rea is a 36 YO AA female with a dependent who is admitted for SI.  It should be noted that each time Bee has been admitted, plus one ED stay, she has been positive for alcohol, cocaine and cannabis.  She minimizes this, saying that she does not use regularly, and it is only due to her depression that she uses.  Also states she is unable to afford her meds, and so is non-compliant outside of the hospital.  She can benefit from crises stabilization, medication management, therapeutic milieu and referral for services.                       Signature:  Ida Rogue, 10/05/2012 4:02 PM

## 2012-10-05 NOTE — Progress Notes (Signed)
Patient ID: Linda Sheppard, female   DOB: 10-26-1975, 37 y.o.   MRN: [id number]  Pt did not attend group. She was asleep in bed.

## 2012-10-05 NOTE — Progress Notes (Addendum)
Pt did not attend NA meeting this evening.  

## 2012-10-06 DIAGNOSIS — F331 Major depressive disorder, recurrent, moderate: Principal | ICD-10-CM

## 2012-10-06 DIAGNOSIS — F1020 Alcohol dependence, uncomplicated: Secondary | ICD-10-CM

## 2012-10-06 MED ORDER — BENZOCAINE 10 % MT GEL: Freq: Three times a day (TID) | OROMUCOSAL | Status: DC | PRN

## 2012-10-06 MED ORDER — ARIPIPRAZOLE 15 MG PO TABS: 15.0000 mg | ORAL_TABLET | Freq: Every day | ORAL | Status: DC

## 2012-10-06 MED ORDER — AMITRIPTYLINE HCL 50 MG PO TABS: 50.0000 mg | ORAL_TABLET | Freq: Every day | ORAL | Status: DC

## 2012-10-06 MED ORDER — DULOXETINE HCL 60 MG PO CPEP: 60.0000 mg | ORAL_CAPSULE | Freq: Every day | ORAL | Status: DC

## 2012-10-06 MED ORDER — GABAPENTIN 100 MG PO CAPS: ORAL_CAPSULE | ORAL | Status: DC

## 2012-10-06 MED ORDER — HYDROCHLOROTHIAZIDE 25 MG PO TABS: 25.0000 mg | ORAL_TABLET | Freq: Every day | ORAL | Status: DC

## 2012-10-06 NOTE — Progress Notes (Signed)
Patient ID: LEXI CONATY, female   DOB: Jan 01, 1976, 37 y.o.   MRN: [id number] She has been discharged and was picked up by a female friend (B/F) per her. She voiced understanding of discharge teaching on medications, fall safety, and follow up  Treatment plan. She denies thought of SI and all belongings were taken home with her.

## 2012-10-06 NOTE — Discharge Summary (Signed)
Physician Discharge Summary Note  Patient:  Linda Sheppard is an 37 y.o., female MRN:  [id number] DOB:  1976/03/03 Patient phone:  [phone] (home)  Patient address:   [address] [place] [address],   Date of Admission:  10/04/2012 Date of Discharge: 10/06/12  Reason for Admission: Suicidal ideations  Discharge Diagnoses: Active Problems:   Alcohol abuse   Cocaine abuse  Review of Systems  Constitutional: Negative.   HENT: Negative.   Eyes: Negative.   Respiratory: Negative.   Cardiovascular: Negative.   Gastrointestinal: Negative.   Genitourinary: Negative.   Musculoskeletal: Negative.   Skin: Negative.   Neurological: Negative.   Endo/Heme/Allergies: Negative.   Psychiatric/Behavioral: Positive for depression (Stabilized with medication prior to discharge) and substance abuse (Alcohol dependence). Negative for suicidal ideas, hallucinations and memory loss. The patient is nervous/anxious (Stabilized with medication prior to discharge) and has insomnia (Stabilized with medication prior to discharge).    Axis Diagnosis:   AXIS I:  Alcohol depndence, Major depressive disorder, recurrent episode, moderate AXIS II:  Deferred AXIS III:   Past Medical History  Diagnosis Date  . Obesity   . Hypertension   . Polysubstance abuse   . Bipolar 1 disorder   . Schizophrenia   . Depression   . Anxiety    AXIS IV:  Alcoholism AXIS V:  63  Level of Care:  OP  Hospital Course: 37 Y/O female who was here in May. She was let go of her job as the information of her being out sick did not get to the computeer. She was not able to keep her apartment. Went to stay with her mother. States this is a negative environment for her. States her mother is a "b----." She went off her night medications. She got increasingly more depressed. She got her job back three weeks ago. Yesterday morning had a "brake down," after her mother was on her case because she came home late.  She got to feel suicidal. She told her mother and she told her "go ahead." She states she knew she needed help.  Linda Sheppard stay in this hospital was rather very brief. Although with toxicology reports showing blood alcohol level of 64, (+) cocaine and THC on the UDS reports, patient was not showing and or having any withdrawal symptoms upon arrival and or throughout her brief hospital stay . As a result, she did not receive any detoxification treatment protocol. However, she did require mood stabilization and crisis management to bring her back to her most possible stable state. She was then ordered and received, Elavil 50 mg Q bedtime for sleep/depression, Abilify 15 mg Q bedtime for mood control, Cymbalta 60 mg daily for depression and Neurontin 200 mg three time daily for anxiety. She also received medication management and monitoring for her other medical issues and concerns. She tolerated her treatment regimen without any significant adverse effects and or reactions reported.  Linda Sheppard did respond to her treatment regimen. This is evidenced by her reports of improved mood and reduction of depressive symptoms. She is currently being discharged to follow-up care at the [facility] [place]. The address and contact information for the caring services provided for patient in writing. Upon discharge, patient adamantly denies any suicidal, homicidal ideations, auditory, visual hallucinations, delusional thoughts, paranoia and or withdrawal symptoms. She was provided 14 days worth supply samples of her [facility] discharge medications. She left [facility] with all personal belongings in no apparent distress. Consults:  psychiatry  Significant Diagnostic Studies:  labs: BC with diff, CMP, UDS, Toxicology tests, U/A  Discharge Vitals:   Blood pressure 140/82, pulse 98, temperature 98.6 F (37 C), temperature source Oral, resp. rate 16, height 5' 4.96" (1.65 m), weight 145.151 kg (320 lb), last menstrual  period 09/24/2012. Body mass index is 53.32 kg/(m^2). Lab Results:   Results for orders placed during the hospital encounter of 10/04/12 (from the past 72 hour(s))  URINE RAPID DRUG SCREEN (HOSP PERFORMED)     Status: Abnormal   Collection Time    10/04/12 12:38 PM      Result Value Range   Opiates NONE DETECTED  NONE DETECTED   Cocaine POSITIVE (*) NONE DETECTED   Benzodiazepines NONE DETECTED  NONE DETECTED   Amphetamines NONE DETECTED  NONE DETECTED   Tetrahydrocannabinol POSITIVE (*) NONE DETECTED   Barbiturates NONE DETECTED  NONE DETECTED   Comment:            DRUG SCREEN FOR MEDICAL PURPOSES     ONLY.  IF CONFIRMATION IS NEEDED     FOR ANY PURPOSE, NOTIFY LAB     WITHIN 5 DAYS.                LOWEST DETECTABLE LIMITS     FOR URINE DRUG SCREEN     Drug Class       Cutoff (ng/mL)     Amphetamine      1000     Barbiturate      200     Benzodiazepine   200     Tricyclics       300     Opiates          300     Cocaine          300     THC              50  URINALYSIS, ROUTINE W REFLEX MICROSCOPIC     Status: Abnormal   Collection Time    10/04/12 12:38 PM      Result Value Range   Color, Urine YELLOW  YELLOW   APPearance HAZY (*) CLEAR   Specific Gravity, Urine 1.015  1.005 - 1.030   pH 5.5  5.0 - 8.0   Glucose, UA NEGATIVE  NEGATIVE mg/dL   Hgb urine dipstick NEGATIVE  NEGATIVE   Bilirubin Urine NEGATIVE  NEGATIVE   Ketones, ur NEGATIVE  NEGATIVE mg/dL   Protein, ur NEGATIVE  NEGATIVE mg/dL   Urobilinogen, UA 0.2  0.0 - 1.0 mg/dL   Nitrite NEGATIVE  NEGATIVE   Leukocytes, UA NEGATIVE  NEGATIVE   Comment: MICROSCOPIC NOT DONE ON URINES WITH NEGATIVE PROTEIN, BLOOD, LEUKOCYTES, NITRITE, OR GLUCOSE <1000 mg/dL.  POCT PREGNANCY, URINE     Status: None   Collection Time    10/04/12 12:49 PM      Result Value Range   Preg Test, Ur NEGATIVE  NEGATIVE   Comment:            THE SENSITIVITY OF THIS     METHODOLOGY IS >24 mIU/mL  CBC WITH DIFFERENTIAL     Status:  Abnormal   Collection Time    10/04/12 12:50 PM      Result Value Range   WBC 8.8  4.0 - 10.5 K/uL   RBC 4.66  3.87 - 5.11 MIL/uL   Hemoglobin 12.7  12.0 - 15.0 g/dL   HCT 16.1  09.6 - 04.5 %   MCV 77.9 (*)  78.0 - 100.0 fL   MCH 27.3  26.0 - 34.0 pg   MCHC 35.0  30.0 - 36.0 g/dL   RDW 13.0  86.5 - 78.4 %   Platelets 253  150 - 400 K/uL   Neutrophils Relative % 59  43 - 77 %   Neutro Abs 5.2  1.7 - 7.7 K/uL   Lymphocytes Relative 33  12 - 46 %   Lymphs Abs 2.9  0.7 - 4.0 K/uL   Monocytes Relative 8  3 - 12 %   Monocytes Absolute 0.7  0.1 - 1.0 K/uL   Eosinophils Relative 1  0 - 5 %   Eosinophils Absolute 0.0  0.0 - 0.7 K/uL   Basophils Relative 0  0 - 1 %   Basophils Absolute 0.0  0.0 - 0.1 K/uL  BASIC METABOLIC PANEL     Status: Abnormal   Collection Time    10/04/12 12:50 PM      Result Value Range   Sodium 139  135 - 145 mEq/L   Potassium 3.7  3.5 - 5.1 mEq/L   Chloride 102  96 - 112 mEq/L   CO2 23  19 - 32 mEq/L   Glucose, Bld 76  70 - 99 mg/dL   BUN 9  6 - 23 mg/dL   Creatinine, Ser 6.96  0.50 - 1.10 mg/dL   Calcium 9.0  8.4 - 29.5 mg/dL   GFR calc non Af Amer 76 (*) >90 mL/min   GFR calc Af Amer 88 (*) >90 mL/min   Comment:            The eGFR has been calculated     using the CKD EPI equation.     This calculation has not been     validated in all clinical     situations.     eGFR's persistently     <90 mL/min signify     possible Chronic Kidney Disease.  ETHANOL     Status: Abnormal   Collection Time    10/04/12 12:50 PM      Result Value Range   Alcohol, Ethyl (B) 64 (*) 0 - 11 mg/dL   Comment:            LOWEST DETECTABLE LIMIT FOR     SERUM ALCOHOL IS 11 mg/dL     FOR MEDICAL PURPOSES ONLY    Physical Findings: AIMS:  , ,  ,  ,    CIWA:  CIWA-Ar Total: 1 COWS:     Psychiatric Specialty Exam: See Psychiatric Specialty Exam and Suicide Risk Assessment completed by Attending Physician prior to discharge.  Discharge destination:  Home  Is  patient on multiple antipsychotic therapies at discharge:  No   Has Patient had three or more failed trials of antipsychotic monotherapy by history:  No  Recommended Plan for Multiple Antipsychotic Therapies: NA     Medication List    STOP taking these medications       hydrOXYzine 50 MG tablet  Commonly known as:  ATARAX/VISTARIL      TAKE these medications     Indication   amitriptyline 50 MG tablet  Commonly known as:  ELAVIL  Take 1 tablet (50 mg total) by mouth at bedtime. For depression/sleep   Indication:  Depression, Trouble Sleeping     ARIPiprazole 15 MG tablet  Commonly known as:  ABILIFY  Take 1 tablet (15 mg total) by mouth at bedtime. For mood control   Indication:  Major  Depressive Disorder, Mood control     benzocaine 10 % mucosal gel  Commonly known as:  ORAJEL  Use as directed in the mouth or throat 3 (three) times daily as needed for pain. For tooth pain   Indication:  Tooth pain     DULoxetine 60 MG capsule  Commonly known as:  CYMBALTA  Take 1 capsule (60 mg total) by mouth daily. For depression   Indication:  Generalized Anxiety Disorder, Major Depressive Disorder     gabapentin 100 MG capsule  Commonly known as:  NEURONTIN  Take 2 capsules (200 mg ) three time daily and 3 capsules (300 mg) at bedtime: For anxiety/pain management   Indication:  Pain, Anxiety     hydrochlorothiazide 25 MG tablet  Commonly known as:  HYDRODIURIL  Take 1 tablet (25 mg total) by mouth daily. For hypertension   Indication:  High Blood Pressure       Follow-up Information   [facility] with [facility] [place] ([facility] will call you with appt time for hospital followup/medication management and therapy with Roxine Caddy. If they do not call you by Friday, call to schedule appt. [phone] ext. [phone])    Contact information:   [address], [place] [address] phone: [phone] fax: [phone]    Follow-up recommendations: Activity:  As  tolerated Diet: As recommended by your primary care doctor. Keep all scheduled follow-up appointments as recommended. Continue to work on your relapse prevention plan Comments:  Take all your medications as prescribed by your mental healthcare provider. Report any adverse effects and or reactions from your medicines to your outpatient provider promptly. Patient is instructed and cautioned to not engage in alcohol and or illegal drug use while on prescription medicines. In the event of worsening symptoms, patient is instructed to call the crisis hotline, 911 and or go to the nearest ED for appropriate evaluation and treatment of symptoms. Follow-up with your primary care provider for your other medical issues, concerns and or health care needs.   Total Discharge Time:  Greater than 30 minutes.  Signed: Sanjuana Kava, PMHNP-BC 10/06/2012, 12:05 PM Agree with assessment and plan Reymundo Poll.Otis Burress,M.D.

## 2012-10-06 NOTE — Progress Notes (Signed)
[facility] Adult Case Management Discharge Plan :  Will you be returning to the same living situation after discharge: Yes,  home with parent(s) At discharge, do you have transportation home?:Yes,  mother Do you have the ability to pay for your medications:Yes,  mental health  Release of information consent forms completed and in the chart;  Patient's signature needed at discharge.  Patient to Follow up at: Follow-up Information   Follow up with [facility] [place]. ([facility] will call you with appt time for hospital followup/medication management and therapy with Roxine Caddy. If they do not call you by Friday, call to schedule appt. [phone] ext. [phone])    Contact information:   [address], [place] [address] phone: [phone] fax: [phone]      Patient denies SI/HI:   Yes,  in group/self report    Safety Planning and Suicide Prevention discussed:  Yes,  contact attempts made and voicemail left for pt's father, Inge Waldroup. SPE completed with pt. SPI pamphlet given to pt and she was encouraged to ask questions/voice concerns.   Smart, Jaquasha Carnevale 10/06/2012, 11:10 AM

## 2012-10-06 NOTE — [facility] Suicide Risk Assessment (Signed)
Suicide Risk Assessment  Discharge Assessment     Demographic Factors:  NA  Mental Status Per Nursing Assessment::   On Admission:  NA  Current Mental Status by Physician: In full contact with reality. There are no suicidal ideas, plans or intent. Her mood is euthymic, her affect is appropriate. States she feels ready to be discharged home. She is going back to work on Saturday. Does not want to do anything to jeopardize her job. She states she talked to her mother and that she is going to be more mindful of how she treats her. She is planning to safe money and get her own place again   Loss Factors: Financial problems/change in socioeconomic status  Historical Factors: NA  Risk Reduction Factors:   Sense of responsibility to family, Employed and Living with another person, especially a relative  Continued Clinical Symptoms:  Depression:   Comorbid alcohol abuse/dependence  Cognitive Features That Contribute To Risk:  Polarized thinking Thought constriction (tunnel vision)    Suicide Risk:  Minimal: No identifiable suicidal ideation.  Patients presenting with no risk factors but with morbid ruminations; may be classified as minimal risk based on the severity of the depressive symptoms  Discharge Diagnoses:   AXIS I:  Major Depression recurrent, Episodic Unspecified Mood Disorder, Alcohol Abuse AXIS II:  Deferred AXIS III:   Past Medical History  Diagnosis Date  . Obesity   . Hypertension   . Polysubstance abuse   . Bipolar 1 disorder   . Schizophrenia   . Depression   . Anxiety    AXIS IV:  housing problems and problems with primary support group AXIS V:  61-70 mild symptoms  Plan Of Care/Follow-up recommendations:  Activity:  as tolerated Diet:  regular Follow up [facility] Is patient on multiple antipsychotic therapies at discharge:  No   Has Patient had three or more failed trials of antipsychotic monotherapy by history:  No  Recommended Plan for  Multiple Antipsychotic Therapies:N/A   Linda Sheppard A 10/06/2012, 10:09 AM

## 2012-10-06 NOTE — [facility] Suicide Risk Assessment (Signed)
[facility] INPATIENT:  Family/Significant Other Suicide Prevention Education  Suicide Prevention Education:  Contact Attempts: Cyril Railey (pt's father) has been identified by the patient as the family member/significant other with whom the patient will be residing, and identified as the person(s) who will aid the patient in the event of a mental health crisis.  With written consent from the patient, two attempts were made to provide suicide prevention education, prior to and/or following the patient's discharge.  We were unsuccessful in providing suicide prevention education.  A suicide education pamphlet was given to the patient to share with family/significant other.  Date and time of first attempt: 10/06/12 9:45AM Date and time of second attempt:10/06/12 11:00AM Voicemail left.  Smart, Necola Bluestein 10/06/2012, 11:10 AM

## 2012-10-11 NOTE — Progress Notes (Signed)
Patient Discharge Instructions:  After Visit Summary (AVS):   Faxed to:  10/11/12 Discharge Summary Note:   Faxed to:  10/11/12 Psychiatric Admission Assessment Note:   Faxed to:  10/11/12 Suicide Risk Assessment - Discharge Assessment:   Faxed to:  10/11/12 Faxed/Sent to the Next Level Care provider:  10/11/12 Faxed to [facility] @ [phone]  Jerelene Redden, 10/11/2012, 3:48 PM

## 2013-04-20 ENCOUNTER — Emergency Department (HOSPITAL_COMMUNITY)
Admission: EM | Admit: 2013-04-20 | Discharge: 2013-04-20 | Disposition: A | Discharge status: Home / Self Care | Payer: Medicaid Other | Attending: Emergency Medicine | Admitting: Emergency Medicine

## 2013-04-20 ENCOUNTER — Emergency Department (HOSPITAL_COMMUNITY): Payer: Medicaid Other

## 2013-04-20 ENCOUNTER — Encounter (HOSPITAL_COMMUNITY): Payer: Self-pay | Admitting: Emergency Medicine

## 2013-04-20 DIAGNOSIS — I10 Essential (primary) hypertension: Secondary | ICD-10-CM | POA: Insufficient documentation

## 2013-04-20 DIAGNOSIS — M25579 Pain in unspecified ankle and joints of unspecified foot: Secondary | ICD-10-CM | POA: Insufficient documentation

## 2013-04-20 DIAGNOSIS — E669 Obesity, unspecified: Secondary | ICD-10-CM | POA: Insufficient documentation

## 2013-04-20 DIAGNOSIS — F17200 Nicotine dependence, unspecified, uncomplicated: Secondary | ICD-10-CM | POA: Insufficient documentation

## 2013-04-20 DIAGNOSIS — Z8659 Personal history of other mental and behavioral disorders: Secondary | ICD-10-CM | POA: Insufficient documentation

## 2013-04-20 DIAGNOSIS — M25569 Pain in unspecified knee: Secondary | ICD-10-CM | POA: Insufficient documentation

## 2013-04-20 DIAGNOSIS — M25572 Pain in left ankle and joints of left foot: Secondary | ICD-10-CM

## 2013-04-20 DIAGNOSIS — M25469 Effusion, unspecified knee: Secondary | ICD-10-CM | POA: Insufficient documentation

## 2013-04-20 DIAGNOSIS — Z79899 Other long term (current) drug therapy: Secondary | ICD-10-CM | POA: Insufficient documentation

## 2013-04-20 DIAGNOSIS — M25561 Pain in right knee: Secondary | ICD-10-CM

## 2013-04-20 MED ORDER — NAPROXEN 500 MG PO TABS: 500.0000 mg | ORAL_TABLET | Freq: Two times a day (BID) | ORAL | Status: DC

## 2013-04-20 MED ORDER — IBUPROFEN 400 MG PO TABS
600.0000 mg | ORAL_TABLET | Freq: Once | ORAL | Status: AC
  Administered 2013-04-20: 600 mg via ORAL
  Filled 2013-04-20 (×2): qty 1

## 2013-04-20 NOTE — Discharge Instructions (Signed)
Take the prescribed medication as directed. Rest, ice, and elevate knee to help with pain and swelling. Follow-up with orthopedics if no improvement within the next week. Return to the ED for new or worsening symptoms.

## 2013-04-20 NOTE — ED Notes (Signed)
Has been in bed for 2 days for rt knee and left ankle pain no new injury states just swelled cannot bend rt knee  Left ankle a little swollen

## 2013-04-20 NOTE — ED Notes (Signed)
Denies injury to knee or ankles. Having pain to right knee and "clicking" to right ankle, difficulty and pain with ambulating.

## 2013-04-20 NOTE — ED Provider Notes (Signed)
CSN: [id number]     Arrival date & time 04/20/13  1034 History  This chart was scribed for Linda Carnes, PA working with Virgel Manifold, MD by Roxan Diesel, ED Scribe. This patient was seen in room TR05C/TR05C and the patient's care was started at 11:27 AM.   Chief Complaint  Patient presents with  . Ankle Pain  . Knee Pain    The history is provided by the patient. No language interpreter was used.    HPI Comments: Linda Sheppard is a 38 y.o. female who presents to the Emergency Department complaining of several days of right knee pain.  Pt states that she has had some mild pain on-and-off to both knees for some time.  Several days ago she noticed some swelling in the right knee.  She continued to walk on the knee and since then has had worsened pain to that knee.  Pain is worsened by bending the knee.  She also complains of a clicking and "rubbing" sensation in the knee. She denies injury to the knee.  Pt also complains of Intermittent left ankle pain which has been ongoing for some time.  She states she can feel the ankle clicking and grinding when she moves it.  No prior injuries or surgeries to ankle or knee.  Denies numbness or paresthesias of LE.  She has attempted to treat pain with ibuprofen, with minimal relief.   Past Medical History  Diagnosis Date  . Obesity   . Hypertension   . Polysubstance abuse   . Bipolar 1 disorder   . Schizophrenia   . Depression   . Anxiety     Past Surgical History  Procedure Laterality Date  . Tubal ligation    . Cesarean section      History reviewed. No pertinent family history.   History  Substance Use Topics  . Smoking status: Current Every Day Smoker -- 0.15 packs/day    Types: Cigarettes  . Smokeless tobacco: Not on file  . Alcohol Use: Yes     Comment: 48 oz beer/sporadic    OB History   Grav Para Term Preterm Abortions TAB SAB Ect Mult Living                   Review of Systems  Musculoskeletal: Positive for  arthralgias (right knee and left ankle) and joint swelling (right knee).  All other systems reviewed and are negative.    Allergies  Tramadol and Ceftin  Home Medications   Current Outpatient Rx  Name  Route  Sig  Dispense  Refill  . hydrochlorothiazide (HYDRODIURIL) 25 MG tablet   Oral   Take 1 tablet (25 mg total) by mouth daily. For hypertension         . ibuprofen (ADVIL,MOTRIN) 200 MG tablet   Oral   Take 800 mg by mouth every 6 (six) hours as needed for moderate pain.          BP 164/118  Pulse 73  Temp(Src) 97.6 F (36.4 C) (Oral)  Resp 18  SpO2 96%  LMP 03/26/2013  Physical Exam  Nursing note and vitals reviewed. Constitutional: She is oriented to person, place, and time. She appears well-developed and well-nourished. No distress.  HENT:  Head: Normocephalic and atraumatic.  Mouth/Throat: Oropharynx is clear and moist.  Eyes: Conjunctivae and EOM are normal. Pupils are equal, round, and reactive to light.  Neck: Normal range of motion. Neck supple.  Cardiovascular: Normal rate, regular rhythm and normal heart sounds.  Pulmonary/Chest: Effort normal and breath sounds normal. No respiratory distress. She has no wheezes.  Abdominal: Soft.  Musculoskeletal:       Right knee: She exhibits decreased range of motion. She exhibits no swelling, no effusion, no ecchymosis, no deformity, no laceration and no erythema. Tenderness found.       Left ankle: Normal.       Legs: Tender over right patella.  No crepitus noted.  Difficult to assess due to body habitus. Left ankle WNL  Neurological: She is alert and oriented to person, place, and time.  Skin: Skin is warm and dry. She is not diaphoretic.  Psychiatric: She has a normal mood and affect.    ED Course  Procedures (including critical care time)  DIAGNOSTIC STUDIES: Oxygen Saturation is 96% on room air, normal by my interpretation.    COORDINATION OF CARE: 11:31 AM-Discussed treatment plan which includes  imaging with pt at bedside and pt agreed to plan.  Labs Review Labs Reviewed - No data to display  Imaging Review Dg Ankle Complete Left  04/20/2013   CLINICAL DATA:  Left ankle pain without injury.  EXAM: LEFT ANKLE COMPLETE - 3+ VIEW  COMPARISON:  Jul 10, 2006.  FINDINGS: There is no evidence of fracture, dislocation, or joint effusion. There is no evidence of arthropathy. Talar dome appears intact. Small spur of posterior calcaneus is noted. Soft tissues are unremarkable.  IMPRESSION: No significant abnormality seen in the left ankle.   Electronically Signed   By: Sabino Dick M.D.   On: 04/20/2013 12:20   Dg Knee Complete 4 Views Right  04/20/2013   CLINICAL DATA:  Pain, no known injury  EXAM: RIGHT KNEE - COMPLETE 4+ VIEW  COMPARISON:  04/07/2012  FINDINGS: Four views of the right knee submitted. No acute fracture or subluxation. Small joint effusion. Mild narrowing of medial scattered minimal narrowing of medial joint compartment.  IMPRESSION: No acute fracture or subluxation. Small joint effusion. Minimal degenerative changes.   Electronically Signed   By: Lahoma Crocker M.D.   On: 04/20/2013 12:23    EKG Interpretation   None       MDM   Final diagnoses:  Knee pain, right  Ankle pain, left   X-rays revealing small effusion of right knee, left ankle WNL.  Right knee was wrapped with ACE bandage by orthopedics.  Rx naprosyn.  May ice and elevate knee to help with pain and swelling.  Advised her to FU with orthopedics if no improvement in the next week.  Discussed plan with pt, she acknowledged understanding and agreed with plan of care.  I personally performed the services described in this documentation, which was scribed in my presence. The recorded information has been reviewed and is accurate.   Larene Pickett, PA-C 04/20/13 1510

## 2013-04-21 ENCOUNTER — Encounter (HOSPITAL_COMMUNITY): Payer: Self-pay | Admitting: Emergency Medicine

## 2013-04-21 ENCOUNTER — Emergency Department (HOSPITAL_COMMUNITY)
Admission: EM | Admit: 2013-04-21 | Discharge: 2013-04-21 | Disposition: A | Discharge status: Home / Self Care | Payer: Medicaid Other | Attending: Emergency Medicine | Admitting: Emergency Medicine

## 2013-04-21 DIAGNOSIS — Z791 Long term (current) use of non-steroidal anti-inflammatories (NSAID): Secondary | ICD-10-CM | POA: Insufficient documentation

## 2013-04-21 DIAGNOSIS — Z8659 Personal history of other mental and behavioral disorders: Secondary | ICD-10-CM | POA: Insufficient documentation

## 2013-04-21 DIAGNOSIS — M1710 Unilateral primary osteoarthritis, unspecified knee: Secondary | ICD-10-CM | POA: Insufficient documentation

## 2013-04-21 DIAGNOSIS — F17200 Nicotine dependence, unspecified, uncomplicated: Secondary | ICD-10-CM | POA: Insufficient documentation

## 2013-04-21 DIAGNOSIS — I10 Essential (primary) hypertension: Secondary | ICD-10-CM | POA: Insufficient documentation

## 2013-04-21 DIAGNOSIS — M25561 Pain in right knee: Secondary | ICD-10-CM

## 2013-04-21 DIAGNOSIS — Z79899 Other long term (current) drug therapy: Secondary | ICD-10-CM | POA: Insufficient documentation

## 2013-04-21 DIAGNOSIS — IMO0002 Reserved for concepts with insufficient information to code with codable children: Principal | ICD-10-CM

## 2013-04-21 MED ORDER — DICLOFENAC SODIUM 1 % TD GEL: 4.0000 g | Freq: Four times a day (QID) | TRANSDERMAL | Status: DC

## 2013-04-21 MED ORDER — MELOXICAM 15 MG PO TABS: 15.0000 mg | ORAL_TABLET | Freq: Every day | ORAL | Status: DC

## 2013-04-21 MED ORDER — ACETAMINOPHEN-CODEINE #3 300-30 MG PO TABS
1.0000 | ORAL_TABLET | Freq: Four times a day (QID) | ORAL | Status: DC | PRN
Start: 2013-04-21 — End: 2013-05-02

## 2013-04-21 NOTE — Discharge Instructions (Signed)
Stay off of your legs for several days. Keep it elevated at home, ice several times a day. Take Mobic for pain. Tylenol #3 for sever pain. Apply Voltaren she topically for extra pain relief. Please call the orthopedic Dr. followup with them in the office.  Knee Pain Knee pain can be a result of an injury or other medical conditions. Treatment will depend on the cause of your pain. HOME CARE  Only take medicine as told by your doctor.  Keep a healthy weight. Being overweight can make the knee hurt more.  Stretch before exercising or playing sports.  If there is constant knee pain, change the way you exercise. Ask your doctor for advice.  Make sure shoes fit well. Choose the right shoe for the sport or activity.  Protect your knees. Wear kneepads if needed.  Rest when you are tired. GET HELP RIGHT AWAY IF:   Your knee pain does not stop.  Your knee pain does not get better.  Your knee joint feels hot to the touch.  You have a fever. MAKE SURE YOU:   Understand these instructions.  Will watch this condition.  Will get help right away if you are not doing well or get worse. Document Released: 05/22/2008 Document Revised: 05/18/2011 Document Reviewed: 05/22/2008 [facility] Patient Information 2014 [place], [place].

## 2013-04-21 NOTE — ED Provider Notes (Signed)
CSN: [id number]     Arrival date & time 04/21/13  1021 History  This chart was scribed for non-physician practitionerTatyana Marc Morgans, PA-C working with Alfonzo Feller, DO by Zettie Pho, ED Scribe. This patient was seen in room TR09C/TR09C and the patient's care was started at 12:12 PM.    Chief Complaint  Patient presents with  . Knee Pain   The history is provided by the patient. No language interpreter was used.   HPI Comments: Linda Sheppard is a 38 y.o. female with a history of obesity and polysubstance abuse who presents to the Emergency Department complaining of a constant pain to the right knee onset about a week ago that has been progressively worsening over the past 3 days. She states the pain is exacerbated with movement, flexion, and walking/bearing weight. Patient was seen here yesterday for similar complaints and received x-rays that were negative for acute injury. Patient was diagnosed with arthritis and discharged with Naproxen. She reports taking the naproxen and ibuprofen at home without significant relief. Patient denies any changes in her symptoms, but is requesting stronger medication to manage her pain. She states that she has not been able to follow up with a PCP or orthopedist for these complaints. Patient reports an allergy to Tramadol. Patient also has a history of HTN, bipolar 1 disorder, schizophrenia, depression, and anxiety.  Past Medical History  Diagnosis Date  . Obesity   . Hypertension   . Polysubstance abuse   . Bipolar 1 disorder   . Schizophrenia   . Depression   . Anxiety    Past Surgical History  Procedure Laterality Date  . Tubal ligation    . Cesarean section     History reviewed. No pertinent family history. History  Substance Use Topics  . Smoking status: Current Every Day Smoker -- 0.15 packs/day    Types: Cigarettes  . Smokeless tobacco: Not on file  . Alcohol Use: Yes     Comment: 48 oz beer/sporadic   OB History   Grav Para  Term Preterm Abortions TAB SAB Ect Mult Living                 Review of Systems  Musculoskeletal: Positive for arthralgias (R knee).   Allergies  Tramadol and Ceftin  Home Medications   Current Outpatient Rx  Name  Route  Sig  Dispense  Refill  . hydrochlorothiazide (HYDRODIURIL) 25 MG tablet   Oral   Take 1 tablet (25 mg total) by mouth daily. For hypertension         . ibuprofen (ADVIL,MOTRIN) 200 MG tablet   Oral   Take 800 mg by mouth every 6 (six) hours as needed for moderate pain.         . naproxen (NAPROSYN) 500 MG tablet   Oral   Take 1 tablet (500 mg total) by mouth 2 (two) times daily with a meal.   30 tablet   0    Triage Vitals: BP 150/101  Pulse 78  Temp(Src) 98.6 F (37 C) (Oral)  Resp 22  Ht 5' 6.5" (1.689 m)  Wt 320 lb (145.151 kg)  BMI 50.88 kg/m2  SpO2 98%  LMP 03/26/2013  Physical Exam  Nursing note and vitals reviewed. Constitutional: She is oriented to person, place, and time. She appears well-developed and well-nourished. No distress.  HENT:  Head: Normocephalic and atraumatic.  Eyes: Conjunctivae are normal.  Neck: Normal range of motion. Neck supple.  Pulmonary/Chest: Effort normal. No respiratory distress.  Abdominal: She exhibits no distension.  Musculoskeletal: Normal range of motion. She exhibits tenderness.       Right knee: She exhibits no swelling and no deformity. Tenderness found. Patellar tendon tenderness noted.  Tenderness to palpation over anterior right patella/patellar tendon. Unable to actively flex the knee. Negative anterior and posterior drawer test. No erythema, obvious swelling, or warmth.   Neurological: She is alert and oriented to person, place, and time.  Skin: Skin is warm and dry.  Psychiatric: She has a normal mood and affect. Her behavior is normal.    ED Course  Procedures (including critical care time)  DIAGNOSTIC STUDIES: Oxygen Saturation is 98% on room air, normal by my interpretation.     COORDINATION OF CARE: 12:17 PM- Advised patient to rest the area. Will discharge patient with crutches, Mobic, and Voltaren to manage symptoms. Advised patient to follow up with the referred orthopedist. Encouraged weight loss to help with her symptoms as well. Discussed treatment plan with patient at bedside and patient verbalized agreement.     Labs Review Labs Reviewed - No data to display  Imaging Review Dg Ankle Complete Left  04/20/2013   CLINICAL DATA:  Left ankle pain without injury.  EXAM: LEFT ANKLE COMPLETE - 3+ VIEW  COMPARISON:  Jul 10, 2006.  FINDINGS: There is no evidence of fracture, dislocation, or joint effusion. There is no evidence of arthropathy. Talar dome appears intact. Small spur of posterior calcaneus is noted. Soft tissues are unremarkable.  IMPRESSION: No significant abnormality seen in the left ankle.   Electronically Signed   By: Sabino Dick M.D.   On: 04/20/2013 12:20   Dg Knee Complete 4 Views Right  04/20/2013   CLINICAL DATA:  Pain, no known injury  EXAM: RIGHT KNEE - COMPLETE 4+ VIEW  COMPARISON:  04/07/2012  FINDINGS: Four views of the right knee submitted. No acute fracture or subluxation. Small joint effusion. Mild narrowing of medial scattered minimal narrowing of medial joint compartment.  IMPRESSION: No acute fracture or subluxation. Small joint effusion. Minimal degenerative changes.   Electronically Signed   By: Lahoma Crocker M.D.   On: 04/20/2013 12:23    EKG Interpretation   None       MDM   Final diagnoses:  Knee pain, right     Patient with right knee pain. She is morbidly obese, looking back in her records she was seen for the same pain and her knee a year ago. Patient states she never followed up. She was also seen yesterday and was discharged with Naprosyn after her x-ray showed small joint effusion and minimal degenerative changes. My exam her pain is mainly over her patella tendon, there is no signs of infection in the joint. Patient  unable to move knee actively do to pain. I will treat her with Mobic,) gel, and Tylenol with codeine given 15 tablets. She is to follow with orthopedics Dr. I have given her referral.  Filed Vitals:   04/21/13 1058  BP: 150/101  Pulse: 78  Temp: 98.6 F (37 C)  TempSrc: Oral  Resp: 22  Height: 5' 6.5" (1.689 m)  Weight: 320 lb (145.151 kg)  SpO2: 98%    I personally performed the services described in this documentation, which was scribed in my presence. The recorded information has been reviewed and is accurate.     Renold Genta, PA-C 04/21/13 1259

## 2013-04-21 NOTE — ED Notes (Signed)
Pt reports constant Right knee pain and Left ankle pain starting Friday 6th. Pt was seen here yesterday for the same and was given a diagnosis of arthritis. Pt states she is unable to ambulate with out assistance, apply any pressure, and a clicking noise when bending her leg. Pt states she also has fluid to her knee. Pt was prescribed Naproxen and is requesting something stronger for pain and a knee brace.

## 2013-04-22 NOTE — ED Provider Notes (Signed)
Medical screening examination/treatment/procedure(s) were performed by non-physician practitioner and as supervising physician I was immediately available for consultation/collaboration.  EKG Interpretation   None         Alfonzo Feller, DO 04/22/13 1351

## 2013-04-24 NOTE — ED Provider Notes (Signed)
Medical screening examination/treatment/procedure(s) were performed by non-physician practitioner and as supervising physician I was immediately available for consultation/collaboration.  EKG Interpretation   None        Huntley Knoop, MD 04/24/13 1414 

## 2013-05-02 ENCOUNTER — Emergency Department (HOSPITAL_COMMUNITY)
Admission: EM | Admit: 2013-05-02 | Discharge: 2013-05-02 | Disposition: A | Discharge status: Home / Self Care | Payer: No Typology Code available for payment source | Attending: Emergency Medicine | Admitting: Emergency Medicine

## 2013-05-02 ENCOUNTER — Encounter (HOSPITAL_COMMUNITY): Payer: Self-pay | Admitting: Emergency Medicine

## 2013-05-02 DIAGNOSIS — I10 Essential (primary) hypertension: Secondary | ICD-10-CM | POA: Insufficient documentation

## 2013-05-02 DIAGNOSIS — S0990XA Unspecified injury of head, initial encounter: Secondary | ICD-10-CM | POA: Insufficient documentation

## 2013-05-02 DIAGNOSIS — Z791 Long term (current) use of non-steroidal anti-inflammatories (NSAID): Secondary | ICD-10-CM | POA: Insufficient documentation

## 2013-05-02 DIAGNOSIS — Z8659 Personal history of other mental and behavioral disorders: Secondary | ICD-10-CM | POA: Insufficient documentation

## 2013-05-02 DIAGNOSIS — Z79899 Other long term (current) drug therapy: Secondary | ICD-10-CM | POA: Insufficient documentation

## 2013-05-02 DIAGNOSIS — IMO0002 Reserved for concepts with insufficient information to code with codable children: Secondary | ICD-10-CM | POA: Insufficient documentation

## 2013-05-02 DIAGNOSIS — M549 Dorsalgia, unspecified: Secondary | ICD-10-CM

## 2013-05-02 DIAGNOSIS — F1410 Cocaine abuse, uncomplicated: Secondary | ICD-10-CM | POA: Insufficient documentation

## 2013-05-02 DIAGNOSIS — Y9389 Activity, other specified: Secondary | ICD-10-CM | POA: Insufficient documentation

## 2013-05-02 DIAGNOSIS — F17200 Nicotine dependence, unspecified, uncomplicated: Secondary | ICD-10-CM | POA: Insufficient documentation

## 2013-05-02 DIAGNOSIS — Y92410 Unspecified street and highway as the place of occurrence of the external cause: Secondary | ICD-10-CM | POA: Insufficient documentation

## 2013-05-02 MED ORDER — ACETAMINOPHEN-CODEINE #3 300-30 MG PO TABS: 1.0000 | ORAL_TABLET | Freq: Four times a day (QID) | ORAL | Status: DC | PRN
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
## 2013-05-02 NOTE — ED Notes (Signed)
Pt name called, no answer x1
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
## 2013-05-03 NOTE — ED Provider Notes (Signed)
Medical screening examination/treatment/procedure(s) were performed by non-physician practitioner and as supervising physician I was immediately available for consultation/collaboration.  EKG Interpretation   None        Merryl Hacker, MD 05/03/13 [phone]

## 2013-06-11 ENCOUNTER — Encounter (HOSPITAL_COMMUNITY): Payer: Self-pay | Admitting: Emergency Medicine

## 2013-06-11 ENCOUNTER — Emergency Department (HOSPITAL_COMMUNITY): Payer: Medicaid Other

## 2013-06-11 ENCOUNTER — Emergency Department (HOSPITAL_COMMUNITY)
Admission: EM | Admit: 2013-06-11 | Discharge: 2013-06-11 | Disposition: A | Discharge status: Home / Self Care | Payer: Medicaid Other | Attending: Emergency Medicine | Admitting: Emergency Medicine

## 2013-06-11 DIAGNOSIS — E669 Obesity, unspecified: Secondary | ICD-10-CM | POA: Insufficient documentation

## 2013-06-11 DIAGNOSIS — S01501A Unspecified open wound of lip, initial encounter: Secondary | ICD-10-CM | POA: Insufficient documentation

## 2013-06-11 DIAGNOSIS — Z8659 Personal history of other mental and behavioral disorders: Secondary | ICD-10-CM | POA: Insufficient documentation

## 2013-06-11 DIAGNOSIS — I10 Essential (primary) hypertension: Secondary | ICD-10-CM | POA: Insufficient documentation

## 2013-06-11 DIAGNOSIS — F17200 Nicotine dependence, unspecified, uncomplicated: Secondary | ICD-10-CM | POA: Insufficient documentation

## 2013-06-11 DIAGNOSIS — S01511A Laceration without foreign body of lip, initial encounter: Secondary | ICD-10-CM

## 2013-06-11 MED ORDER — BACITRACIN ZINC 500 UNIT/GM EX OINT: 1.0000 "application " | TOPICAL_OINTMENT | Freq: Two times a day (BID) | CUTANEOUS | Status: DC

## 2013-06-11 MED ORDER — OXYCODONE-ACETAMINOPHEN 5-325 MG PO TABS
1.0000 | ORAL_TABLET | Freq: Once | ORAL | Status: AC
  Administered 2013-06-11: 1 via ORAL
  Filled 2013-06-11: qty 1

## 2013-06-11 MED ORDER — OXYCODONE-ACETAMINOPHEN 5-325 MG PO TABS: 1.0000 | ORAL_TABLET | Freq: Four times a day (QID) | ORAL | Status: DC | PRN

## 2013-06-11 MED ORDER — HYDROMORPHONE HCL PF 1 MG/ML IJ SOLN
0.5000 mg | Freq: Once | INTRAMUSCULAR | Status: AC
  Administered 2013-06-11: 0.5 mg via INTRAVENOUS
  Filled 2013-06-11: qty 1

## 2013-06-11 MED ORDER — CLINDAMYCIN HCL 150 MG PO CAPS: 150.0000 mg | ORAL_CAPSULE | Freq: Four times a day (QID) | ORAL | Status: DC
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
## 2013-06-11 NOTE — ED Notes (Signed)
MD at bedside.Oral surgery MD continued at bed side.

## 2013-06-11 NOTE — Discharge Instructions (Signed)
Facial Laceration ° A facial laceration is a cut on the face. These injuries can be painful and cause bleeding. Lacerations usually heal quickly, but they need special care to reduce scarring. °DIAGNOSIS  °Your health care provider will take a medical history, ask for details about how the injury occurred, and examine the wound to determine how deep the cut is. °TREATMENT  °Some facial lacerations may not require closure. Others may not be able to be closed because of an increased risk of infection. The risk of infection and the chance for successful closure will depend on various factors, including the amount of time since the injury occurred. °The wound may be cleaned to help prevent infection. If closure is appropriate, pain medicines may be given if needed. Your health care provider will use stitches (sutures), wound glue (adhesive), or skin adhesive strips to repair the laceration. These tools bring the skin edges together to allow for faster healing and a better cosmetic outcome. If needed, you may also be given a tetanus shot. °HOME CARE INSTRUCTIONS °· Only take over-the-counter or prescription medicines as directed by your health care provider. °· Follow your health care provider's instructions for wound care. These instructions will vary depending on the technique used for closing the wound. °For Sutures: °· Keep the wound clean and dry.   °· If you were given a bandage (dressing), you should change it at least once a day. Also change the dressing if it becomes wet or dirty, or as directed by your health care provider.   °· Wash the wound with soap and water 2 times a day. Rinse the wound off with water to remove all soap. Pat the wound dry with a clean towel.   °· After cleaning, apply a thin layer of the antibiotic ointment recommended by your health care provider. This will help prevent infection and keep the dressing from sticking.   °· You may shower as usual after the first 24 hours. Do not soak the  wound in water until the sutures are removed.   °· Get your sutures removed as directed by your health care provider. With facial lacerations, sutures should usually be taken out after 4 5 days to avoid stitch marks.   °· Wait a few days after your sutures are removed before applying any makeup. °For Skin Adhesive Strips: °· Keep the wound clean and dry.   °· Do not get the skin adhesive strips wet. You may bathe carefully, using caution to keep the wound dry.   °· If the wound gets wet, pat it dry with a clean towel.   °· Skin adhesive strips will fall off on their own. You may trim the strips as the wound heals. Do not remove skin adhesive strips that are still stuck to the wound. They will fall off in time.   °For Wound Adhesive: °· You may briefly wet your wound in the shower or bath. Do not soak or scrub the wound. Do not swim. Avoid periods of heavy sweating until the skin adhesive has fallen off on its own. After showering or bathing, gently pat the wound dry with a clean towel.   °· Do not apply liquid medicine, cream medicine, ointment medicine, or makeup to your wound while the skin adhesive is in place. This may loosen the film before your wound is healed.   °· If a dressing is placed over the wound, be careful not to apply tape directly over the skin adhesive. This may cause the adhesive to be pulled off before the wound is healed.   °·   Avoid prolonged exposure to sunlight or tanning lamps while the skin adhesive is in place.  The skin adhesive will usually remain in place for 5 10 days, then naturally fall off the skin. Do not pick at the adhesive film.  After Healing: Once the wound has healed, cover the wound with sunscreen during the day for 1 full year. This can help minimize scarring. Exposure to ultraviolet light in the first year will darken the scar. It can take 1 2 years for the scar to lose its redness and to heal completely.  SEEK IMMEDIATE MEDICAL CARE IF:  You have redness, pain, or  swelling around the wound.   You see ayellowish-white fluid (pus) coming from the wound.   You have chills or a fever.  MAKE SURE YOU:  Understand these instructions.  Will watch your condition.  Will get help right away if you are not doing well or get worse. Document Released: 04/02/2004 Document Revised: 12/14/2012 Document Reviewed: 10/06/2012 [facility] Patient Information 2014 [place], [place].  Assault, General Assault includes any behavior, whether intentional or reckless, which results in bodily injury to another person and/or damage to property. Included in this would be any behavior, intentional or reckless, that by its nature would be understood (interpreted) by a reasonable person as intent to harm another person or to damage his/her property. Threats may be oral or written. They may be communicated through regular mail, computer, fax, or phone. These threats may be direct or implied. FORMS OF ASSAULT INCLUDE:  Physically assaulting a person. This includes physical threats to inflict physical harm as well as:  Slapping.  Hitting.  Poking.  Kicking.  Punching.  Pushing.  Arson.  Sabotage.  Equipment vandalism.  Damaging or destroying property.  Throwing or hitting objects.  Displaying a weapon or an object that appears to be a weapon in a threatening manner.  Carrying a firearm of any kind.  Using a weapon to harm someone.  Using greater physical size/strength to intimidate another.  Making intimidating or threatening gestures.  Bullying.  Hazing.  Intimidating, threatening, hostile, or abusive language directed toward another person.  It communicates the intention to engage in violence against that person. And it leads a reasonable person to expect that violent behavior may occur.  Stalking another person. IF IT HAPPENS AGAIN:  Immediately call for emergency help (911 in [place]  If someone poses clear and immediate danger to you, seek  legal authorities to have a protective or restraining order put in place.  Less threatening assaults can at least be reported to authorities. STEPS TO TAKE IF A SEXUAL ASSAULT HAS HAPPENED  Go to an area of safety. This may include a shelter or staying with a friend. Stay away from the area where you have been attacked. A large percentage of sexual assaults are caused by a friend, relative or associate.  If medications were given by your caregiver, take them as directed for the full length of time prescribed.  Only take over-the-counter or prescription medicines for pain, discomfort, or fever as directed by your caregiver.  If you have come in contact with a sexual disease, find out if you are to be tested again. If your caregiver is concerned about the HIV/AIDS virus, he/she may require you to have continued testing for several months.  For the protection of your privacy, test results can not be given over the phone. Make sure you receive the results of your test. If your test results are not back during your  visit, make an appointment with your caregiver to find out the results. Do not assume everything is normal if you have not heard from your caregiver or the medical facility. It is important for you to follow up on all of your test results.  File appropriate papers with authorities. This is important in all assaults, even if it has occurred in a family or by a friend. SEEK MEDICAL CARE IF:  You have new problems because of your injuries.  You have problems that may be because of the medicine you are taking, such as:  Rash.  Itching.  Swelling.  Trouble breathing.  You develop belly (abdominal) pain, feel sick to your stomach (nausea) or are vomiting.  You begin to run a temperature.  You need supportive care or referral to a rape crisis center. These are centers with trained personnel who can help you get through this ordeal. SEEK IMMEDIATE MEDICAL CARE IF:  You are afraid  of being threatened, beaten, or abused. In [place] call 911.  You receive new injuries related to abuse.  You develop severe pain in any area injured in the assault or have any change in your condition that concerns you.  You faint or lose consciousness.  You develop chest pain or shortness of breath. Document Released: 02/23/2005 Document Revised: 05/18/2011 Document Reviewed: 10/12/2007 [facility] Patient Information 2014 [place].
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
## 2013-06-11 NOTE — ED Notes (Signed)
Previous note Charting Pt Transferred to [facility] on wrong PT.

## 2013-06-15 ENCOUNTER — Emergency Department (HOSPITAL_COMMUNITY)
Admission: EM | Admit: 2013-06-15 | Discharge: 2013-06-15 | Disposition: A | Discharge status: Home / Self Care | Payer: Medicaid Other | Source: Home / Self Care | Attending: Emergency Medicine | Admitting: Emergency Medicine

## 2013-06-15 ENCOUNTER — Encounter (HOSPITAL_COMMUNITY): Payer: Self-pay | Admitting: Emergency Medicine

## 2013-06-15 DIAGNOSIS — S199XXA Unspecified injury of neck, initial encounter: Secondary | ICD-10-CM

## 2013-06-15 DIAGNOSIS — X58XXXA Exposure to other specified factors, initial encounter: Secondary | ICD-10-CM

## 2013-06-15 DIAGNOSIS — S0993XA Unspecified injury of face, initial encounter: Secondary | ICD-10-CM

## 2013-06-15 MED ORDER — CHLORHEXIDINE GLUCONATE 0.12 % MT SOLN: 15.0000 mL | Freq: Two times a day (BID) | OROMUCOSAL | Status: DC

## 2013-06-15 MED ORDER — LIDOCAINE VISCOUS 2 % MT SOLN: OROMUCOSAL | Status: DC

## 2013-06-15 MED ORDER — HYDROCODONE-ACETAMINOPHEN 5-325 MG PO TABS: 1.0000 | ORAL_TABLET | Freq: Four times a day (QID) | ORAL | Status: DC | PRN

## 2013-06-15 NOTE — ED Provider Notes (Signed)
Medical screening examination/treatment/procedure(s) were performed by non-physician practitioner and as supervising physician I was immediately available for consultation/collaboration.  Philipp Deputy, M.D.  Harden Mo, MD 06/15/13 [phone]

## 2013-06-15 NOTE — Discharge Instructions (Signed)
I have contacted your oral surgeon's office (Dr. Buelah Manis) and he will see you in his office tomorrow 06-16-2013 at 12 noon. Please do not miss appointment.
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
## 2013-06-15 NOTE — ED Notes (Signed)
Pt       Seen  Over the  Weekend   At  [facility]    For  Mouth  Injury         Seen  By er  Dr  And  The  Oral  Surgeon      She  Reports  Continued     Pain          And  A  Foul  Odor  In  Mouth            Lips  Are  Swollen

## 2013-07-30 ENCOUNTER — Encounter (HOSPITAL_COMMUNITY): Payer: Self-pay | Admitting: Emergency Medicine

## 2013-07-30 DIAGNOSIS — K006 Disturbances in tooth eruption: Secondary | ICD-10-CM | POA: Insufficient documentation

## 2013-07-30 DIAGNOSIS — E669 Obesity, unspecified: Secondary | ICD-10-CM | POA: Insufficient documentation

## 2013-07-30 DIAGNOSIS — Z8659 Personal history of other mental and behavioral disorders: Secondary | ICD-10-CM | POA: Insufficient documentation

## 2013-07-30 DIAGNOSIS — Z79899 Other long term (current) drug therapy: Secondary | ICD-10-CM | POA: Insufficient documentation

## 2013-07-30 DIAGNOSIS — I10 Essential (primary) hypertension: Secondary | ICD-10-CM | POA: Insufficient documentation

## 2013-07-30 DIAGNOSIS — F17200 Nicotine dependence, unspecified, uncomplicated: Secondary | ICD-10-CM | POA: Insufficient documentation

## 2013-07-30 DIAGNOSIS — K089 Disorder of teeth and supporting structures, unspecified: Secondary | ICD-10-CM | POA: Insufficient documentation

## 2013-07-30 NOTE — ED Notes (Signed)
C/o R lower toothache since last night.

## 2013-07-31 ENCOUNTER — Emergency Department (HOSPITAL_COMMUNITY)
Admission: EM | Admit: 2013-07-31 | Discharge: 2013-07-31 | Disposition: A | Discharge status: Home / Self Care | Payer: Medicaid Other | Attending: Emergency Medicine | Admitting: Emergency Medicine

## 2013-07-31 DIAGNOSIS — I10 Essential (primary) hypertension: Secondary | ICD-10-CM

## 2013-07-31 DIAGNOSIS — K0889 Other specified disorders of teeth and supporting structures: Secondary | ICD-10-CM

## 2013-07-31 MED ORDER — OXYCODONE-ACETAMINOPHEN 5-325 MG PO TABS
2.0000 | ORAL_TABLET | Freq: Once | ORAL | Status: AC
  Administered 2013-07-31: 2 via ORAL
  Filled 2013-07-31: qty 2

## 2013-07-31 MED ORDER — HYDROMORPHONE HCL PF 1 MG/ML IJ SOLN
2.0000 mg | Freq: Once | INTRAMUSCULAR | Status: AC
  Administered 2013-07-31: 2 mg via INTRAMUSCULAR
  Filled 2013-07-31: qty 2

## 2013-07-31 MED ORDER — AMOXICILLIN 500 MG PO CAPS: 500.0000 mg | ORAL_CAPSULE | Freq: Three times a day (TID) | ORAL | Status: DC

## 2013-07-31 MED ORDER — ONDANSETRON 4 MG PO TBDP
4.0000 mg | ORAL_TABLET | Freq: Once | ORAL | Status: AC
  Administered 2013-07-31: 4 mg via ORAL
  Filled 2013-07-31: qty 1

## 2013-07-31 MED ORDER — HYDROCHLOROTHIAZIDE 25 MG PO TABS
25.0000 mg | ORAL_TABLET | Freq: Every day | ORAL | Status: DC
  Administered 2013-07-31: 25 mg via ORAL
  Filled 2013-07-31: qty 1

## 2013-07-31 MED ORDER — OXYCODONE-ACETAMINOPHEN 5-325 MG PO TABS: 1.0000 | ORAL_TABLET | ORAL | Status: DC | PRN

## 2013-07-31 MED ORDER — DIPHENHYDRAMINE HCL 25 MG PO CAPS
25.0000 mg | ORAL_CAPSULE | Freq: Once | ORAL | Status: AC
  Administered 2013-07-31: 25 mg via ORAL
  Filled 2013-07-31: qty 1

## 2013-07-31 NOTE — ED Provider Notes (Signed)
0200 - Patient care assumed from [place], PA-C at shift change. Patient presenting for dental pain, found to be extremely hypertensive at d/c. Plan includes monitoring of BP and d/c once BP normalizes.  0445 - BP has normalized after receiving Dilaudid and HCTZ. Patient well appearing. She denies CP, SOB and headache. Patient stable for d/c with instruction to f/u with her PCP in 2 days for BP recheck. Return precautions discussed and patient agreeable to plan. Patient d/c'd in good condition.   Filed Vitals:   07/30/13 2155 07/31/13 0221 07/31/13 0350 07/31/13 0441  BP: 176/104 202/138 181/128 170/96  Pulse: 86 99  80  Temp: 98.2 F (36.8 C) 97.9 F (36.6 C)  98.4 F (36.9 C)  TempSrc: Oral Oral  Oral  Resp: 18 18  18   SpO2: 98% 98%  99%     Antonietta Breach, PA-C 07/31/13 [phone]

## 2013-07-31 NOTE — ED Provider Notes (Signed)
CSN: [id number]     Arrival date & time 07/30/13  2148 History   First MD Initiated Contact with Patient 07/31/13 [phone]     Chief Complaint  Patient presents with  . Dental Pain     (Consider location/radiation/quality/duration/timing/severity/associated sxs/prior Treatment) Patient is a 38 y.o. female presenting with tooth pain. The history is provided by the patient. No language interpreter was used.  Dental Pain Location:  Lower Lower teeth location:  30/RL 1st molar Quality:  Aching, constant, throbbing, shooting and sharp Severity:  Severe Onset quality:  Gradual Duration:  1 day Timing:  Constant Progression:  Worsening Chronicity:  New Context: filling fell out and poor dentition   Context: not abscess, cap still on, not crown fracture, not dental caries, not dental fracture, not enamel fracture, not intrusion, not malocclusion, not recent dental surgery and not trauma   Relieved by:  Nothing Worsened by:  Cold food/drink, hot food/drink, touching, pressure and jaw movement Ineffective treatments:  Acetaminophen and NSAIDs Associated symptoms: facial pain   Associated symptoms: no congestion, no difficulty swallowing, no drooling, no facial swelling, no fever, no gum swelling, no headaches, no neck pain, no neck swelling, no oral bleeding, no oral lesions and no trismus   Risk factors: smoking     Past Medical History  Diagnosis Date  . Obesity   . Hypertension   . Polysubstance abuse   . Bipolar 1 disorder   . Schizophrenia   . Depression   . Anxiety    Past Surgical History  Procedure Laterality Date  . Tubal ligation    . Cesarean section     No family history on file. History  Substance Use Topics  . Smoking status: Current Every Day Smoker -- 0.15 packs/day    Types: Cigarettes  . Smokeless tobacco: Not on file  . Alcohol Use: Yes     Comment: 48 oz beer/sporadic   OB History   Grav Para Term Preterm Abortions TAB SAB Ect Mult Living                  Review of Systems  Constitutional: Negative for fever.  HENT: Negative for congestion, drooling, facial swelling and mouth sores.   Musculoskeletal: Negative for neck pain.  Neurological: Negative for headaches.  All other systems reviewed and are negative.     Allergies  Tramadol and Ceftin  Home Medications   Prior to Admission medications   Medication Sig Start Date End Date Taking? Authorizing Provider  acetaminophen (TYLENOL) 500 MG tablet Take 1,000-1,500 mg by mouth every 6 (six) hours as needed (pain).   Yes Historical Provider, MD  HYDROCHLOROTHIAZIDE PO Take 1 tablet by mouth daily.   Yes Historical Provider, MD  HYDROcodone-acetaminophen (NORCO/VICODIN) 5-325 MG per tablet Take 1-2 tablets by mouth every 6 (six) hours as needed for moderate pain or severe pain. 06/15/13  Yes Annett Gula Presson, PA   BP 176/104  Pulse 86  Temp(Src) 98.2 F (36.8 C) (Oral)  Resp 18  SpO2 98%  LMP 07/25/2013 Physical Exam  Constitutional: She is oriented to person, place, and time. She appears well-developed and well-nourished. No distress.  HENT:  Head: Normocephalic and atraumatic.  Eyes: Conjunctivae are normal. No scleral icterus.  Neck: Normal range of motion.  Cardiovascular: Normal rate, regular rhythm and normal heart sounds.  Exam reveals no gallop and no friction rub.   No murmur heard. Pulmonary/Chest: Effort normal and breath sounds normal. No respiratory distress.  Abdominal: Soft. Bowel sounds are  normal. She exhibits no distension and no mass. There is no tenderness. There is no guarding.  Neurological: She is alert and oriented to person, place, and time.  Skin: Skin is warm and dry. She is not diaphoretic.    ED Course  Procedures (including critical care time) Labs Review Labs Reviewed - No data to display  Imaging Review No results found.   EKG Interpretation None      MDM   Final diagnoses:  Pain, dental  HTN (hypertension)    2:47  AM Filed Vitals:   07/30/13 2155 07/31/13 0221  BP: 176/104 202/138  Pulse: 86 99  Temp: 98.2 F (36.8 C) 97.9 F (36.6 C)  TempSrc: Oral Oral  Resp: 18 18  SpO2: 98% 98%   Patient with moderate pain relief and sig elevated bp. Will give im dilaudid. HCTZ. Reevaluate for discharge Patient with toothache.  No gross abscess.  Exam unconcerning for Ludwig's angina or spread of infection.  PA Humes to assume care. Anticipate dischare.   Margarita Mail, PA-C 07/31/13 [phone]

## 2013-07-31 NOTE — Discharge Instructions (Signed)
You have been diagnosed with Dental pain. Please call the follow up dentist first thing in the morning on Monday for a follow up appointment. Keep your discharge paperwork from today's visit to bring to the dentist office. You may also use the resource guide listed below to help you find a dentist if you do not already have one to followup with. It is very important that you get evaluated by a dentist as soon as possible.  Use your pain medication as prescribed and do not operate heavy machinery while on pain medication. Note that your pain medication contains acetaminophen (Tylenol) & its is not reccommended that you use additional acetaminophen (Tylenol) while taking this medication. Take your full course of antibiotics. Read the instructions below. ° °Eat a soft or liquid diet and rinse your mouth out after meals with warm water. You should see a dentist or return here at once if you have increased swelling, increased pain or uncontrolled bleeding from the site of your injury. ° ° °SEEK MEDICAL CARE IF:  °· You have increased pain not controlled with medicines.  °· You have swelling around your tooth, in your face or neck.  °· You have bleeding which starts, continues, or gets worse.  °· You have a fever >101 °· If you are unable to open your mouth °Soft Diet  °The soft diet may be recommended after you were put on a full liquid diet. A normal diet may follow. The soft diet can also be used after surgery if you are too ill to keep down a normal diet. The soft diet may also be needed if you have a hard time chewing foods.  °DESCRIPTION  °Tender foods are used. Foods do not need to be ground or pureed. Most raw fruits and vegetables and coarse breads and cereals should be avoided. Fried foods and highly seasoned foods may cause discomfort.  °NUTRITIONAL ADEQUACY  °A healthy diet is possible if foods from each of the basic food groups are eaten daily.  °SOFT DIET FOOD LISTS  °Milk/Dairy  °Allowed: Milk and milk  drinks, milk shakes, cream cheese, cottage cheese, mild cheeses.  °Avoid: Sharp or highly seasoned cheese. °Meat/Meat Substitutes  °Allowed: Broiled, roasted, baked, or stewed tender lean beef, mutton, lamb, veal, chicken, turkey, liver, ham, crisp bacon, white fish, tuna, salmon. Eggs, smooth peanut butter.  °Avoid: All fried meats, fish, or fowl. Rich gravies and sauces. Lunch meats, sausages, hot dogs. Meats with gristle, chunky peanut butter. °Breads/Grains  °Allowed: Rice, noodles, spaghetti, macaroni. Dry or cooked refined cereals, such as farina, cream of wheat, oatmeal, grits, whole-wheat cereals. Plain or toasted white or wheat blend or whole-grain breads, soda crackers or saltines, flour tortillas.  °Avoid: Wild rice, coarse cereals, such as bran. Seed in or on breads and crackers. Bread or bread products with nuts or seeds. °Fruits/Vegetables  °Allowed: Fruit and vegetable juices, well-cooked or canned fruits and vegetables, any dried fruit. One citrus fruit daily, 1 vitamin A source daily. Well-ripened, easy to chew fruits, sweet potatoes. Baked, boiled, mashed, creamed, scalloped, or au gratin potatoes. Broths or creamed soups made with allowed vegetables, strained tomatoes.  °Avoid: All gas-forming vegetables (corn, radishes, Brussels sprouts, onions, broccoli, cabbage, parsnips, turnips, chili peppers, pinto beans, split peas, dried beans). Fruits containing seeds and skin. Potato chips and corn chips. All others that are not made with allowed vegetables. Highly seasoned soups. °Desserts/Sweets  °Allowed: Simple desserts, such as custard, junkets, gelatin desserts, plain ice cream and sherbets, simple cakes   and cookies, allowed fruits, sugar, syrup, jelly, honey, plain hard candy, and molasses.  °Avoid: Rich pastries, any dessert containing dates, nuts, raisins, or coconut. Fried pastries, such as doughnuts. Chocolate. °Beverages  °Allowed: Fruit and vegetable juices. Caffeine-free carbonated drinks,  coffee, and tea.  °Avoid: Caffeinated beverages: coffee, tea, soda or pop. °Miscellaneous  °Allowed: Butter, cream, margarine, mayonnaise, oil. Cream sauces, salt, and mild spices.  °Avoid: Highly spiced salad dressings. Highly seasoned foods, hot sauce, mustard, horseradish, and pepper. °SAMPLE MENU  °Breakfast  °Orange juice.  °Oatmeal.  °Soft cooked egg.  °Toast and margarine.  °2% milk.  °Coffee. °Lunch  °Meatloaf.  °Mashed potato.  °Green beans.  °Lemon pudding.  °Bread and margarine.  °Coffee. °Dinner  °Consommé or apricot nectar.  °Chicken breast.  °Rice, peas, and carrots.  °Applesauce.  °Bread and margarine.  °2% milk. °To cut the amount of fat in your diet, omit margarine and use 1% or skim milk.  °NUTRIENT ANALYSIS  °Calories........................1953 Kcal.  °Protein.........................102 gm.  °Carbohydrate...............247 gm.  °Fat................................65 gm.  °Cholesterol...................449 mg.  °Dietary fiber.................19 gm.  °Vitamin A.....................2944 RE.  °Vitamin C.....................79 mg.  °Niacin..........................25 mg.  °Riboflavin....................2.0 mg.  °Thiamin.......................1.5 mg.  °Folate..........................249 mcg.  °Calcium.......................1030 mg.  °Phosphorus.................1782 mg.  °Zinc..............................12 mg.  °Iron..............................13 mg.  °Sodium.........................299 mg.  °Potassium....................3046 mg. °Document Released: 06/02/2007 Document Revised: 05/18/2011 Document Reviewed: 06/02/2007  °ExitCare® Patient Information ©2014 [employer].  ° °RESOURCE GUIDE ° ° °Dental Problems ° °Dr. Janna Civilis °$200 dollar visit °[address] °[place], [place] [address]  °[phone] °  ° °Patients with Medicaid: °[facility]                     [facility] °[address]                                           [address] °Phone:   [phone]                                                  Phone:  [phone] ° °If unable to pay or uninsured, contact:  [facility] or [facility] to become qualified for the adult dental clinic. ° °Chronic Pain Problems °Contact [facility]  [phone] °Patients need to be referred by their primary care doctor. ° °Insufficient Money for Medicine °Contact [employer]:  call "211" or [facility] 271-5999. ° °No Primary Care Doctor °Call [facility]  832-8000 °Other agencies that provide inexpensive medical care °   [facility]  [phone] °   [facility]  832-7272 °   [facility]  [phone] °   [facility]  [phone] °   [facility]  [phone] °   [facility]  [phone] ° °Psychological Services °[facility]  [phone] °[facility]  [phone] °[facility]   [phone] (emergency services [phone]) ° °Substance Abuse Resources °[facility]  336-882-2125 °[facility] 336-784-9470 °[facility] 336-285-9073 °[facility] 336-845-3988 °Residential & Outpatient Substance Abuse Program  800-659-3381 ° °Abuse/Neglect °[place] [facility] (336) 641-3795 °[place] [facility] Hotline 800-378-5315 (After Hours) ° °Emergency Shelter °[facility] (336) 271-5985 ° °Maternity Homes °[facility] (336) 275-9566 °[facility] (704) 372-4663 ° °MRSA Hotline #:   832-7006 ° ° ° °[place] [facility] ° °[facility]     [employer]                          [facility] °[address] [place]                       [address]      [address]  °[place]                                                [place]                            [place] °Phone:  [phone]                                   Phone:  [phone]                 Phone:   342-8140 ° °[facility] °Phone:  342-8316 ° °[place] [facility] Hotline °(336) 342-1394 °(336) 342-3537 (After Hours) ° ° ° ° ° ° °

## 2013-08-02 NOTE — ED Provider Notes (Signed)
Medical screening examination/treatment/procedure(s) were performed by non-physician practitioner and as supervising physician I was immediately available for consultation/collaboration.   EKG Interpretation None        Elyn Peers, MD 08/02/13 [phone]

## 2013-08-02 NOTE — ED Provider Notes (Signed)
Medical screening examination/treatment/procedure(s) were performed by non-physician practitioner and as supervising physician I was immediately available for consultation/collaboration.   EKG Interpretation None        Julie Manly, MD 08/02/13 1855 

## 2013-08-24 ENCOUNTER — Emergency Department (INDEPENDENT_AMBULATORY_CARE_PROVIDER_SITE_OTHER)
Admission: EM | Admit: 2013-08-24 | Discharge: 2013-08-24 | Disposition: A | Discharge status: Home / Self Care | Payer: Medicaid Other | Source: Home / Self Care | Attending: Family Medicine | Admitting: Family Medicine

## 2013-08-24 ENCOUNTER — Encounter (HOSPITAL_COMMUNITY): Payer: Self-pay | Admitting: Emergency Medicine

## 2013-08-24 DIAGNOSIS — I10 Essential (primary) hypertension: Secondary | ICD-10-CM

## 2013-08-24 DIAGNOSIS — K089 Disorder of teeth and supporting structures, unspecified: Secondary | ICD-10-CM

## 2013-08-24 DIAGNOSIS — K0889 Other specified disorders of teeth and supporting structures: Secondary | ICD-10-CM

## 2013-08-24 LAB — POCT I-STAT, CHEM 8
BUN: 4 mg/dL — ABNORMAL LOW (ref 6–23)
CALCIUM ION: 1.21 mmol/L (ref 1.12–1.23)
Chloride: 101 mEq/L (ref 96–112)
Creatinine, Ser: 0.9 mg/dL (ref 0.50–1.10)
GLUCOSE: 84 mg/dL (ref 70–99)
HEMATOCRIT: 40 % (ref 36.0–46.0)
Hemoglobin: 13.6 g/dL (ref 12.0–15.0)
POTASSIUM: 3.7 meq/L (ref 3.7–5.3)
Sodium: 143 mEq/L (ref 137–147)
TCO2: 24 mmol/L (ref 0–100)

## 2013-08-24 MED ORDER — HYDROCHLOROTHIAZIDE 25 MG PO TABS
25.0000 mg | ORAL_TABLET | Freq: Every day | ORAL | Status: DC
Start: 2013-08-24 — End: 2014-05-25

## 2013-08-24 MED ORDER — HYDROCODONE-ACETAMINOPHEN 5-325 MG PO TABS
1.0000 | ORAL_TABLET | Freq: Four times a day (QID) | ORAL | Status: DC | PRN
Start: 2013-08-24 — End: 2014-01-08

## 2013-08-24 MED ORDER — AMLODIPINE BESYLATE 10 MG PO TABS: 10.0000 mg | ORAL_TABLET | Freq: Every day | ORAL | Status: DC

## 2013-08-24 NOTE — ED Provider Notes (Signed)
Linda Sheppard is a 38 y.o. female who presents to Urgent Care today for dental pain. Patient has had ongoing dental pain for sometime now. She's been seen by a dentist and an oral surgeon for her problems. However her blood pressure is not well controlled enough for surgery. She notes she takes hydrocodone for the pain which helps some. She has been using ibuprofen as well. She denies any chest pains or palpitations. She feels well otherwise.   Past Medical History  Diagnosis Date  . Obesity   . Hypertension   . Polysubstance abuse   . Bipolar 1 disorder   . Schizophrenia   . Depression   . Anxiety    History  Substance Use Topics  . Smoking status: Current Every Day Smoker -- 0.15 packs/day    Types: Cigarettes  . Smokeless tobacco: Not on file  . Alcohol Use: Yes     Comment: 48 oz beer/sporadic   ROS as above Medications: No current facility-administered medications for this encounter.   Current Outpatient Prescriptions  Medication Sig Dispense Refill  . acetaminophen (TYLENOL) 500 MG tablet Take 1,000-1,500 mg by mouth every 6 (six) hours as needed (pain).      Marland Kitchen amLODipine (NORVASC) 10 MG tablet Take 1 tablet (10 mg total) by mouth daily.  30 tablet  0  . hydrochlorothiazide (HYDRODIURIL) 25 MG tablet Take 1 tablet (25 mg total) by mouth daily.  30 tablet  0  . HYDROcodone-acetaminophen (NORCO/VICODIN) 5-325 MG per tablet Take 1 tablet by mouth every 6 (six) hours as needed.  10 tablet  0  . [DISCONTINUED] risperiDONE (RISPERDAL) 0.5 MG tablet Take 1 tablet (0.5 mg total) by mouth at bedtime. For mood control  30 tablet  0    Exam:  BP 162/112  Pulse 73  Temp(Src) 98.8 F (37.1 C) (Oral)  Resp 18  SpO2 99%  LMP 08/24/2013 Gen: Well NAD HEENT: EOMI,  MMM poor dentition with multiple dental caries. Right lower bicuspid tender to touch. Left lower molar also tender to touch.  Lungs: Normal work of breathing. CTABL Heart: RRR no MRG Abd: NABS, Soft. NT, ND Exts:  Brisk capillary refill, warm and well perfused.   Results for orders placed during the hospital encounter of 08/24/13 (from the past 24 hour(s))  POCT I-STAT, CHEM 8     Status: Abnormal   Collection Time    08/24/13 11:00 AM      Result Value Ref Range   Sodium 143  137 - 147 mEq/L   Potassium 3.7  3.7 - 5.3 mEq/L   Chloride 101  96 - 112 mEq/L   BUN 4 (*) 6 - 23 mg/dL   Creatinine, Ser 0.90  0.50 - 1.10 mg/dL   Glucose, Bld 84  70 - 99 mg/dL   Calcium, Ion 1.21  1.12 - 1.23 mmol/L   TCO2 24  0 - 100 mmol/L   Hemoglobin 13.6  12.0 - 15.0 g/dL   HCT 40.0  36.0 - 46.0 %   No results found.  Assessment and Plan: 38 y.o. female with  1) dental pain:  Refill hydrocodone. Refer to dentist 2) hypertension: Start hydrochlorothiazide and amlodipine. Followup with primary care provider   Discussed warning signs or symptoms. Please see discharge instructions. Patient expresses understanding.    Gregor Hams, MD 08/24/13 [phone]

## 2013-08-24 NOTE — ED Notes (Signed)
Pt c/o lower dental pain onset 1 week BP today is 162/112; has not had BP med x1 yr +++ Denies CP, HA, weakness, nauseas, SOB Alert w/no signs of acute distress.

## 2013-08-24 NOTE — Discharge Instructions (Signed)
Thank you for coming in today. Please follow up with your dentist ASAP.  Take your blood pressure medicine.  We will no longer prescribe pain medicine for this problem.  Call or go to the emergency room if you get worse, have trouble breathing, have chest pains, or palpitations.  Please follow up with your primary doctor.    Dental Pain A tooth ache may be caused by cavities (tooth decay). Cavities expose the nerve of the tooth to air and hot or cold temperatures. It may come from an infection or abscess (also called a boil or furuncle) around your tooth. It is also often caused by dental caries (tooth decay). This causes the pain you are having. DIAGNOSIS  Your caregiver can diagnose this problem by exam. TREATMENT   If caused by an infection, it may be treated with medications which kill germs (antibiotics) and pain medications as prescribed by your caregiver. Take medications as directed.  Only take over-the-counter or prescription medicines for pain, discomfort, or fever as directed by your caregiver.  Whether the tooth ache today is caused by infection or dental disease, you should see your dentist as soon as possible for further care. SEEK MEDICAL CARE IF: The exam and treatment you received today has been provided on an emergency basis only. This is not a substitute for complete medical or dental care. If your problem worsens or new problems (symptoms) appear, and you are unable to meet with your dentist, call or return to this location. SEEK IMMEDIATE MEDICAL CARE IF:   You have a fever.  You develop redness and swelling of your face, jaw, or neck.  You are unable to open your mouth.  You have severe pain uncontrolled by pain medicine. MAKE SURE YOU:   Understand these instructions.  Will watch your condition.  Will get help right away if you are not doing well or get worse. Document Released: 02/23/2005 Document Revised: 05/18/2011 Document Reviewed:  10/12/2007 [facility] Patient Information 2015 [place], [place]. This information is not intended to replace advice given to you by your health care provider. Make sure you discuss any questions you have with your health care provider.   Hypertension Hypertension, commonly called high blood pressure, is when the force of blood pumping through your arteries is too strong. Your arteries are the blood vessels that carry blood from your heart throughout your body. A blood pressure reading consists of a higher number over a lower number, such as 110/72. The higher number (systolic) is the pressure inside your arteries when your heart pumps. The lower number (diastolic) is the pressure inside your arteries when your heart relaxes. Ideally you want your blood pressure below 120/80. Hypertension forces your heart to work harder to pump blood. Your arteries may become narrow or stiff. Having hypertension puts you at risk for heart disease, stroke, and other problems.  RISK FACTORS Some risk factors for high blood pressure are controllable. Others are not.  Risk factors you cannot control include:   Race. You may be at higher risk if you are African American.  Age. Risk increases with age.  Gender. Men are at higher risk than women before age 34 years. After age 27, women are at higher risk than men. Risk factors you can control include:  Not getting enough exercise or physical activity.  Being overweight.  Getting too much fat, sugar, calories, or salt in your diet.  Drinking too much alcohol. SIGNS AND SYMPTOMS Hypertension does not usually cause signs or symptoms. Extremely  high blood pressure (hypertensive crisis) may cause headache, anxiety, shortness of breath, and nosebleed. DIAGNOSIS  To check if you have hypertension, your health care provider will measure your blood pressure while you are seated, with your arm held at the level of your heart. It should be measured at least twice using the  same arm. Certain conditions can cause a difference in blood pressure between your right and left arms. A blood pressure reading that is higher than normal on one occasion does not mean that you need treatment. If one blood pressure reading is high, ask your health care provider about having it checked again. TREATMENT  Treating high blood pressure includes making lifestyle changes and possibly taking medication. Living a healthy lifestyle can help lower high blood pressure. You may need to change some of your habits. Lifestyle changes may include:  Following the DASH diet. This diet is high in fruits, vegetables, and whole grains. It is low in salt, red meat, and added sugars.  Getting at least 2 1/2 hours of brisk physical activity every week.  Losing weight if necessary.  Not smoking.  Limiting alcoholic beverages.  Learning ways to reduce stress. If lifestyle changes are not enough to get your blood pressure under control, your health care provider may prescribe medicine. You may need to take more than one. Work closely with your health care provider to understand the risks and benefits. HOME CARE INSTRUCTIONS  Have your blood pressure rechecked as directed by your health care provider.   Only take medicine as directed by your health care provider. Follow the directions carefully. Blood pressure medicines must be taken as prescribed. The medicine does not work as well when you skip doses. Skipping doses also puts you at risk for problems.   Do not smoke.   Monitor your blood pressure at home as directed by your health care provider. SEEK MEDICAL CARE IF:   You think you are having a reaction to medicines taken.  You have recurrent headaches or feel dizzy.  You have swelling in your ankles.  You have trouble with your vision. SEEK IMMEDIATE MEDICAL CARE IF:  You develop a severe headache or confusion.  You have unusual weakness, numbness, or feel faint.  You have  severe chest or abdominal pain.  You vomit repeatedly.  You have trouble breathing. MAKE SURE YOU:   Understand these instructions.  Will watch your condition.  Will get help right away if you are not doing well or get worse. Document Released: 02/23/2005 Document Revised: 02/28/2013 Document Reviewed: 12/16/2012 [facility] Patient Information 2015 [place], [place]. This information is not intended to replace advice given to you by your health care provider. Make sure you discuss any questions you have with your health care provider.

## 2013-10-18 ENCOUNTER — Encounter (HOSPITAL_COMMUNITY): Payer: Self-pay | Admitting: Emergency Medicine

## 2013-10-18 ENCOUNTER — Emergency Department (INDEPENDENT_AMBULATORY_CARE_PROVIDER_SITE_OTHER)
Admission: EM | Admit: 2013-10-18 | Discharge: 2013-10-18 | Disposition: A | Discharge status: Home / Self Care | Payer: Medicaid Other | Source: Home / Self Care | Attending: Family Medicine | Admitting: Family Medicine

## 2013-10-18 DIAGNOSIS — IMO0002 Reserved for concepts with insufficient information to code with codable children: Secondary | ICD-10-CM

## 2013-10-18 DIAGNOSIS — M1710 Unilateral primary osteoarthritis, unspecified knee: Secondary | ICD-10-CM

## 2013-10-18 DIAGNOSIS — M170 Bilateral primary osteoarthritis of knee: Secondary | ICD-10-CM

## 2013-10-18 MED ORDER — HYDROCODONE-ACETAMINOPHEN 5-325 MG PO TABS: 1.0000 | ORAL_TABLET | Freq: Four times a day (QID) | ORAL | Status: DC | PRN

## 2013-10-18 NOTE — ED Provider Notes (Signed)
Medical screening examination/treatment/procedure(s) were performed by resident physician or non-physician practitioner and as supervising physician I was immediately available for consultation/collaboration.   Pauline Good MD.   Billy Fischer, MD 10/18/13 2031

## 2013-10-18 NOTE — Discharge Instructions (Signed)
Knee Pain Knee pain can be a result of an injury or other medical conditions. Treatment will depend on the cause of your pain. HOME CARE  Only take medicine as told by your doctor.  Keep a healthy weight. Being overweight can make the knee hurt more.  Stretch before exercising or playing sports.  If there is constant knee pain, change the way you exercise. Ask your doctor for advice.  Make sure shoes fit well. Choose the right shoe for the sport or activity.  Protect your knees. Wear kneepads if needed.  Rest when you are tired. GET HELP RIGHT AWAY IF:   Your knee pain does not stop.  Your knee pain does not get better.  Your knee joint feels hot to the touch.  You have a fever. MAKE SURE YOU:   Understand these instructions.  Will watch this condition.  Will get help right away if you are not doing well or get worse. Document Released: 05/22/2008 Document Revised: 05/18/2011 Document Reviewed: 05/22/2008 [facility] Patient Information 2015 [place], [place]. This information is not intended to replace advice given to you by your health care provider. Make sure you discuss any questions you have with your health care provider.  Arthritis, Nonspecific Arthritis is pain, redness, warmth, or puffiness (inflammation) of a joint. The joint may be stiff or hurt when you move it. One or more joints may be affected. There are many types of arthritis. Your doctor may not know what type you have right away. The most common cause of arthritis is wear and tear on the joint (osteoarthritis). HOME CARE   Only take medicine as told by your doctor.  Rest the joint as much as possible.  Raise (elevate) your joint if it is puffy.  Use crutches if the painful joint is in your leg.  Drink enough fluids to keep your pee (urine) clear or pale yellow.  Follow your doctor's diet instructions.  Use cold packs for very bad joint pain for 10 to 15 minutes every hour. Ask your doctor if it is  okay for you to use hot packs.  Exercise as told by your doctor.  Take a warm shower if you have stiffness in the morning.  Move your sore joints throughout the day. GET HELP RIGHT AWAY IF:   You have a fever.  You have very bad joint pain, puffiness, or redness.  You have many joints that are painful and puffy.  You are not getting better with treatment.  You have very bad back pain or leg weakness.  You cannot control when you poop (bowel movement) or pee (urinate).  You do not feel better in 24 hours or are getting worse.  You are having side effects from your medicine. MAKE SURE YOU:   Understand these instructions.  Will watch your condition.  Will get help right away if you are not doing well or get worse. Document Released: 05/20/2009 Document Revised: 08/25/2011 Document Reviewed: 05/20/2009 [facility] Patient Information 2015 [place], [place]. This information is not intended to replace advice given to you by your health care provider. Make sure you discuss any questions you have with your health care provider.

## 2013-10-18 NOTE — ED Notes (Signed)
C/o bil. knee pain.  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  She had steroid injection in both her knees 3 weeks ago with relief 2 1/2 weeks.  Taking Vicodin 10 from Dr. Macky Lower office. They referred her to the orthopedist gave her Vicodin 5 mg. She went back to the orthopedist last Fri.  They wanted to give her more cortisone shots. She declined the next round of shots. Pain got worse last week.  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  The orthopedist said Vicodin 10's were to strong but she said they relieved the pain.

## 2013-10-18 NOTE — ED Provider Notes (Signed)
CSN: [id number]     Arrival date & time 10/18/13  1621 History   First MD Initiated Contact with Patient 10/18/13 1741     Chief Complaint  Patient presents with  . Knee Pain   (Consider location/radiation/quality/duration/timing/severity/associated sxs/prior Treatment) HPI Comments: 38 year old morbidly obese female presents complaining of bilateral knee pain. She has bilateral knee arthritis and is under the care of orthopedic surgeon. She ran out of her pain medicine and she is having increasing pain. She had cortisone injections a month ago. She went back to her orthopedic surgeon and according to the patient they told her they could not help her. She is currently taking Tylenol and ibuprofen, and icing the knees as needed. No new injuries or swelling. She does endorse a lot of clicking and popping in the knees when she climbs stairs.  Patient is a 38 y.o. female presenting with knee pain.  Knee Pain   Past Medical History  Diagnosis Date  . Obesity   . Hypertension   . Polysubstance abuse   . Bipolar 1 disorder   . Schizophrenia   . Depression   . Anxiety    Past Surgical History  Procedure Laterality Date  . Tubal ligation    . Cesarean section     Family History  Problem Relation Age of Onset  . Hypertension Mother   . Osteoarthritis Mother   . Osteoarthritis Father    History  Substance Use Topics  . Smoking status: Former Smoker -- 0.15 packs/day    Types: Cigarettes    Quit date: 10/18/2012  . Smokeless tobacco: Not on file  . Alcohol Use: No     Comment: 48 oz beer/sporadic   OB History   Grav Para Term Preterm Abortions TAB SAB Ect Mult Living                 Review of Systems  Musculoskeletal: Positive for arthralgias.  All other systems reviewed and are negative.   Allergies  Tramadol; Ceftin; and Meloxicam  Home Medications   Prior to Admission medications   Medication Sig Start Date End Date Taking? Authorizing Provider  acetaminophen  (TYLENOL) 500 MG tablet Take 1,000 mg by mouth every 6 (six) hours as needed (pain).    Yes Historical Provider, MD  amLODipine (NORVASC) 10 MG tablet Take 1 tablet (10 mg total) by mouth daily. 08/24/13  Yes Gregor Hams, MD  hydrochlorothiazide (HYDRODIURIL) 25 MG tablet Take 1 tablet (25 mg total) by mouth daily. 08/24/13  Yes Gregor Hams, MD  lisinopril-hydrochlorothiazide (PRINZIDE,ZESTORETIC) 10-12.5 MG per tablet Take 1 tablet by mouth daily.   Yes Historical Provider, MD  HYDROcodone-acetaminophen (NORCO) 5-325 MG per tablet Take 1 tablet by mouth every 6 (six) hours as needed for moderate pain. 10/18/13   Liam Graham, PA-C  HYDROcodone-acetaminophen (NORCO/VICODIN) 5-325 MG per tablet Take 1 tablet by mouth every 6 (six) hours as needed. 08/24/13   Gregor Hams, MD   BP 146/118  Pulse 90  Temp(Src) 99.5 F (37.5 C) (Oral)  Resp 20  SpO2 99%  LMP 09/20/2013 Physical Exam  Nursing note and vitals reviewed. Constitutional: She is oriented to person, place, and time. Vital signs are normal. She appears well-developed and well-nourished. No distress.  Morbidly obese habitus  HENT:  Head: Normocephalic and atraumatic.  Pulmonary/Chest: Effort normal. No respiratory distress.  Musculoskeletal:       Right knee: Tenderness (diffuse) found.       Left knee: Tenderness (Diffuse) found.  Neurological: She is alert and oriented to person, place, and time. She has normal strength. Coordination normal.  Skin: Skin is warm and dry. No rash noted. She is not diaphoretic.  Psychiatric: She has a normal mood and affect. Judgment normal.    ED Course  Procedures (including critical care time) Labs Review Labs Reviewed - No data to display  Imaging Review No results found.   MDM   1. Arthritis of both knees    Chronic arthritis, encourage weight loss. We'll provide a very small quantity of narcotic pain medicines to use as needed for the pain if it becomes too severe.    Liam Graham, PA-C 10/18/13 1827

## 2013-10-30 ENCOUNTER — Other Ambulatory Visit: Payer: Self-pay | Admitting: Orthopedic Surgery

## 2013-10-30 DIAGNOSIS — M25461 Effusion, right knee: Secondary | ICD-10-CM

## 2013-10-30 DIAGNOSIS — M25561 Pain in right knee: Secondary | ICD-10-CM

## 2013-11-09 ENCOUNTER — Other Ambulatory Visit: Payer: Self-pay

## 2014-01-08 ENCOUNTER — Emergency Department (HOSPITAL_COMMUNITY)
Admission: EM | Admit: 2014-01-08 | Discharge: 2014-01-08 | Disposition: A | Discharge status: Home / Self Care | Payer: Medicaid Other | Attending: Emergency Medicine | Admitting: Emergency Medicine

## 2014-01-08 ENCOUNTER — Encounter (HOSPITAL_COMMUNITY): Payer: Self-pay | Admitting: Emergency Medicine

## 2014-01-08 DIAGNOSIS — Z79899 Other long term (current) drug therapy: Secondary | ICD-10-CM | POA: Insufficient documentation

## 2014-01-08 DIAGNOSIS — H5711 Ocular pain, right eye: Secondary | ICD-10-CM | POA: Diagnosis present

## 2014-01-08 DIAGNOSIS — K088 Other specified disorders of teeth and supporting structures: Secondary | ICD-10-CM | POA: Diagnosis not present

## 2014-01-08 DIAGNOSIS — K0889 Other specified disorders of teeth and supporting structures: Secondary | ICD-10-CM

## 2014-01-08 DIAGNOSIS — Z8659 Personal history of other mental and behavioral disorders: Secondary | ICD-10-CM | POA: Diagnosis not present

## 2014-01-08 DIAGNOSIS — H109 Unspecified conjunctivitis: Secondary | ICD-10-CM | POA: Diagnosis not present

## 2014-01-08 DIAGNOSIS — Z87891 Personal history of nicotine dependence: Secondary | ICD-10-CM | POA: Insufficient documentation

## 2014-01-08 DIAGNOSIS — E669 Obesity, unspecified: Secondary | ICD-10-CM | POA: Insufficient documentation

## 2014-01-08 DIAGNOSIS — I10 Essential (primary) hypertension: Secondary | ICD-10-CM | POA: Insufficient documentation

## 2014-01-08 MED ORDER — HYDROCODONE-ACETAMINOPHEN 5-325 MG PO TABS: 1.0000 | ORAL_TABLET | Freq: Four times a day (QID) | ORAL | Status: DC | PRN

## 2014-01-08 MED ORDER — OXYCODONE-ACETAMINOPHEN 5-325 MG PO TABS
1.0000 | ORAL_TABLET | Freq: Once | ORAL | Status: AC
  Administered 2014-01-08: 1 via ORAL
  Filled 2014-01-08: qty 1

## 2014-01-08 MED ORDER — TETRACAINE HCL 0.5 % OP SOLN
1.0000 [drp] | Freq: Once | OPHTHALMIC | Status: AC
  Administered 2014-01-08: 1 [drp] via OPHTHALMIC
  Filled 2014-01-08: qty 2

## 2014-01-08 MED ORDER — PENICILLIN V POTASSIUM 500 MG PO TABS: 500.0000 mg | ORAL_TABLET | Freq: Four times a day (QID) | ORAL | Status: AC

## 2014-01-08 MED ORDER — ERYTHROMYCIN 5 MG/GM OP OINT: TOPICAL_OINTMENT | Freq: Four times a day (QID) | OPHTHALMIC | Status: AC

## 2014-01-08 MED ORDER — FLUORESCEIN SODIUM 1 MG OP STRP
1.0000 | ORAL_STRIP | Freq: Once | OPHTHALMIC | Status: AC
  Administered 2014-01-08: 1 via OPHTHALMIC
  Filled 2014-01-08: qty 1

## 2014-01-08 NOTE — ED Provider Notes (Signed)
CSN: [id number]     Arrival date & time 01/08/14  1643 History  This chart was scribed for a non-physician practitioner, Harvie Heck, PA-C working with Artis Delay, MD by Martinique Peace, ED Scribe. The patient was seen in TR04C/TR04C. The patient's care was started at 6:14 PM.    Chief Complaint  Patient presents with  . Eye Pain  . Abscess      HPI Comments: Linda Sheppard is a 38 y.o. female who presents to the Emergency Department complaining of right eye pain with irritation that started Saturday and redness that started Sunday. Pt also reports blurred vision in affected eye. Reports sensation of sand in Right eye. No known sick contacts. No abnormal vaginal discharge or concern for sexually transmitted infections. Reports wearing fake eyelashes on bilateral eyes, removed after onset of discomfort. Does not wear contact lenses or have corrected vision. Pt reports trauma to mouth onset several months ago. She states that the front of her gums have area of swelling and pain. Pt notes that she followed up with specialist, Dr. Conception Chancy, previously and has appt with him this coming Friday. Pt is smoker.     Patient is a 38 y.o. female presenting with eye pain and abscess. The history is provided by the patient. No language interpreter was used.  Eye Pain  Abscess Associated symptoms: no fever      Past Medical History  Diagnosis Date  . Obesity   . Hypertension   . Polysubstance abuse   . Bipolar 1 disorder   . Schizophrenia   . Depression   . Anxiety    Past Surgical History  Procedure Laterality Date  . Tubal ligation    . Cesarean section     Family History  Problem Relation Age of Onset  . Hypertension Mother   . Osteoarthritis Mother   . Osteoarthritis Father    History  Substance Use Topics  . Smoking status: Former Smoker -- 0.15 packs/day    Types: Cigarettes    Quit date: 10/18/2012  . Smokeless tobacco: Not on file  . Alcohol Use: No     Comment: 48 oz  beer/sporadic   OB History    No data available     Review of Systems  Constitutional: Negative for fever and chills.  HENT: Positive for dental problem.   Eyes: Positive for pain, discharge, redness, itching and visual disturbance.       Decreased vision in right eye.   Genitourinary: Negative for vaginal discharge.  Skin: Negative for rash.      Allergies  Tramadol; Ceftin; and Meloxicam  Home Medications   Prior to Admission medications   Medication Sig Start Date End Date Taking? Authorizing Provider  acetaminophen (TYLENOL) 500 MG tablet Take 1,000 mg by mouth every 6 (six) hours as needed (pain).     Historical Provider, MD  amLODipine (NORVASC) 10 MG tablet Take 1 tablet (10 mg total) by mouth daily. 08/24/13   Gregor Hams, MD  hydrochlorothiazide (HYDRODIURIL) 25 MG tablet Take 1 tablet (25 mg total) by mouth daily. 08/24/13   Gregor Hams, MD  HYDROcodone-acetaminophen (NORCO) 5-325 MG per tablet Take 1 tablet by mouth every 6 (six) hours as needed for moderate pain. 10/18/13   Liam Graham, PA-C  HYDROcodone-acetaminophen (NORCO/VICODIN) 5-325 MG per tablet Take 1 tablet by mouth every 6 (six) hours as needed. 08/24/13   Gregor Hams, MD  lisinopril-hydrochlorothiazide (PRINZIDE,ZESTORETIC) 10-12.5 MG per tablet Take 1 tablet by mouth  daily.    Historical Provider, MD   BP 144/86 mmHg  Pulse 95  Temp(Src) 98.3 F (36.8 C) (Oral)  Resp 18  SpO2 97% Physical Exam  Constitutional: She is oriented to person, place, and time. She appears well-developed and well-nourished. No distress.  HENT:  Head: Normocephalic and atraumatic. Head is without right periorbital erythema and without left periorbital erythema.  Mouth/Throat: Uvula is midline.  Tenderness to palpation of the base of tooth # 25, mild overlying erythema. No obvious abscess or area of fluctuance. No signs of peritonsillar or tonsillar abscess.Oropharynx is clear and without exudates. Soft non-tender  sublingual mucosa, no tongue elevation, no edema to sublingual space, normal voice. Airway patent.   Eyes: EOM are normal. Pupils are equal, round, and reactive to light. Right eye exhibits discharge and exudate. Right conjunctiva is injected. Left conjunctiva is not injected. Right eye exhibits normal extraocular motion.  Slit lamp exam:      The right eye shows no corneal abrasion, no corneal ulcer and no fluorescein uptake.  No dendritic lesions.  Neck: Neck supple.  Pulmonary/Chest: Effort normal. No respiratory distress.  Musculoskeletal: Normal range of motion.  Neurological: She is alert and oriented to person, place, and time.  Skin: Skin is warm and dry.  Psychiatric: She has a normal mood and affect. Her behavior is normal.  Nursing note and vitals reviewed.   ED Course  Procedures (including critical care time) Labs Review Labs Reviewed - No data to display   Imaging Review No results found.   EKG Interpretation None     Medications - No data to display  6:18 PM- Treatment plan was discussed with patient who verbalizes understanding and agrees.   MDM   Final diagnoses:  Conjunctivitis of right eye  Pain, dental   Patient with previous dental trauma followed by dental specialist, plan to keep appointment as needed will treat with antibiotic due to overlying erythema, and pain medication, no obvious drainable abscess. Patient also presents with right conjunctivitis, no concern first x-ray transmitted infection, visual acuity is equal bilaterally. No dendritic lesions, plan to treat for bacterial conjunctivitis, follow-up with PCP as needed. Meds given in ED:  Medications  tetracaine (PONTOCAINE) 0.5 % ophthalmic solution 1 drop (1 drop Right Eye Given 01/08/14 1830)  fluorescein ophthalmic strip 1 strip (1 strip Right Eye Given 01/08/14 1825)  oxyCODONE-acetaminophen (PERCOCET/ROXICET) 5-325 MG per tablet 1 tablet (1 tablet Oral Given 01/08/14 1842)    Discharge  Medication List as of 01/08/2014  6:57 PM    START taking these medications   Details  erythromycin ophthalmic ointment Place into the right eye 4 (four) times daily. Place a 1/2 inch ribbon of ointment into the lower eyelid., Starting 01/08/2014, Until Mon 01/15/14, Print    penicillin v potassium (VEETID) 500 MG tablet Take 1 tablet (500 mg total) by mouth 4 (four) times daily., Starting 01/08/2014, Until Mon 01/15/14, Print       I personally performed the services described in this documentation, which was scribed in my presence. The recorded information has been reviewed and is accurate.   Harvie Heck, PA-C 01/08/14 1932

## 2014-01-08 NOTE — Discharge Instructions (Signed)
Call for a follow up appointment with a Family or Primary Care Provider.  Return if Symptoms worsen.   Take medication as prescribed.  Do not scratch or itch your eye. Warm compress for relief of symptoms. Follow up with your dentist for further evaluation of your dental pain.

## 2014-01-08 NOTE — ED Notes (Signed)
Pt. Stated, my gums in front are abscessed, and my eye is itching

## 2014-01-08 NOTE — ED Notes (Signed)
Pt c/o eye redness and irritation on the right side and lip abscess

## 2014-05-19 ENCOUNTER — Encounter (HOSPITAL_COMMUNITY): Payer: Self-pay | Admitting: Emergency Medicine

## 2014-05-19 ENCOUNTER — Emergency Department (HOSPITAL_COMMUNITY)
Admission: EM | Admit: 2014-05-19 | Discharge: 2014-05-20 | Disposition: A | Discharge status: Home / Self Care | Payer: Medicaid Other | Attending: Emergency Medicine | Admitting: Emergency Medicine

## 2014-05-19 DIAGNOSIS — Z79899 Other long term (current) drug therapy: Secondary | ICD-10-CM | POA: Insufficient documentation

## 2014-05-19 DIAGNOSIS — M549 Dorsalgia, unspecified: Secondary | ICD-10-CM | POA: Diagnosis present

## 2014-05-19 DIAGNOSIS — N760 Acute vaginitis: Secondary | ICD-10-CM | POA: Diagnosis not present

## 2014-05-19 DIAGNOSIS — Z8659 Personal history of other mental and behavioral disorders: Secondary | ICD-10-CM | POA: Insufficient documentation

## 2014-05-19 DIAGNOSIS — Z3202 Encounter for pregnancy test, result negative: Secondary | ICD-10-CM | POA: Insufficient documentation

## 2014-05-19 DIAGNOSIS — E669 Obesity, unspecified: Secondary | ICD-10-CM | POA: Insufficient documentation

## 2014-05-19 DIAGNOSIS — Z720 Tobacco use: Secondary | ICD-10-CM | POA: Insufficient documentation

## 2014-05-19 DIAGNOSIS — I10 Essential (primary) hypertension: Secondary | ICD-10-CM | POA: Diagnosis not present

## 2014-05-19 DIAGNOSIS — D259 Leiomyoma of uterus, unspecified: Secondary | ICD-10-CM | POA: Insufficient documentation

## 2014-05-19 DIAGNOSIS — B9689 Other specified bacterial agents as the cause of diseases classified elsewhere: Secondary | ICD-10-CM

## 2014-05-19 LAB — CBC WITH DIFFERENTIAL/PLATELET
BASOS ABS: 0 10*3/uL (ref 0.0–0.1)
Basophils Relative: 0 % (ref 0–1)
Eosinophils Absolute: 0.1 10*3/uL (ref 0.0–0.7)
Eosinophils Relative: 1 % (ref 0–5)
HCT: 34.5 % — ABNORMAL LOW (ref 36.0–46.0)
Hemoglobin: 11.4 g/dL — ABNORMAL LOW (ref 12.0–15.0)
LYMPHS PCT: 29 % (ref 12–46)
Lymphs Abs: 3 10*3/uL (ref 0.7–4.0)
MCH: 24.9 pg — ABNORMAL LOW (ref 26.0–34.0)
MCHC: 33 g/dL (ref 30.0–36.0)
MCV: 75.3 fL — AB (ref 78.0–100.0)
MONO ABS: 0.8 10*3/uL (ref 0.1–1.0)
Monocytes Relative: 8 % (ref 3–12)
Neutro Abs: 6.2 10*3/uL (ref 1.7–7.7)
Neutrophils Relative %: 62 % (ref 43–77)
Platelets: 290 10*3/uL (ref 150–400)
RBC: 4.58 MIL/uL (ref 3.87–5.11)
RDW: 14 % (ref 11.5–15.5)
WBC: 10.1 10*3/uL (ref 4.0–10.5)

## 2014-05-19 LAB — URINALYSIS, ROUTINE W REFLEX MICROSCOPIC
BILIRUBIN URINE: NEGATIVE
Glucose, UA: NEGATIVE mg/dL
Hgb urine dipstick: NEGATIVE
KETONES UR: NEGATIVE mg/dL
LEUKOCYTES UA: NEGATIVE
Nitrite: NEGATIVE
PROTEIN: NEGATIVE mg/dL
SPECIFIC GRAVITY, URINE: 1.018 (ref 1.005–1.030)
Urobilinogen, UA: 0.2 mg/dL (ref 0.0–1.0)
pH: 5.5 (ref 5.0–8.0)

## 2014-05-19 LAB — POC URINE PREG, ED: Preg Test, Ur: NEGATIVE

## 2014-05-19 MED ORDER — MORPHINE SULFATE 4 MG/ML IJ SOLN
4.0000 mg | Freq: Once | INTRAMUSCULAR | Status: AC
  Administered 2014-05-19: 4 mg via INTRAVENOUS
  Filled 2014-05-19: qty 1

## 2014-05-19 MED ORDER — ONDANSETRON HCL 4 MG/2ML IJ SOLN
4.0000 mg | INTRAMUSCULAR | Status: AC
  Administered 2014-05-19: 4 mg via INTRAVENOUS
  Filled 2014-05-19: qty 2

## 2014-05-19 MED ORDER — SODIUM CHLORIDE 0.9 % IV BOLUS (SEPSIS)
1000.0000 mL | INTRAVENOUS | Status: AC
  Administered 2014-05-19: 1000 mL via INTRAVENOUS

## 2014-05-19 NOTE — ED Notes (Signed)
Pt reports lower back pain since yesterday. Pt reports dental abscess.

## 2014-05-19 NOTE — ED Provider Notes (Signed)
CSN: [id number]     Arrival date & time 05/19/14  2203 History   First MD Initiated Contact with Patient 05/19/14 2225     This chart was scribed for non-physician practitioner working with Rolland Porter, MD by Forrestine Him, ED Scribe. This patient was seen in room [place] and the patient's care was started at 11:44 PM.   Chief Complaint  Patient presents with  . Back Pain  . Dental Pain   The history is provided by the patient. No language interpreter was used.    HPI Comments: Linda Sheppard is a 39 y.o. female with a PMHx of HTN, polysubstance abuse, bipolar 1 disorder, schizophrenia, depression, and anxiety who presents to the Emergency Department complaining of intermittent, severe L sided flank pain x 1 day while at rest. Pt states pain has progressively worsened and is now radiating to her lower abdomen. Pain is exacerbated with certain positions and when laying down. Pt states pain is mildly alleviated when sitting up, however, she states if sitting up for too long, pain returns.. Linda Sheppard has not tried any OTC medications or home remedies prior to arrival. She denies any fever, chills, nausea, vomiting, diarrhea, or dysuria. Pt admits to a prior history of back pain but states flare ups come and go. Pt with known allergies to Tramadol, Ceftin, and Meloxicam.  Past Medical History  Diagnosis Date  . Obesity   . Hypertension   . Polysubstance abuse   . Bipolar 1 disorder   . Schizophrenia   . Depression   . Anxiety    Past Surgical History  Procedure Laterality Date  . Tubal ligation    . Cesarean section     Family History  Problem Relation Age of Onset  . Hypertension Mother   . Osteoarthritis Mother   . Osteoarthritis Father    History  Substance Use Topics  . Smoking status: Current Every Day Smoker -- 0.15 packs/day    Types: Cigarettes    Last Attempt to Quit: 10/18/2012  . Smokeless tobacco: Not on file  . Alcohol Use: No     Comment: 48 oz  beer/sporadic   OB History    No data available     Review of Systems  Constitutional: Negative for fever and chills.  HENT: Negative for sore throat.   Eyes: Negative for visual disturbance.  Respiratory: Negative for cough and shortness of breath.   Cardiovascular: Negative for chest pain and leg swelling.  Gastrointestinal: Positive for abdominal pain. Negative for nausea, vomiting and diarrhea.  Genitourinary: Positive for frequency and vaginal discharge. Negative for dysuria.  Musculoskeletal: Positive for back pain. Negative for myalgias.  Skin: Negative for rash.  Neurological: Negative for weakness, numbness and headaches.      Allergies  Tramadol; Ceftin; and Meloxicam  Home Medications   Prior to Admission medications   Medication Sig Start Date End Date Taking? Authorizing Provider  acetaminophen (TYLENOL) 325 MG tablet Take 975 mg by mouth every 6 (six) hours as needed for moderate pain.   Yes Historical Provider, MD  amLODipine (NORVASC) 10 MG tablet Take 1 tablet (10 mg total) by mouth daily. 08/24/13  Yes Gregor Hams, MD  lisinopril-hydrochlorothiazide (PRINZIDE,ZESTORETIC) 10-12.5 MG per tablet Take 1 tablet by mouth daily.   Yes Historical Provider, MD  hydrochlorothiazide (HYDRODIURIL) 25 MG tablet Take 1 tablet (25 mg total) by mouth daily. Patient not taking: Reported on 05/19/2014 08/24/13   Gregor Hams, MD  HYDROcodone-acetaminophen ([place]) 5-325 MG per  tablet Take 1 tablet by mouth every 6 (six) hours as needed for moderate pain. Patient not taking: Reported on 05/19/2014 10/18/13   Liam Graham, PA-C  HYDROcodone-acetaminophen (NORCO/VICODIN) 5-325 MG per tablet Take 1 tablet by mouth every 6 (six) hours as needed for moderate pain or severe pain. Patient not taking: Reported on 05/19/2014 01/08/14   Harvie Heck, PA-C   Triage Vitals: BP 136/106 mmHg  Pulse 86  Temp(Src) 98.3 F (36.8 C) (Oral)  Resp 18  SpO2 100%  LMP 04/19/2014   Physical Exam   Constitutional: She is oriented to person, place, and time. She appears well-developed and well-nourished. No distress.  HENT:  Head: Normocephalic and atraumatic.  Mouth/Throat: Oropharynx is clear and moist.  Eyes: Conjunctivae and EOM are normal.  Neck: Normal range of motion. Neck supple.  Cardiovascular: Normal rate, regular rhythm and intact distal pulses.   Pulmonary/Chest: Effort normal and breath sounds normal. No respiratory distress. She has no wheezes. She has no rales. She exhibits no tenderness.  Abdominal: Soft. She exhibits no distension and no mass. There is no hepatosplenomegaly. There is tenderness. There is CVA tenderness. There is no rigidity, no rebound, no guarding, no tenderness at McBurney's point and negative Murphy's sign.    L flank pain that radiates to LLQ  Genitourinary: There is no tenderness on the right labia. There is no tenderness on the left labia. Cervix exhibits discharge. Cervix exhibits no motion tenderness and no friability. Right adnexum displays no tenderness. Left adnexum displays no tenderness.  Ebony discharge in vaginal vault  Musculoskeletal: Normal range of motion. She exhibits no tenderness.  Lymphadenopathy:    She has no cervical adenopathy.  Neurological: She is alert and oriented to person, place, and time.  Skin: Skin is warm and dry. No rash noted. She is not diaphoretic.  Psychiatric: She has a normal mood and affect.  Nursing note and vitals reviewed.   ED Course  Procedures (including critical care time)  DIAGNOSTIC STUDIES: Oxygen Saturation is 100% on RA, Normal by my interpretation.    COORDINATION OF CARE: 11:44 PM-Discussed treatment plan with pt at bedside and pt agreed to plan.     Labs Review Labs Reviewed  WET PREP, GENITAL - Abnormal; Notable for the following:    Clue Cells Wet Prep HPF POC MANY (*)    WBC, Wet Prep HPF POC FEW (*)    All other components within normal limits  CBC WITH  DIFFERENTIAL/PLATELET - Abnormal; Notable for the following:    Hemoglobin 11.4 (*)    HCT 34.5 (*)    MCV 75.3 (*)    MCH 24.9 (*)    All other components within normal limits  COMPREHENSIVE METABOLIC PANEL - Abnormal; Notable for the following:    Glucose, Bld 104 (*)    Total Bilirubin 0.2 (*)    GFR calc non Af Amer 75 (*)    GFR calc Af Amer 87 (*)    All other components within normal limits  URINALYSIS, ROUTINE W REFLEX MICROSCOPIC - Abnormal; Notable for the following:    APPearance CLOUDY (*)    All other components within normal limits  LIPASE, BLOOD  POC URINE PREG, ED  GC/CHLAMYDIA PROBE AMP ([place])    Imaging Review Ct Abdomen Pelvis W Contrast  05/20/2014   CLINICAL DATA:  Lower abdominal and left flank pain for 1 day.  EXAM: CT ABDOMEN AND PELVIS WITH CONTRAST  TECHNIQUE: Multidetector CT imaging of the abdomen and pelvis  was performed using the standard protocol following bolus administration of intravenous contrast.  CONTRAST:  74mL OMNIPAQUE IOHEXOL 300 MG/ML SOLN, 162mL OMNIPAQUE IOHEXOL 300 MG/ML SOLN  COMPARISON:  None.  FINDINGS: Minimal atelectasis at the lung bases.  Mild hepatic steatosis. No focal hepatic lesion. Gallbladder is decompressed. Spleen is small but otherwise normal. Pancreas and adrenal glands are normal. Kidneys are symmetric in size without stones or hydronephrosis. No perinephric stranding.  Stomach is distended with ingested contrast. There are no dilated or thickened bowel loops. The appendix is normal. There is a small to moderate volume of colonic stool. Diverticulosis of the sigmoid colon without diverticulitis. No free air, free fluid, or intra-abdominal fluid collection.  The abdominal aorta is normal in caliber. No retroperitoneal adenopathy.  Within the pelvis the uterus appears prominent in size there is a 6.4 x 5.8 cm hypodensity in the left uterine fundus likely degenerating fibroid. A calcified fibroid is seen in the distal fundus.  Both ovaries are normal in size. Question trace pelvic free fluid, no pelvic ascites.  There are no acute or suspicious osseous abnormalities. Facet arthropathy is seen in the lower lumbar spine.  IMPRESSION: 1. Fibroid uterus with probable degenerating 6.4 cm fibroid in the left fundus. 2. There is otherwise no acute abnormality in the abdomen/pelvis. 3. Mild hepatic steatosis. Diverticulosis of the sigmoid colon without diverticulitis.   Electronically Signed   By: Jeb Levering M.D.   On: 05/20/2014 04:23     EKG Interpretation None      MDM   Final diagnoses:  Bacterial vaginosis  Uterine leiomyoma, unspecified location   39 yo with left flank pain radiating to LLQ pain, with vaginal discharge. Consider kidney stone vs STI vs ovarian pathology.  CBC, CMP, Lipase, UA, Upreg, Pelvic exam and Wet prep. NS bolus, pain and nausea meds.  Labs reviewed: No significant abnormality.  Pelvic exam: no CMT or adnexal tenderness, wet prep: Shows many clue cells, pt continues to have LLQ TTP.  CT abd/pelvis done.  Results show left fundus uterine fibroid.  Discussed findings with pt.  Will treat with flagyl for BV and NSAIDS for uterine fibroids. Pt is well-appearing, in no acute distress and vital signs reviewed and not concerning. She appears safe to be discharged.  Discharge include follow-up with [facility].  Return precautions provided. I have also discussed reasons to return immediately to the ER.  Patient expresses understanding and agrees with plan.   I personally performed the services described in this documentation, which was scribed in my presence. The recorded information has been reviewed and is accurate.  Filed Vitals:   05/19/14 2215 05/20/14 0133 05/20/14 0330 05/20/14 0504  BP: 136/106 155/101 154/107 134/79  Pulse: 86 89 86 76  Temp: 98.3 F (36.8 C)     TempSrc: Oral     Resp: 18 14 18 18   SpO2: 100% 100% 96% 100%   Meds given in ED:  Medications   morphine 4 MG/ML injection 4 mg (4 mg Intravenous Given 05/19/14 2343)  ondansetron (ZOFRAN) injection 4 mg (4 mg Intravenous Given 05/19/14 2342)  sodium chloride 0.9 % bolus 1,000 mL (0 mLs Intravenous Stopped 05/20/14 0045)  HYDROmorphone (DILAUDID) injection 1 mg (1 mg Intravenous Given 05/20/14 0154)  iohexol (OMNIPAQUE) 300 MG/ML solution 50 mL (50 mLs Oral Contrast Given 05/20/14 0311)  iohexol (OMNIPAQUE) 300 MG/ML solution 100 mL (100 mLs Intravenous Contrast Given 05/20/14 0344)    Discharge Medication List as of 05/20/2014  4:44 AM  START taking these medications   Details  metroNIDAZOLE (FLAGYL) 500 MG tablet Take 1 tablet (500 mg total) by mouth 2 (two) times daily., Starting 05/20/2014, Until Discontinued, Print         Britt Bottom, NP 05/21/14 2029  Rolland Porter, MD 05/29/14 [phone]

## 2014-05-20 ENCOUNTER — Emergency Department (HOSPITAL_COMMUNITY): Payer: Medicaid Other

## 2014-05-20 LAB — LIPASE, BLOOD: Lipase: 27 U/L (ref 11–59)

## 2014-05-20 LAB — COMPREHENSIVE METABOLIC PANEL
ALBUMIN: 3.6 g/dL (ref 3.5–5.2)
ALT: 12 U/L (ref 0–35)
AST: 19 U/L (ref 0–37)
Alkaline Phosphatase: 82 U/L (ref 39–117)
Anion gap: 8 (ref 5–15)
BUN: 13 mg/dL (ref 6–23)
CALCIUM: 8.6 mg/dL (ref 8.4–10.5)
CHLORIDE: 103 mmol/L (ref 96–112)
CO2: 25 mmol/L (ref 19–32)
Creatinine, Ser: 0.96 mg/dL (ref 0.50–1.10)
GFR calc Af Amer: 87 mL/min — ABNORMAL LOW (ref >90)
GFR calc non Af Amer: 75 mL/min — ABNORMAL LOW (ref >90)
Glucose, Bld: 104 mg/dL — ABNORMAL HIGH (ref 70–99)
POTASSIUM: 3.9 mmol/L (ref 3.5–5.1)
Sodium: 136 mmol/L (ref 135–145)
Total Bilirubin: 0.2 mg/dL — ABNORMAL LOW (ref 0.3–1.2)
Total Protein: 7.5 g/dL (ref 6.0–8.3)

## 2014-05-20 LAB — WET PREP, GENITAL
TRICH WET PREP: NONE SEEN
Yeast Wet Prep HPF POC: NONE SEEN

## 2014-05-20 MED ORDER — IOHEXOL 300 MG/ML  SOLN
50.0000 mL | Freq: Once | INTRAMUSCULAR | Status: AC | PRN
  Administered 2014-05-20: 50 mL via ORAL

## 2014-05-20 MED ORDER — HYDROCODONE-ACETAMINOPHEN 5-325 MG PO TABS: 1.0000 | ORAL_TABLET | ORAL | Status: DC | PRN

## 2014-05-20 MED ORDER — HYDROMORPHONE HCL 1 MG/ML IJ SOLN
1.0000 mg | Freq: Once | INTRAMUSCULAR | Status: AC
  Administered 2014-05-20: 1 mg via INTRAVENOUS
  Filled 2014-05-20: qty 1

## 2014-05-20 MED ORDER — METRONIDAZOLE 500 MG PO TABS: 500.0000 mg | ORAL_TABLET | Freq: Two times a day (BID) | ORAL | Status: DC

## 2014-05-20 MED ORDER — IOHEXOL 300 MG/ML  SOLN
100.0000 mL | Freq: Once | INTRAMUSCULAR | Status: AC | PRN
  Administered 2014-05-20: 100 mL via INTRAVENOUS

## 2014-05-20 NOTE — Discharge Instructions (Signed)
Please follow the directions provided.  Be sure to follow-up with the women's outpatient clinic for further management of uterine fibroids.  You may take ibuprofen for pain.  You may take the vicodin for pain not relieved by the ibuprofen.  Please take the Flagyl until it is all gone to treat the bacterial vaginosis.  Do not drink alcohol while taking this medication.  Don't hesitate to return for any new, worsening or concerning symptoms.    SEEK IMMEDIATE MEDICAL CARE IF:  You have pelvic pain or cramps not controlled with medicines.  You have a sudden increase in pelvic pain.  You have an increase in bleeding between and during periods.  You have excessive periods and soak tampons or pads in a half hour or less.  You feel lightheaded or have fainting episodes

## 2014-05-20 NOTE — ED Notes (Signed)
Awake. Verbally responsive. A/O x4. Resp even and unlabored. No audible adventitious breath sounds noted. ABC's intact.  

## 2014-05-22 ENCOUNTER — Emergency Department (HOSPITAL_COMMUNITY)
Admission: EM | Admit: 2014-05-22 | Discharge: 2014-05-22 | Disposition: A | Discharge status: Home / Self Care | Payer: Medicaid Other | Attending: Emergency Medicine | Admitting: Emergency Medicine

## 2014-05-22 ENCOUNTER — Encounter (HOSPITAL_COMMUNITY): Payer: Self-pay

## 2014-05-22 DIAGNOSIS — Z79899 Other long term (current) drug therapy: Secondary | ICD-10-CM | POA: Diagnosis not present

## 2014-05-22 DIAGNOSIS — Z792 Long term (current) use of antibiotics: Secondary | ICD-10-CM | POA: Insufficient documentation

## 2014-05-22 DIAGNOSIS — Z720 Tobacco use: Secondary | ICD-10-CM | POA: Diagnosis not present

## 2014-05-22 DIAGNOSIS — D259 Leiomyoma of uterus, unspecified: Secondary | ICD-10-CM

## 2014-05-22 DIAGNOSIS — E669 Obesity, unspecified: Secondary | ICD-10-CM | POA: Diagnosis not present

## 2014-05-22 DIAGNOSIS — M545 Low back pain: Secondary | ICD-10-CM | POA: Diagnosis present

## 2014-05-22 DIAGNOSIS — I10 Essential (primary) hypertension: Secondary | ICD-10-CM | POA: Diagnosis not present

## 2014-05-22 DIAGNOSIS — Z8659 Personal history of other mental and behavioral disorders: Secondary | ICD-10-CM | POA: Insufficient documentation

## 2014-05-22 LAB — CBC WITH DIFFERENTIAL/PLATELET
BASOS ABS: 0 10*3/uL (ref 0.0–0.1)
Basophils Relative: 0 % (ref 0–1)
EOS ABS: 0 10*3/uL (ref 0.0–0.7)
Eosinophils Relative: 0 % (ref 0–5)
HEMATOCRIT: 37.1 % (ref 36.0–46.0)
HEMOGLOBIN: 12.3 g/dL (ref 12.0–15.0)
Lymphocytes Relative: 18 % (ref 12–46)
Lymphs Abs: 1.8 10*3/uL (ref 0.7–4.0)
MCH: 25 pg — ABNORMAL LOW (ref 26.0–34.0)
MCHC: 33.2 g/dL (ref 30.0–36.0)
MCV: 75.4 fL — ABNORMAL LOW (ref 78.0–100.0)
Monocytes Absolute: 0.8 10*3/uL (ref 0.1–1.0)
Monocytes Relative: 8 % (ref 3–12)
NEUTROS PCT: 74 % (ref 43–77)
Neutro Abs: 7.5 10*3/uL (ref 1.7–7.7)
PLATELETS: 330 10*3/uL (ref 150–400)
RBC: 4.92 MIL/uL (ref 3.87–5.11)
RDW: 14 % (ref 11.5–15.5)
WBC: 10.1 10*3/uL (ref 4.0–10.5)

## 2014-05-22 LAB — COMPREHENSIVE METABOLIC PANEL
ALBUMIN: 3.9 g/dL (ref 3.5–5.2)
ALT: 15 U/L (ref 0–35)
AST: 22 U/L (ref 0–37)
Alkaline Phosphatase: 82 U/L (ref 39–117)
Anion gap: 8 (ref 5–15)
BUN: 10 mg/dL (ref 6–23)
CALCIUM: 8.5 mg/dL (ref 8.4–10.5)
CO2: 23 mmol/L (ref 19–32)
Chloride: 103 mmol/L (ref 96–112)
Creatinine, Ser: 0.95 mg/dL (ref 0.50–1.10)
GFR calc non Af Amer: 76 mL/min — ABNORMAL LOW (ref >90)
GFR, EST AFRICAN AMERICAN: 88 mL/min — AB (ref >90)
Glucose, Bld: 137 mg/dL — ABNORMAL HIGH (ref 70–99)
POTASSIUM: 3.5 mmol/L (ref 3.5–5.1)
SODIUM: 134 mmol/L — AB (ref 135–145)
TOTAL PROTEIN: 7.8 g/dL (ref 6.0–8.3)
Total Bilirubin: 0.5 mg/dL (ref 0.3–1.2)

## 2014-05-22 LAB — GC/CHLAMYDIA PROBE AMP (~~LOC~~) NOT AT ARMC
Chlamydia: NEGATIVE
Neisseria Gonorrhea: NEGATIVE

## 2014-05-22 MED ORDER — NAPROXEN SODIUM 220 MG PO TABS: 440.0000 mg | ORAL_TABLET | Freq: Two times a day (BID) | ORAL | Status: DC | PRN

## 2014-05-22 MED ORDER — KETOROLAC TROMETHAMINE 30 MG/ML IJ SOLN
30.0000 mg | Freq: Once | INTRAMUSCULAR | Status: AC
  Administered 2014-05-22: 30 mg via INTRAVENOUS
  Filled 2014-05-22: qty 1

## 2014-05-22 MED ORDER — HYDROCODONE-ACETAMINOPHEN 5-325 MG PO TABS: 1.0000 | ORAL_TABLET | ORAL | Status: DC | PRN

## 2014-05-22 MED ORDER — MORPHINE SULFATE 4 MG/ML IJ SOLN
4.0000 mg | Freq: Once | INTRAMUSCULAR | Status: AC
  Administered 2014-05-22: 4 mg via INTRAVENOUS
  Filled 2014-05-22: qty 1

## 2014-05-22 NOTE — Discharge Instructions (Signed)
Fibroids Fibroids are lumps (tumors) that can occur any place in a woman's body. These lumps are not cancerous. Fibroids vary in size, weight, and where they grow. HOME CARE  Do not take aspirin.  Write down the number of pads or tampons you use during your period. Tell your doctor. This can help determine the best treatment for you. GET HELP RIGHT AWAY IF:  You have pain in your lower belly (abdomen) that is not helped with medicine.  You have cramps that are not helped with medicine.  You have more bleeding between or during your period.  You feel lightheaded or pass out (faint).  Your lower belly pain gets worse. MAKE SURE YOU:  Understand these instructions.  Will watch your condition.  Will get help right away if you are not doing well or get worse. Document Released: 03/28/2010 Document Revised: 05/18/2011 Document Reviewed: 03/28/2010 Exit[employer] Patient Information 2015 [employer]. This information is not intended to replace advice given to you by your health care provider. Make sure you discuss any questions you have with your health care provider.  

## 2014-05-22 NOTE — ED Provider Notes (Signed)
CSN: [id number]     Arrival date & time 05/22/14  1813 History   First MD Initiated Contact with Patient 05/22/14 2210     Chief Complaint  Patient presents with  . Abdominal Pain  . Nausea   HPI Pt started having pain in her lower back on Friday.  On Saturday the pain started  Moving towards the front of her abdomen.  She has been having pain in the LLQ since that time.    Pt was seen in th ED on 3/14 and had an evaluation. Since that time she has been having pain off and on.  She will feel OK and then the pain will return at increasing intensity.  She will have intermittent sharp pains in the llq like an electric shock.   Past Medical History  Diagnosis Date  . Obesity   . Hypertension   . Polysubstance abuse   . Bipolar 1 disorder   . Schizophrenia   . Depression   . Anxiety    Past Surgical History  Procedure Laterality Date  . Tubal ligation    . Cesarean section     Family History  Problem Relation Age of Onset  . Hypertension Mother   . Osteoarthritis Mother   . Osteoarthritis Father    History  Substance Use Topics  . Smoking status: Current Every Day Smoker -- 0.15 packs/day    Types: Cigarettes    Last Attempt to Quit: 10/18/2012  . Smokeless tobacco: Not on file  . Alcohol Use: No     Comment: 48 oz beer/sporadic   OB History    No data available     Review of Systems  All other systems reviewed and are negative.     Allergies  Tramadol; Ceftin; and Meloxicam  Home Medications   Prior to Admission medications   Medication Sig Start Date End Date Taking? Authorizing Provider  acetaminophen (TYLENOL) 325 MG tablet Take 975 mg by mouth every 6 (six) hours as needed for moderate pain.   Yes Historical Provider, MD  amLODipine (NORVASC) 10 MG tablet Take 1 tablet (10 mg total) by mouth daily. 08/24/13  Yes Gregor Hams, MD  diphenhydramine-acetaminophen (TYLENOL PM) 25-500 MG TABS Take 1 tablet by mouth at bedtime as needed (sleep).   Yes Historical  Provider, MD  lisinopril-hydrochlorothiazide (PRINZIDE,ZESTORETIC) 10-12.5 MG per tablet Take 1 tablet by mouth daily.   Yes Historical Provider, MD  metroNIDAZOLE (FLAGYL) 500 MG tablet Take 1 tablet (500 mg total) by mouth 2 (two) times daily. 05/20/14  Yes Britt Bottom, NP  hydrochlorothiazide (HYDRODIURIL) 25 MG tablet Take 1 tablet (25 mg total) by mouth daily. Patient not taking: Reported on 05/19/2014 08/24/13   Gregor Hams, MD  HYDROcodone-acetaminophen (NORCO/VICODIN) 5-325 MG per tablet Take 1-2 tablets by mouth every 4 (four) hours as needed. 05/22/14   Dorie Rank, MD  naproxen sodium (ANAPROX) 220 MG tablet Take 2 tablets (440 mg total) by mouth 2 (two) times daily as needed (pain). 05/22/14   Dorie Rank, MD   BP 135/99 mmHg  Pulse 108  Temp(Src) 98.8 F (37.1 C) (Oral)  Resp 18  SpO2 98%  LMP 05/18/2014 Physical Exam  Constitutional: She appears well-developed and well-nourished. No distress.  HENT:  Head: Normocephalic and atraumatic.  Right Ear: External ear normal.  Left Ear: External ear normal.  Eyes: Conjunctivae are normal. Right eye exhibits no discharge. Left eye exhibits no discharge. No scleral icterus.  Neck: Neck supple. No tracheal deviation present.  Cardiovascular: Normal rate, regular rhythm and intact distal pulses.   Pulmonary/Chest: Effort normal and breath sounds normal. No stridor. No respiratory distress. She has no wheezes. She has no rales.  Abdominal: Soft. Bowel sounds are normal. She exhibits no distension. There is tenderness in the left lower quadrant. There is no rebound and no guarding.  Musculoskeletal: She exhibits no edema or tenderness.  Neurological: She is alert. She has normal strength. No cranial nerve deficit (no facial droop, extraocular movements intact, no slurred speech) or sensory deficit. She exhibits normal muscle tone. She displays no seizure activity. Coordination normal.  Skin: Skin is warm and dry. No rash noted.   Psychiatric: She has a normal mood and affect.  Nursing note and vitals reviewed.   ED Course  Procedures (including critical care time) Labs Review Labs Reviewed  CBC WITH DIFFERENTIAL/PLATELET - Abnormal; Notable for the following:    MCV 75.4 (*)    MCH 25.0 (*)    All other components within normal limits  COMPREHENSIVE METABOLIC PANEL - Abnormal; Notable for the following:    Sodium 134 (*)    Glucose, Bld 137 (*)    GFR calc non Af Amer 76 (*)    GFR calc Af Amer 88 (*)    All other components within normal limits    Medications  ketorolac (TORADOL) 30 MG/ML injection 30 mg (not administered)  morphine 4 MG/ML injection 4 mg (not administered)     MDM   Final diagnoses:  Uterine leiomyoma, unspecified location    The patient had an extensive evaluation the other evening. She had laboratory tests pelvic exam and CT scan. CT scan showed a probable degenerating fibroid. This is the likely cause of the patient's lower abdominal pain. I reviewed the findings with the patient. I reassured her that this was not a life-threatening condition. I'll discharge patient home on NSAIDs and hydrocodone. He did the patient's allergies with her. She is able to take    Dorie Rank, MD 05/22/14 2256

## 2014-05-22 NOTE — ED Notes (Signed)
Pt c/o LLQ abdominal pain and nausea x 3 days.  Pain score 9/10.  Pt was seen at [facility] x 3 days ago and diagnosed w/ uterine fibroids.  Pt sts she has a follow-up appointment this Friday.

## 2014-05-23 ENCOUNTER — Telehealth (HOSPITAL_BASED_OUTPATIENT_CLINIC_OR_DEPARTMENT_OTHER): Payer: Self-pay | Admitting: Emergency Medicine

## 2014-05-25 ENCOUNTER — Encounter (HOSPITAL_COMMUNITY): Payer: Self-pay

## 2014-05-25 ENCOUNTER — Inpatient Hospital Stay (HOSPITAL_COMMUNITY)
Admission: AD | Admit: 2014-05-25 | Discharge: 2014-05-25 | Disposition: A | Discharge status: Home / Self Care | Payer: Medicaid Other | Source: Ambulatory Visit | Attending: Obstetrics and Gynecology | Admitting: Obstetrics and Gynecology

## 2014-05-25 DIAGNOSIS — D259 Leiomyoma of uterus, unspecified: Secondary | ICD-10-CM | POA: Diagnosis not present

## 2014-05-25 DIAGNOSIS — R1030 Lower abdominal pain, unspecified: Secondary | ICD-10-CM | POA: Diagnosis present

## 2014-05-25 LAB — URINALYSIS, ROUTINE W REFLEX MICROSCOPIC
Bilirubin Urine: NEGATIVE
Glucose, UA: NEGATIVE mg/dL
HGB URINE DIPSTICK: NEGATIVE
KETONES UR: NEGATIVE mg/dL
Leukocytes, UA: NEGATIVE
Nitrite: NEGATIVE
PH: 5.5 (ref 5.0–8.0)
PROTEIN: NEGATIVE mg/dL
Specific Gravity, Urine: >1.03 — ABNORMAL HIGH (ref 1.005–1.030)
Urobilinogen, UA: 0.2 mg/dL (ref 0.0–1.0)

## 2014-05-25 LAB — POCT PREGNANCY, URINE: PREG TEST UR: NEGATIVE

## 2014-05-25 MED ORDER — OXYCODONE-ACETAMINOPHEN 7.5-325 MG PO TABS: 1.0000 | ORAL_TABLET | Freq: Four times a day (QID) | ORAL | Status: DC | PRN

## 2014-05-25 MED ORDER — HYDROCODONE-ACETAMINOPHEN 10-325 MG PO TABS: 2.0000 | ORAL_TABLET | Freq: Once | ORAL | Status: DC

## 2014-05-25 MED ORDER — IBUPROFEN 600 MG PO TABS
600.0000 mg | ORAL_TABLET | Freq: Once | ORAL | Status: AC
  Administered 2014-05-25: 600 mg via ORAL
  Filled 2014-05-25: qty 1

## 2014-05-25 MED ORDER — OXYCODONE-ACETAMINOPHEN 5-325 MG PO TABS
1.5000 | ORAL_TABLET | Freq: Four times a day (QID) | ORAL | Status: AC | PRN
  Administered 2014-05-25: 1.5 via ORAL
  Filled 2014-05-25: qty 2

## 2014-05-25 NOTE — MAU Note (Signed)
Lower left abd pain since last Friday night - then started having back pain the next morning.  Been going on a week now.  Had a work-up at [facility] on Last Saturday - was told has a fibroid.  Was told to f/u in [facility] but no appts until April.  Having period right now, reports as light.

## 2014-05-25 NOTE — Discharge Instructions (Signed)
Fibroids Fibroids are lumps (tumors) that can occur any place in a woman's body. These lumps are not cancerous. Fibroids vary in size, weight, and where they grow. HOME CARE  Do not take aspirin.  Write down the number of pads or tampons you use during your period. Tell your doctor. This can help determine the best treatment for you. GET HELP RIGHT AWAY IF:  You have pain in your lower belly (abdomen) that is not helped with medicine.  You have cramps that are not helped with medicine.  You have more bleeding between or during your period.  You feel lightheaded or pass out (faint).  Your lower belly pain gets worse. MAKE SURE YOU:  Understand these instructions.  Will watch your condition.  Will get help right away if you are not doing well or get worse. Document Released: 03/28/2010 Document Revised: 05/18/2011 Document Reviewed: 03/28/2010 Exit[employer] Patient Information 2015 [employer]. This information is not intended to replace advice given to you by your health care provider. Make sure you discuss any questions you have with your health care provider.  

## 2014-05-30 ENCOUNTER — Encounter: Payer: Self-pay | Admitting: Obstetrics & Gynecology

## 2014-05-31 ENCOUNTER — Other Ambulatory Visit (HOSPITAL_COMMUNITY): Payer: Self-pay | Admitting: Obstetrics and Gynecology

## 2014-05-31 ENCOUNTER — Ambulatory Visit (HOSPITAL_COMMUNITY)
Admission: RE | Admit: 2014-05-31 | Discharge: 2014-05-31 | Disposition: A | Discharge status: Home / Self Care | Payer: Medicaid Other | Source: Ambulatory Visit | Attending: Obstetrics and Gynecology | Admitting: Obstetrics and Gynecology

## 2014-05-31 DIAGNOSIS — D259 Leiomyoma of uterus, unspecified: Secondary | ICD-10-CM | POA: Diagnosis not present

## 2014-05-31 DIAGNOSIS — R102 Pelvic and perineal pain: Secondary | ICD-10-CM | POA: Insufficient documentation

## 2014-07-02 ENCOUNTER — Other Ambulatory Visit (HOSPITAL_COMMUNITY)
Admission: RE | Admit: 2014-07-02 | Discharge: 2014-07-02 | Disposition: A | Discharge status: Home / Self Care | Payer: Medicaid Other | Source: Ambulatory Visit | Attending: Obstetrics & Gynecology | Admitting: Obstetrics & Gynecology

## 2014-07-02 ENCOUNTER — Encounter: Payer: Self-pay | Admitting: Obstetrics & Gynecology

## 2014-07-02 ENCOUNTER — Ambulatory Visit (INDEPENDENT_AMBULATORY_CARE_PROVIDER_SITE_OTHER): Payer: Medicaid Other | Admitting: Obstetrics & Gynecology

## 2014-07-02 VITALS — BP 162/100 | HR 89 | Temp 98.7°F | Ht 66.5 in | Wt 359.1 lb

## 2014-07-02 DIAGNOSIS — R102 Pelvic and perineal pain: Secondary | ICD-10-CM

## 2014-07-02 DIAGNOSIS — Z01419 Encounter for gynecological examination (general) (routine) without abnormal findings: Secondary | ICD-10-CM

## 2014-07-02 DIAGNOSIS — Z1151 Encounter for screening for human papillomavirus (HPV): Secondary | ICD-10-CM | POA: Insufficient documentation

## 2014-07-02 DIAGNOSIS — K5730 Diverticulosis of large intestine without perforation or abscess without bleeding: Secondary | ICD-10-CM

## 2014-07-02 DIAGNOSIS — Z124 Encounter for screening for malignant neoplasm of cervix: Secondary | ICD-10-CM | POA: Diagnosis not present

## 2014-07-02 DIAGNOSIS — D251 Intramural leiomyoma of uterus: Secondary | ICD-10-CM

## 2014-07-02 DIAGNOSIS — K5790 Diverticulosis of intestine, part unspecified, without perforation or abscess without bleeding: Secondary | ICD-10-CM | POA: Insufficient documentation

## 2014-07-02 NOTE — Progress Notes (Signed)
   Subjective:    Patient ID: Linda Sheppard, female    DOB: 1975/04/05, 39 y.o.   MRN: [id number]  HPI  This pleasant 39 yo S AA P1 is here today to discuss surgery to help with her degenerating fibroid and pelvic pain. She has had a BTL and does not want more kids.   Review of Systems She says that it has been since 2014 when she last used cocaine. She is starting a new job in May Her pap smear is due.    Objective:   Physical Exam  Morbidly obese WHBFNAD Abd- benign, obese Cervix-nulliparous (cesarean delivery)      Assessment & Plan:  Symptomatic degenerating fibroid- We discussed options and after a long discussion, she opts for a supracervical hysterectomy, bilateral salpingectomy I did a pap smear today. I counseled her to come early for a UDS on the day of surgery and to remember to take her BP medication.

## 2014-07-02 NOTE — Progress Notes (Signed)
Patient ID: Linda Sheppard, female   DOB: 1976-02-16, 39 y.o.   MRN: [id number] Pt has not taken blood pressure medication for the past 2 days.

## 2014-07-03 LAB — CYTOLOGY - PAP

## 2014-07-05 ENCOUNTER — Telehealth: Payer: Self-pay | Admitting: General Practice

## 2014-07-05 NOTE — Telephone Encounter (Signed)
Patient called and left message stating she was seen here Monday for a pap smear and wants to know if her results are back yet. Called patient and informed her of normal results. Patient verbalized understanding and had no other questions

## 2014-07-27 ENCOUNTER — Encounter (HOSPITAL_COMMUNITY)
Admission: RE | Admit: 2014-07-27 | Discharge: 2014-07-27 | Disposition: A | Discharge status: Home / Self Care | Payer: Medicaid Other | Source: Ambulatory Visit | Attending: Obstetrics & Gynecology | Admitting: Obstetrics & Gynecology

## 2014-07-27 ENCOUNTER — Encounter (HOSPITAL_COMMUNITY): Payer: Self-pay

## 2014-07-27 ENCOUNTER — Other Ambulatory Visit: Payer: Self-pay

## 2014-07-27 DIAGNOSIS — Z01812 Encounter for preprocedural laboratory examination: Secondary | ICD-10-CM | POA: Diagnosis present

## 2014-07-27 LAB — BASIC METABOLIC PANEL
Anion gap: 10 (ref 5–15)
BUN: 11 mg/dL (ref 6–20)
CALCIUM: 9.3 mg/dL (ref 8.9–10.3)
CO2: 28 mmol/L (ref 22–32)
CREATININE: 0.86 mg/dL (ref 0.44–1.00)
Chloride: 96 mmol/L — ABNORMAL LOW (ref 101–111)
GFR calc Af Amer: >60 mL/min (ref >60)
GFR calc non Af Amer: >60 mL/min (ref >60)
GLUCOSE: 90 mg/dL (ref 65–99)
POTASSIUM: 3.6 mmol/L (ref 3.5–5.1)
Sodium: 134 mmol/L — ABNORMAL LOW (ref 135–145)

## 2014-07-27 LAB — CBC
HCT: 36.2 % (ref 36.0–46.0)
Hemoglobin: 12.4 g/dL (ref 12.0–15.0)
MCH: 25.4 pg — AB (ref 26.0–34.0)
MCHC: 34.3 g/dL (ref 30.0–36.0)
MCV: 74.2 fL — ABNORMAL LOW (ref 78.0–100.0)
PLATELETS: 292 10*3/uL (ref 150–400)
RBC: 4.88 MIL/uL (ref 3.87–5.11)
RDW: 15.2 % (ref 11.5–15.5)
WBC: 8.8 10*3/uL (ref 4.0–10.5)

## 2014-07-27 NOTE — Patient Instructions (Addendum)
   Your procedure is scheduled on: MAY 24 AT 10AM  Enter through the Main Entrance of [facility] at:830AM  Pick up the phone at the desk and dial [phone] and inform us of your arrival.  Please call this number if you have any problems the morning of surgery: [phone]  Remember:    DO NOT EAT OR DRINK LIQUIDS AFTER MIDNIGHT MAY 23 (MONDAY)  Take these medicines the morning of surgery with a SIP OF WATER: TAKE BLOOD PRESSURE MEDICATIONS DAY OF SURGERY  Do not wear jewelry, make-up, or FINGER nail polish No metal in your hair or on your body. Do not wear lotions, powders, perfumes.  You may wear deodorant.  Do not bring valuables to the hospital. Contacts, dentures or bridgework may not be worn into surgery.  Leave suitcase in the car. After Surgery it may be brought to your room. For patients being admitted to the hospital, checkout time is 11:00am the day of discharge.    Patients discharged on the day of surgery will not be allowed to drive home.

## 2014-07-27 NOTE — Progress Notes (Signed)
EKG ok per Dr. Ermalene Postin

## 2014-07-30 ENCOUNTER — Encounter (HOSPITAL_COMMUNITY): Payer: Self-pay | Admitting: Anesthesiology

## 2014-07-30 NOTE — Anesthesia Preprocedure Evaluation (Addendum)
Anesthesia Evaluation  Patient identified by MRN, date of birth, ID band Patient awake    Reviewed: Allergy & Precautions, NPO status , Patient's Chart, lab work & pertinent test results, reviewed documented beta blocker date and time   Airway Mallampati: II  TM Distance: >3 FB Neck ROM: Full    Dental no notable dental hx. (+) Teeth Intact   Pulmonary former smoker,  breath sounds clear to auscultation  Pulmonary exam normal       Cardiovascular hypertension, Pt. on medications Normal cardiovascular examRhythm:Regular Rate:Normal     Neuro/Psych PSYCHIATRIC DISORDERS Anxiety Depression Bipolar Disorder Schizophrenia    GI/Hepatic (+)     substance abuse  cocaine use,   Endo/Other  Morbid obesity  Renal/GU negative Renal ROS  negative genitourinary   Musculoskeletal negative musculoskeletal ROS (+)   Abdominal (+) + obese,   Peds  Hematology   Anesthesia Other Findings   Reproductive/Obstetrics Pelvic pain Degenerating uterine fibroid                            Anesthesia Physical Anesthesia Plan  ASA: III  Anesthesia Plan: General   Post-op Pain Management:    Induction: Intravenous  Airway Management Planned: Oral ETT  Additional Equipment:   Intra-op Plan:   Post-operative Plan: Extubation in OR  Informed Consent: I have reviewed the patients History and Physical, chart, labs and discussed the procedure including the risks, benefits and alternatives for the proposed anesthesia with the patient or authorized representative who has indicated his/her understanding and acceptance.   Dental advisory given  Plan Discussed with: CRNA, Anesthesiologist and Surgeon  Anesthesia Plan Comments:         Anesthesia Quick Evaluation

## 2014-07-31 ENCOUNTER — Inpatient Hospital Stay (HOSPITAL_COMMUNITY)
Admission: RE | Admit: 2014-07-31 | Discharge: 2014-08-02 | DRG: 742 | Disposition: A | Discharge status: Home / Self Care | Payer: Medicaid Other | Source: Ambulatory Visit | Attending: Obstetrics & Gynecology | Admitting: Obstetrics & Gynecology

## 2014-07-31 ENCOUNTER — Encounter (HOSPITAL_COMMUNITY): Payer: Self-pay | Admitting: Anesthesiology

## 2014-07-31 ENCOUNTER — Inpatient Hospital Stay (HOSPITAL_COMMUNITY): Payer: Medicaid Other | Admitting: Anesthesiology

## 2014-07-31 ENCOUNTER — Encounter (HOSPITAL_COMMUNITY)
Admission: RE | Disposition: A | Discharge status: Home / Self Care | Payer: Self-pay | Source: Ambulatory Visit | Attending: Obstetrics & Gynecology

## 2014-07-31 DIAGNOSIS — D259 Leiomyoma of uterus, unspecified: Secondary | ICD-10-CM | POA: Diagnosis present

## 2014-07-31 DIAGNOSIS — R102 Pelvic and perineal pain: Secondary | ICD-10-CM | POA: Diagnosis present

## 2014-07-31 DIAGNOSIS — F209 Schizophrenia, unspecified: Secondary | ICD-10-CM | POA: Diagnosis present

## 2014-07-31 DIAGNOSIS — Z79899 Other long term (current) drug therapy: Secondary | ICD-10-CM

## 2014-07-31 DIAGNOSIS — F319 Bipolar disorder, unspecified: Secondary | ICD-10-CM | POA: Diagnosis present

## 2014-07-31 DIAGNOSIS — Z87891 Personal history of nicotine dependence: Secondary | ICD-10-CM

## 2014-07-31 DIAGNOSIS — F419 Anxiety disorder, unspecified: Secondary | ICD-10-CM | POA: Diagnosis present

## 2014-07-31 DIAGNOSIS — G8929 Other chronic pain: Secondary | ICD-10-CM | POA: Diagnosis present

## 2014-07-31 DIAGNOSIS — Z9889 Other specified postprocedural states: Secondary | ICD-10-CM

## 2014-07-31 DIAGNOSIS — Z6841 Body Mass Index (BMI) 40.0 and over, adult: Secondary | ICD-10-CM | POA: Diagnosis not present

## 2014-07-31 DIAGNOSIS — I10 Essential (primary) hypertension: Secondary | ICD-10-CM | POA: Diagnosis present

## 2014-07-31 HISTORY — PX: SUPRACERVICAL ABDOMINAL HYSTERECTOMY: SHX5393

## 2014-07-31 LAB — TYPE AND SCREEN
ABO/RH(D): O NEG
Antibody Screen: NEGATIVE

## 2014-07-31 LAB — ABO/RH: ABO/RH(D): O NEG

## 2014-07-31 LAB — PREGNANCY, URINE: Preg Test, Ur: NEGATIVE

## 2014-07-31 SURGERY — HYSTERECTOMY, SUPRACERVICAL, ABDOMINAL
Anesthesia: General | Site: Abdomen

## 2014-07-31 MED ORDER — ONDANSETRON HCL 4 MG/2ML IJ SOLN
4.0000 mg | Freq: Four times a day (QID) | INTRAMUSCULAR | Status: DC | PRN
  Administered 2014-08-01: 4 mg via INTRAVENOUS
  Filled 2014-07-31: qty 2

## 2014-07-31 MED ORDER — PHENYLEPHRINE 40 MCG/ML (10ML) SYRINGE FOR IV PUSH (FOR BLOOD PRESSURE SUPPORT)
PREFILLED_SYRINGE | INTRAVENOUS | Status: AC
Start: 2014-07-31 — End: 2014-07-31
  Filled 2014-07-31: qty 10

## 2014-07-31 MED ORDER — HYDROMORPHONE HCL 1 MG/ML IJ SOLN
INTRAMUSCULAR | Status: DC | PRN
  Administered 2014-07-31: 1 mg via INTRAVENOUS

## 2014-07-31 MED ORDER — HYDROMORPHONE HCL 1 MG/ML IJ SOLN
INTRAMUSCULAR | Status: AC
  Filled 2014-07-31: qty 1

## 2014-07-31 MED ORDER — GLYCOPYRROLATE 0.2 MG/ML IJ SOLN
INTRAMUSCULAR | Status: AC
  Filled 2014-07-31: qty 3

## 2014-07-31 MED ORDER — LACTATED RINGERS IV SOLN
INTRAVENOUS | Status: DC
  Administered 2014-07-31 (×4): via INTRAVENOUS

## 2014-07-31 MED ORDER — GLYCOPYRROLATE 0.2 MG/ML IJ SOLN
INTRAMUSCULAR | Status: DC | PRN
  Administered 2014-07-31: 0.6 mg via INTRAVENOUS

## 2014-07-31 MED ORDER — OXYCODONE-ACETAMINOPHEN 5-325 MG PO TABS
1.0000 | ORAL_TABLET | ORAL | Status: DC | PRN
  Administered 2014-08-01 – 2014-08-02 (×6): 2 via ORAL
  Filled 2014-07-31 (×3): qty 2
  Filled 2014-07-31: qty 1
  Filled 2014-07-31 (×3): qty 2

## 2014-07-31 MED ORDER — 0.9 % SODIUM CHLORIDE (POUR BTL) OPTIME
TOPICAL | Status: DC | PRN
  Administered 2014-07-31 (×3): 1000 mL

## 2014-07-31 MED ORDER — SUCCINYLCHOLINE CHLORIDE 20 MG/ML IJ SOLN
INTRAMUSCULAR | Status: AC
  Filled 2014-07-31: qty 10

## 2014-07-31 MED ORDER — BUPIVACAINE HCL (PF) 0.5 % IJ SOLN
INTRAMUSCULAR | Status: DC | PRN
  Administered 2014-07-31: 30 mL

## 2014-07-31 MED ORDER — AMLODIPINE BESYLATE 10 MG PO TABS
10.0000 mg | ORAL_TABLET | Freq: Every day | ORAL | Status: DC
  Administered 2014-08-01: 10 mg via ORAL
  Filled 2014-07-31 (×3): qty 1

## 2014-07-31 MED ORDER — LISINOPRIL 10 MG PO TABS
10.0000 mg | ORAL_TABLET | Freq: Every day | ORAL | Status: DC
  Administered 2014-08-01: 10 mg via ORAL
  Filled 2014-07-31 (×3): qty 1

## 2014-07-31 MED ORDER — FENTANYL CITRATE (PF) 250 MCG/5ML IJ SOLN
INTRAMUSCULAR | Status: AC
  Filled 2014-07-31: qty 5

## 2014-07-31 MED ORDER — ACETAMINOPHEN 10 MG/ML IV SOLN
1000.0000 mg | Freq: Once | INTRAVENOUS | Status: AC
  Administered 2014-07-31: 1000 mg via INTRAVENOUS
  Filled 2014-07-31: qty 100

## 2014-07-31 MED ORDER — SCOPOLAMINE 1 MG/3DAYS TD PT72
1.0000 | MEDICATED_PATCH | Freq: Once | TRANSDERMAL | Status: DC
  Administered 2014-07-31: 1.5 mg via TRANSDERMAL

## 2014-07-31 MED ORDER — CLINDAMYCIN PHOSPHATE 900 MG/50ML IV SOLN
900.0000 mg | INTRAVENOUS | Status: AC
  Administered 2014-07-31: 900 mg via INTRAVENOUS
  Filled 2014-07-31: qty 50

## 2014-07-31 MED ORDER — BUPIVACAINE HCL (PF) 0.5 % IJ SOLN
INTRAMUSCULAR | Status: AC
  Filled 2014-07-31: qty 30

## 2014-07-31 MED ORDER — CEFAZOLIN SODIUM 10 G IJ SOLR: 3.0000 g | INTRAMUSCULAR | Status: DC

## 2014-07-31 MED ORDER — ROCURONIUM BROMIDE 100 MG/10ML IV SOLN
INTRAVENOUS | Status: DC | PRN
  Administered 2014-07-31: 40 mg via INTRAVENOUS
  Administered 2014-07-31: 10 mg via INTRAVENOUS
  Administered 2014-07-31: 5 mg via INTRAVENOUS
  Administered 2014-07-31: 10 mg via INTRAVENOUS
  Administered 2014-07-31: 5 mg via INTRAVENOUS

## 2014-07-31 MED ORDER — MIDAZOLAM HCL 2 MG/2ML IJ SOLN
INTRAMUSCULAR | Status: DC | PRN
  Administered 2014-07-31: 2 mg via INTRAVENOUS

## 2014-07-31 MED ORDER — LIDOCAINE HCL (CARDIAC) 20 MG/ML IV SOLN
INTRAVENOUS | Status: AC
  Filled 2014-07-31: qty 5

## 2014-07-31 MED ORDER — ENALAPRIL MALEATE 10 MG PO TABS: 10.0000 mg | ORAL_TABLET | Freq: Every day | ORAL | Status: DC

## 2014-07-31 MED ORDER — HYDROMORPHONE HCL 1 MG/ML IJ SOLN
0.2500 mg | INTRAMUSCULAR | Status: DC | PRN
  Administered 2014-07-31 (×3): 0.5 mg via INTRAVENOUS

## 2014-07-31 MED ORDER — NEOSTIGMINE METHYLSULFATE 10 MG/10ML IV SOLN
INTRAVENOUS | Status: DC | PRN
  Administered 2014-07-31: 3 mg via INTRAVENOUS

## 2014-07-31 MED ORDER — ZOLPIDEM TARTRATE 5 MG PO TABS
5.0000 mg | ORAL_TABLET | Freq: Every evening | ORAL | Status: DC | PRN
  Administered 2014-07-31: 5 mg via ORAL
  Filled 2014-07-31: qty 1

## 2014-07-31 MED ORDER — FLAVOXATE HCL 100 MG PO TABS
100.0000 mg | ORAL_TABLET | Freq: Three times a day (TID) | ORAL | Status: DC | PRN
  Administered 2014-07-31: 100 mg via ORAL
  Filled 2014-07-31 (×2): qty 1

## 2014-07-31 MED ORDER — HYDROMORPHONE HCL 1 MG/ML IJ SOLN
INTRAMUSCULAR | Status: AC
  Administered 2014-07-31: 0.5 mg via INTRAVENOUS
  Filled 2014-07-31: qty 1

## 2014-07-31 MED ORDER — SIMETHICONE 80 MG PO CHEW
80.0000 mg | CHEWABLE_TABLET | Freq: Four times a day (QID) | ORAL | Status: DC | PRN
  Administered 2014-07-31 – 2014-08-01 (×3): 80 mg via ORAL
  Filled 2014-07-31 (×3): qty 1

## 2014-07-31 MED ORDER — DEXAMETHASONE SODIUM PHOSPHATE 10 MG/ML IJ SOLN
INTRAMUSCULAR | Status: AC
  Filled 2014-07-31: qty 1

## 2014-07-31 MED ORDER — CIPROFLOXACIN IN D5W 400 MG/200ML IV SOLN
400.0000 mg | INTRAVENOUS | Status: AC
  Administered 2014-07-31: 400 mg via INTRAVENOUS
  Filled 2014-07-31: qty 200

## 2014-07-31 MED ORDER — ONDANSETRON HCL 4 MG PO TABS: 4.0000 mg | ORAL_TABLET | Freq: Four times a day (QID) | ORAL | Status: DC | PRN

## 2014-07-31 MED ORDER — MEPERIDINE HCL 25 MG/ML IJ SOLN: 6.2500 mg | INTRAMUSCULAR | Status: DC | PRN

## 2014-07-31 MED ORDER — HYDROMORPHONE HCL 1 MG/ML IJ SOLN
0.2000 mg | INTRAMUSCULAR | Status: DC | PRN
  Administered 2014-07-31 – 2014-08-01 (×5): 0.6 mg via INTRAVENOUS
  Filled 2014-07-31 (×5): qty 1

## 2014-07-31 MED ORDER — ONDANSETRON HCL 4 MG/2ML IJ SOLN
INTRAMUSCULAR | Status: DC | PRN
  Administered 2014-07-31: 4 mg via INTRAVENOUS

## 2014-07-31 MED ORDER — NEOSTIGMINE METHYLSULFATE 10 MG/10ML IV SOLN
INTRAVENOUS | Status: AC
  Filled 2014-07-31: qty 1

## 2014-07-31 MED ORDER — PHENYLEPHRINE HCL 10 MG/ML IJ SOLN
INTRAMUSCULAR | Status: DC | PRN
  Administered 2014-07-31 (×2): 40 ug via INTRAVENOUS
  Administered 2014-07-31: 80 ug via INTRAVENOUS

## 2014-07-31 MED ORDER — MIDAZOLAM HCL 2 MG/2ML IJ SOLN
INTRAMUSCULAR | Status: AC
  Filled 2014-07-31: qty 2

## 2014-07-31 MED ORDER — LACTATED RINGERS IV SOLN
INTRAVENOUS | Status: DC
  Administered 2014-07-31 – 2014-08-01 (×2): via INTRAVENOUS

## 2014-07-31 MED ORDER — SCOPOLAMINE 1 MG/3DAYS TD PT72
MEDICATED_PATCH | TRANSDERMAL | Status: AC
  Filled 2014-07-31: qty 1

## 2014-07-31 MED ORDER — HYDROCHLOROTHIAZIDE 12.5 MG PO CAPS
12.5000 mg | ORAL_CAPSULE | Freq: Every day | ORAL | Status: DC
  Filled 2014-07-31 (×2): qty 1

## 2014-07-31 MED ORDER — FENTANYL CITRATE (PF) 100 MCG/2ML IJ SOLN
INTRAMUSCULAR | Status: DC | PRN
  Administered 2014-07-31: 100 ug via INTRAVENOUS
  Administered 2014-07-31: 50 ug via INTRAVENOUS
  Administered 2014-07-31: 100 ug via INTRAVENOUS
  Administered 2014-07-31 (×4): 50 ug via INTRAVENOUS

## 2014-07-31 MED ORDER — METOCLOPRAMIDE HCL 5 MG/ML IJ SOLN: 10.0000 mg | Freq: Once | INTRAMUSCULAR | Status: DC | PRN

## 2014-07-31 MED ORDER — ONDANSETRON HCL 4 MG/2ML IJ SOLN
INTRAMUSCULAR | Status: AC
  Filled 2014-07-31: qty 2

## 2014-07-31 MED ORDER — DEXAMETHASONE SODIUM PHOSPHATE 10 MG/ML IJ SOLN
INTRAMUSCULAR | Status: DC | PRN
  Administered 2014-07-31: 8 mg via INTRAVENOUS

## 2014-07-31 MED ORDER — IBUPROFEN 800 MG PO TABS
800.0000 mg | ORAL_TABLET | Freq: Three times a day (TID) | ORAL | Status: DC | PRN
  Administered 2014-08-01 – 2014-08-02 (×3): 800 mg via ORAL
  Filled 2014-07-31 (×3): qty 1

## 2014-07-31 MED ORDER — ROCURONIUM BROMIDE 100 MG/10ML IV SOLN
INTRAVENOUS | Status: AC
  Filled 2014-07-31: qty 1

## 2014-07-31 MED ORDER — PROPOFOL 10 MG/ML IV BOLUS
INTRAVENOUS | Status: DC | PRN
  Administered 2014-07-31: 30 mg via INTRAVENOUS
  Administered 2014-07-31: 50 mg via INTRAVENOUS
  Administered 2014-07-31: 20 mg via INTRAVENOUS
  Administered 2014-07-31: 250 mg via INTRAVENOUS

## 2014-07-31 MED ORDER — PROPOFOL 10 MG/ML IV BOLUS
INTRAVENOUS | Status: AC
  Filled 2014-07-31: qty 40

## 2014-07-31 SURGICAL SUPPLY — 36 items
CANISTER SUCT 3000ML (MISCELLANEOUS) ×3 IMPLANT
CLOSURE WOUND 1/2 X4 (GAUZE/BANDAGES/DRESSINGS) ×1
CLOTH BEACON ORANGE TIMEOUT ST (SAFETY) ×3 IMPLANT
CONT PATH 16OZ SNAP LID 3702 (MISCELLANEOUS) ×3 IMPLANT
DRSG OPSITE POSTOP 4X10 (GAUZE/BANDAGES/DRESSINGS) ×3 IMPLANT
DRSG OPSITE POSTOP 4X12 (GAUZE/BANDAGES/DRESSINGS) ×3 IMPLANT
DURAPREP 26ML APPLICATOR (WOUND CARE) ×9 IMPLANT
GAUZE SPONGE 4X4 16PLY XRAY LF (GAUZE/BANDAGES/DRESSINGS) ×3 IMPLANT
GLOVE BIO SURGEON STRL SZ 6.5 (GLOVE) ×2 IMPLANT
GLOVE BIO SURGEONS STRL SZ 6.5 (GLOVE) ×1
GLOVE BIOGEL PI IND STRL 7.0 (GLOVE) ×2 IMPLANT
GLOVE BIOGEL PI INDICATOR 7.0 (GLOVE) ×4
GOWN STRL REUS W/TWL LRG LVL3 (GOWN DISPOSABLE) ×9 IMPLANT
NEEDLE SPNL 18GX3.5 QUINCKE PK (NEEDLE) ×3 IMPLANT
NS IRRIG 1000ML POUR BTL (IV SOLUTION) ×3 IMPLANT
PACK ABDOMINAL GYN (CUSTOM PROCEDURE TRAY) ×3 IMPLANT
PAD ABD 7.5X8 STRL (GAUZE/BANDAGES/DRESSINGS) ×3 IMPLANT
PAD OB MATERNITY 4.3X12.25 (PERSONAL CARE ITEMS) ×3 IMPLANT
PROTECTOR NERVE ULNAR (MISCELLANEOUS) ×3 IMPLANT
SPONGE GAUZE 4X4 12PLY STER LF (GAUZE/BANDAGES/DRESSINGS) ×3 IMPLANT
SPONGE LAP 18X18 X RAY DECT (DISPOSABLE) ×6 IMPLANT
SPONGE SURGIFOAM ABS GEL 12-7 (HEMOSTASIS) ×3 IMPLANT
STRIP CLOSURE SKIN 1/2X4 (GAUZE/BANDAGES/DRESSINGS) ×2 IMPLANT
SUT CHROMIC 3 0 SH 27 (SUTURE) IMPLANT
SUT PDS AB 0 CTX 60 (SUTURE) ×3 IMPLANT
SUT PLAIN 2 0 XLH (SUTURE) ×3 IMPLANT
SUT VIC AB 0 CT1 36 (SUTURE) ×9 IMPLANT
SUT VIC AB 0 CTX 36 (SUTURE) ×2
SUT VIC AB 0 CTX36XBRD ANBCTRL (SUTURE) ×1 IMPLANT
SUT VIC AB 2-0 CT1 18 (SUTURE) ×9 IMPLANT
SUT VIC AB 3-0 CT1 27 (SUTURE) ×2
SUT VIC AB 3-0 CT1 TAPERPNT 27 (SUTURE) ×1 IMPLANT
SYR 30ML LL (SYRINGE) ×3 IMPLANT
TOWEL OR 17X24 6PK STRL BLUE (TOWEL DISPOSABLE) ×6 IMPLANT
TRAY FOLEY CATH SILVER 14FR (SET/KITS/TRAYS/PACK) ×3 IMPLANT
WATER STERILE IRR 1000ML POUR (IV SOLUTION) ×3 IMPLANT

## 2014-07-31 NOTE — Anesthesia Procedure Notes (Addendum)
Procedure Name: Intubation Date/Time: 07/31/2014 9:58 AM Performed by: Georgeanne Nim Pre-anesthesia Checklist: Patient identified, Patient being monitored, Timeout performed, Emergency Drugs available and Suction available Patient Re-evaluated:Patient Re-evaluated prior to inductionOxygen Delivery Method: Circle system utilized Preoxygenation: Pre-oxygenation with 100% oxygen Intubation Type: IV induction Ventilation: Mask ventilation without difficulty Grade View: Grade II Tube type: Oral Number of attempts: 1 Placement Confirmation: ETT inserted through vocal cords under direct vision,  breath sounds checked- equal and bilateral,  positive ETCO2 and CO2 detector Secured at: 21 cm Tube secured with: Tape Dental Injury: Teeth and Oropharynx as per pre-operative assessment  Difficulty Due To: Difficulty was anticipated   Date/Time: 07/31/2014 9:58 AM Performed by: Everette Rank P Dental Injury: Teeth and Oropharynx as per pre-operative assessment

## 2014-07-31 NOTE — Transfer of Care (Signed)
Immediate Anesthesia Transfer of Care Note  Patient: Linda Sheppard  Procedure(s) Performed: Procedure(s) with comments: HYSTERECTOMY SUPRACERVICAL ABDOMINAL (N/A) - Requested 07/31/14 @ 10:00a with Nelwyn Salisbury first assist  Patient Location: PACU  Anesthesia Type:General  Level of Consciousness: awake, alert , oriented and patient cooperative  Airway & Oxygen Therapy: Patient Spontanous Breathing and Patient connected to nasal cannula oxygen  Post-op Assessment: Report given to RN and Post -op Vital signs reviewed and stable  Post vital signs: Reviewed and stable  Last Vitals:  Filed Vitals:   07/31/14 0830  BP: 158/93  Pulse: 97  Temp: 37 C  Resp: 20    Complications: No apparent anesthesia complications

## 2014-07-31 NOTE — Anesthesia Postprocedure Evaluation (Signed)
  Anesthesia Post-op Note  Patient: Linda Sheppard  Procedure(s) Performed: Procedure(s) with comments: HYSTERECTOMY SUPRACERVICAL ABDOMINAL (N/A) - Requested 07/31/14 @ 10:00a with Nelwyn Salisbury first assist  Patient Location: PACU  Anesthesia Type:General  Level of Consciousness: awake, alert  and oriented  Airway and Oxygen Therapy: Patient Spontanous Breathing  Post-op Pain: mild  Post-op Assessment: Post-op Vital signs reviewed, Patient's Cardiovascular Status Stable, Respiratory Function Stable, Patent Airway, No signs of Nausea or vomiting and Pain level controlled  Post-op Vital Signs: Reviewed and stable  Last Vitals:  Filed Vitals:   07/31/14 1215  BP: 139/79  Pulse: 97  Temp:   Resp: 20    Complications: No apparent anesthesia complications

## 2014-07-31 NOTE — Op Note (Signed)
07/31/2014  11:43 AM  PATIENT:  Linda Sheppard  39 y.o. female  PRE-OPERATIVE DIAGNOSIS:  cpt [phone] - Symptomatic degenerating fibroid with pelvic pain, morbid obesity  POST-OPERATIVE DIAGNOSIS:  cpt [id number] - Symptomatic degenerating fibroid with pelvic pain, morbid obesity  PROCEDURE:  SUPRACERVICAL HYSTERECTOMY, BILATERAL SALPINGECTOMY SURGEON:  Surgeon(s) and Role:    * Emily Filbert, MD - Primary    ASSISTANTSNelwyn Salisbury  ANESTHESIA:   general  EBL:  Total I/O In: 2000 [I.V.:2000] Out: 400 [Urine:200; Blood:200]  BLOOD ADMINISTERED:none  DRAINS: none   LOCAL MEDICATIONS USED:  MARCAINE     SPECIMEN:  Source of Specimen:  uterus (without cervix) and bilateral oviducts  DISPOSITION OF SPECIMEN:  PATHOLOGY  COUNTS:  YES  TOURNIQUET:  * No tourniquets in log *  DICTATION: .Dragon Dictation  PLAN OF CARE: Admit to inpatient   PATIENT DISPOSITION:  PACU - hemodynamically stable.   Delay start of Pharmacological VTE agent (>24hrs) due to surgical blood loss or risk of bleeding: not applicable     The risks, benefits, and alternatives of surgery were explained, understood, and accepted. Consents were signed. All questions were answered. She was taken to the operating room and general anesthesia was applied without complication. Her abdomen and vagina were prepped and draped in the usual sterile fashion. A Foley catheter was placed which drained clear urine throughout the case. A transverse incision was made approximately 2 cm above her symphysis pubis after injecting 30 mL of 0.5% marcaine in the subcutaneous tissue. This was at the same site as her Cesarean section. The incision was carried down through the subcutaneous tissue to the fascia. Bleeding encountered was cauterized with the Bovie. The fascia was scored the midline and the fascial incision was extended bilaterally. The pyramidalis muscles were separated in a transverse fashion using electrosurgical technique. The  rectus muscles were separated in a transverse fashion in the midline using electrosurgical technique. The inferior epigastric vessels were ligated with 2-0 vicryl. Hemostasis was maintained. The peritoneum was entered with hemostats and the peritoneal incision was extended bilaterally with the Bovie, taking care to avoid bowel and bladder. The patient was placed in Trendelenburg position and her bowel was packed out of the abdominal cavity. An Christoper Allegra self -retaining retractor was used.  Her uterus was enlarged and a large lower uterine segment fibroid was noted. I used a Jacobs clamp  to elevate the uterus out of the incision. I did a myomectomy in order to allow visualization to the bladder. Coker clamps were used to elevate the uterus. The round ligaments were identified clamped cut and ligated. The uteroovarian ligaments were clamped, cut, and ligated. There were a lot of dense bladder adhesions which required extensive dissection. Excellent hemostasis was noted. 2-0 Vicryl sutures used throughout this case unless otherwise specified. The uterine vessels were skeletonized, clamped, cut, and doubly ligated. A bladder flap was created anteriorly. I used a scapel to remove the uterus. I used the Bovie to cauterized the endocervical cells. I closed the cervical stub with a 0 vicryl suture. Excellent hemostasis was noted. The pelvis was irrigated with 1 L of warm normal saline. All pedicles were noted to be hemostatic. I placed a piece of gelfoam over the cervix, beneath the bladder. The ureters were noted to be functioning and of normal caliber. The sponges were removed from the pelvis. The rectus muscles were inspected and hemostasis was assured. The fascia was closed with a 0 PDS loop running nonlocking suture. The subcutaneous  tissue was irrigated, clean, dry. The very large subcutaenous tissue was closed with a 2-0 plain catgut running suture.  A subcuticular closure was done with 3-0 vicryl suture.  She tolerated the procedure well and was taken to the recovery room in stable condition. Her Foley catheter drained clear urine throughout.

## 2014-07-31 NOTE — H&P (Signed)
Linda Sheppard is an 39 y.o. female here today to have a SCH/BS due to her chronic pelvic pain due to a degenerating fibroid.   Patient's last menstrual period was 06/19/2014.    Past Medical History  Diagnosis Date  . Obesity   . Hypertension   . Polysubstance abuse   . Bipolar 1 disorder   . Schizophrenia   . Depression   . Anxiety     Past Surgical History  Procedure Laterality Date  . Tubal ligation    . Cesarean section      Family History  Problem Relation Age of Onset  . Hypertension Mother   . Osteoarthritis Mother   . Osteoarthritis Father     Social History:  reports that she quit smoking about 21 months ago. Her smoking use included Cigarettes. She smoked 0.15 packs per day. She does not have any smokeless tobacco history on file. She reports that she drinks alcohol. She reports that she uses illicit drugs (Cocaine).  Allergies:  Allergies  Allergen Reactions  . Tramadol Anaphylaxis  . Ceftin Itching and Swelling  . Meloxicam Itching and Swelling    Lip swelling.    No prescriptions prior to admission    ROS She has had a BTL. Last menstrual period 06/19/2014. Physical Exam Heart- rrr Lungs- CTAB Abd- benign, obese No results found for this or any previous visit (from the past 24 hour(s)).  No results found.  Assessment/Plan: Chronic pelvic pain-degenerating fibroid- After discussion about [facility] versus [facility], she opts to have her cervix left in situ. I will remove her oviducts to help prevent ovarian cancer.  She understands the risks of surgery, including, but not to infection, bleeding, DVTs, damage to bowel, bladder, ureters. She wishes to proceed.     Melvin Marmo C. 07/31/2014, 7:54 AM

## 2014-08-01 ENCOUNTER — Encounter (HOSPITAL_COMMUNITY): Payer: Self-pay | Admitting: Obstetrics & Gynecology

## 2014-08-01 LAB — CBC
HCT: 30.5 % — ABNORMAL LOW (ref 36.0–46.0)
HEMOGLOBIN: 10.4 g/dL — AB (ref 12.0–15.0)
MCH: 24.9 pg — ABNORMAL LOW (ref 26.0–34.0)
MCHC: 34.1 g/dL (ref 30.0–36.0)
MCV: 73 fL — ABNORMAL LOW (ref 78.0–100.0)
Platelets: 247 10*3/uL (ref 150–400)
RBC: 4.18 MIL/uL (ref 3.87–5.11)
RDW: 15.3 % (ref 11.5–15.5)
WBC: 15.4 10*3/uL — ABNORMAL HIGH (ref 4.0–10.5)

## 2014-08-01 MED ORDER — HYDROCHLOROTHIAZIDE 25 MG PO TABS
25.0000 mg | ORAL_TABLET | Freq: Every day | ORAL | Status: DC
  Administered 2014-08-01: 25 mg via ORAL
  Filled 2014-08-01: qty 1

## 2014-08-01 MED ORDER — BISACODYL 10 MG RE SUPP
10.0000 mg | Freq: Every day | RECTAL | Status: DC | PRN
  Administered 2014-08-01: 10 mg via RECTAL
  Filled 2014-08-01: qty 1

## 2014-08-01 MED ORDER — HYDRALAZINE HCL 20 MG/ML IJ SOLN
5.0000 mg | INTRAMUSCULAR | Status: DC | PRN
  Administered 2014-08-01: 5 mg via INTRAVENOUS
  Filled 2014-08-01: qty 1

## 2014-08-01 NOTE — Anesthesia Postprocedure Evaluation (Signed)
Anesthesia Post Note  Patient: Linda Sheppard  Procedure(s) Performed: Procedure(s) (LRB): HYSTERECTOMY SUPRACERVICAL ABDOMINAL (N/A)  Anesthesia type: General  Patient location: Mother/Baby  Post pain: Pain level controlled  Post assessment: Post-op Vital signs reviewed  Last Vitals:  Filed Vitals:   08/01/14 0625  BP: 160/97  Pulse: 91  Temp: 37.3 C  Resp:     Post vital signs: Reviewed  Level of consciousness: awake and alert   Complications: No apparent anesthesia complications

## 2014-08-01 NOTE — Progress Notes (Signed)
1 Day Post-Op Procedure(s) (LRB): HYSTERECTOMY SUPRACERVICAL ABDOMINAL (N/A)  Subjective: Patient reports tolerating PO, + flatus and no problems voiding.    Objective: I have reviewed patient's vital signs, intake and output, medications and labs.  General: alert Resp: clear to auscultation bilaterally Cardio: regular rate and rhythm, S1, S2 normal, no murmur, click, rub or gallop GI: soft, non-tender; bowel sounds normal; no masses,  no organomegaly  Dressing:c/d/i  Assessment: s/p Procedure(s) with comments: HYSTERECTOMY SUPRACERVICAL ABDOMINAL (N/A) - Requested 07/31/14 @ 10:00a with Nelwyn Salisbury first assist: progressing well  Plan: Continue current care  LOS: 1 day    Anesia Blackwell C. 08/01/2014, 1:41 PM

## 2014-08-01 NOTE — Addendum Note (Signed)
Addendum  created 08/01/14 3128 by Talbot Grumbling, CRNA   Modules edited: Notes Section   Notes Section:  File: [id number]

## 2014-08-02 MED ORDER — OXYCODONE-ACETAMINOPHEN 5-325 MG PO TABS: 1.0000 | ORAL_TABLET | ORAL | Status: DC | PRN

## 2014-08-02 MED ORDER — IBUPROFEN 800 MG PO TABS: 800.0000 mg | ORAL_TABLET | Freq: Three times a day (TID) | ORAL | Status: DC | PRN

## 2014-08-02 NOTE — Discharge Summary (Signed)
Physician Discharge Summary  Patient ID: Linda Sheppard MRN: [id number] DOB/AGE: Apr 12, 1975 39 y.o.  Admit date: 07/31/2014 Discharge date: 08/02/2014  Admission Diagnoses: symptomatic degenerating fibroid  Discharge Diagnoses: same Active Problems:   Post-operative state   Discharged Condition: good  Hospital Course: She underwent an uncomplicated supracervical hysterectomy and bilateral salpingectomy. Her post op course was entirely normal. By POD #1 she was ambulating, voiding, tolerating po and having flatus. By 0700 on POD #2 she was requesting discharge home.  Consults: None  Significant Diagnostic Studies: labs: post op hbg 10  Treatments: surgery: as above  Discharge Exam: Blood pressure 118/68, pulse 87, temperature 98.8 F (37.1 C), temperature source Oral, resp. rate 16, height 5\' 6"  (1.676 m), weight 161.481 kg (356 lb), last menstrual period 06/19/2014, SpO2 98 %. General appearance: alert Resp: clear to auscultation bilaterally Cardio: regular rate and rhythm, S1, S2 normal, no murmur, click, rub or gallop GI: soft, non-tender; bowel sounds normal; no masses,  no organomegaly Dressing - c/d/i  Disposition: 01-Home or Self Care     Medication List    TAKE these medications        amLODipine 10 MG tablet  Commonly known as:  NORVASC  Take 1 tablet (10 mg total) by mouth daily.     ibuprofen 800 MG tablet  Commonly known as:  ADVIL,MOTRIN  Take 1 tablet (800 mg total) by mouth every 8 (eight) hours as needed (mild pain).     lisinopril-hydrochlorothiazide 10-12.5 MG per tablet  Commonly known as:  PRINZIDE,ZESTORETIC  Take 1 tablet by mouth daily.     oxyCODONE-acetaminophen 5-325 MG per tablet  Commonly known as:  PERCOCET/ROXICET  Take 1-2 tablets by mouth every 4 (four) hours as needed for severe pain (moderate to severe pain (when tolerating fluids)).           Follow-up Information    Follow up with Miaa Latterell C., MD. Schedule an  appointment as soon as possible for a visit in 6 weeks.   Specialty:  Obstetrics and Gynecology   Contact information:   [place] [address] [phone]       Signed: Emily Filbert 08/02/2014, 7:22 AM

## 2014-08-02 NOTE — Progress Notes (Signed)
Pt discharged home with significant other, [place]... Discharge instructions reviewed with pt, and she verbalized understanding... RN instructed pt that she needed to remove pressure dressing today after showering, pt verbalized understanding... Condition stable... No equipment... Taken to car via wheelchair by Augustin Coupe, NT.

## 2014-08-02 NOTE — Discharge Instructions (Signed)
Abdominal Hysterectomy, Care After Refer to this sheet in the next few weeks. These instructions provide you with information on caring for yourself after your procedure. Your health care provider may also give you more specific instructions. Your treatment has been planned according to current medical practices, but problems sometimes occur. Call your health care provider if you have any problems or questions after your procedure.  WHAT TO EXPECT AFTER THE PROCEDURE After your procedure, it is typical to have the following:  Pain.  Feeling tired.  Poor appetite.  Less interest in sex. HOME CARE INSTRUCTIONS  It takes 4-6 weeks to recover from this surgery. Make sure you follow all your health care provider's instructions. Home care instructions may include:  Take pain medicines only as directed by your health care provider. Do not take over-the-counter pain medicines without checking with your health care provider first.  Change your bandage as directed by your health care provider.  Return to your health care provider to have your sutures taken out.  Take showers instead of baths for 2-3 weeks. Ask your health care provider when it is safe to start showering.  Do not douche, use tampons, or have sexual intercourse for at least 6 weeks or until your health care provider says you can.   Follow your health care provider's advice about exercise, lifting, driving, and general activities.  Get plenty of rest and sleep.   Do not lift anything heavier than a gallon of milk (about 10 lb [4.5 kg]) for the first month after surgery.  You can resume your normal diet if your health care provider says it is okay.   Do not drink alcohol until your health care provider says you can.   If you are constipated, ask your health care provider if you can take a mild laxative.  Eating foods high in fiber may also help with constipation. Eat plenty of raw fruits and vegetables, whole grains, and  beans.  Drink enough fluids to keep your urine clear or pale yellow.   Try to have someone at home with you for the first 1-2 weeks to help around the house.  Keep all follow-up appointments. SEEK MEDICAL CARE IF:   You have chills or fever.  You have swelling, redness, or pain in the area of your incision that is getting worse.   You have pus coming from the incision.   You notice a bad smell coming from the incision or bandage.   Your incision breaks open.   You feel dizzy or light-headed.   You have pain or bleeding when you urinate.   You have persistent diarrhea.   You have persistent nausea and vomiting.   You have abnormal vaginal discharge.   You have a rash.   You have any type of abnormal reaction or develop an allergy to your medicine.   Your pain medicine is not helping.  SEEK IMMEDIATE MEDICAL CARE IF:   You have a fever and your symptoms suddenly get worse.  You have severe abdominal pain.  You have chest pain.  You have shortness of breath.  You faint.  You have pain, swelling, or redness of your leg.  You have heavy vaginal bleeding with blood clots. MAKE SURE YOU:  Understand these instructions.  Will watch your condition.  Will get help right away if you are not doing well or get worse. Document Released: 09/12/2004 Document Revised: 02/28/2013 Document Reviewed: 12/16/2012 ExitCare Patient Information 2015 [employer]. This information is not intended   to replace advice given to you by your health care provider. Make sure you discuss any questions you have with your health care provider.  

## 2014-08-08 ENCOUNTER — Other Ambulatory Visit: Payer: Self-pay

## 2014-08-08 NOTE — Telephone Encounter (Addendum)
Pt called and stated that she had a partial hysterectomy last Tuesday and was discharged on Thursday; would like to have a refill on her pain medication.   Called pt and pt informed me that she is having a lot of gas and that she is having constipation.  Pt stated that she has taken stool softeners and it is not working.  I explained to the pt that taking the percocet could cause her to have constipation on top of the fact that she just had surgery.  I informed pt that per Dr. Hulan Fray she could get a refill on her ibuprofen to control the pain and that she is more than welcomed to schedule an appt to be evaluated for pain sooner than her post op appt.  I advised pt to continue to take the stool softeners and ambulate this will help with her symptoms.  Pt stated understanding with no further questions.

## 2014-08-11 ENCOUNTER — Inpatient Hospital Stay (HOSPITAL_COMMUNITY): Payer: Medicaid Other

## 2014-08-11 ENCOUNTER — Inpatient Hospital Stay (HOSPITAL_COMMUNITY)
Admission: AD | Admit: 2014-08-11 | Discharge: 2014-08-16 | DRG: 863 | Disposition: A | Discharge status: Home / Self Care | Payer: Medicaid Other | Source: Ambulatory Visit | Attending: Obstetrics and Gynecology | Admitting: Obstetrics and Gynecology

## 2014-08-11 ENCOUNTER — Encounter (HOSPITAL_COMMUNITY): Payer: Self-pay | Admitting: *Deleted

## 2014-08-11 DIAGNOSIS — T8149XA Infection following a procedure, other surgical site, initial encounter: Secondary | ICD-10-CM | POA: Diagnosis present

## 2014-08-11 DIAGNOSIS — I10 Essential (primary) hypertension: Secondary | ICD-10-CM | POA: Diagnosis present

## 2014-08-11 DIAGNOSIS — B951 Streptococcus, group B, as the cause of diseases classified elsewhere: Secondary | ICD-10-CM | POA: Diagnosis present

## 2014-08-11 DIAGNOSIS — Z6841 Body Mass Index (BMI) 40.0 and over, adult: Secondary | ICD-10-CM

## 2014-08-11 DIAGNOSIS — F319 Bipolar disorder, unspecified: Secondary | ICD-10-CM | POA: Diagnosis present

## 2014-08-11 DIAGNOSIS — L02211 Cutaneous abscess of abdominal wall: Secondary | ICD-10-CM | POA: Diagnosis present

## 2014-08-11 DIAGNOSIS — Z90711 Acquired absence of uterus with remaining cervical stump: Secondary | ICD-10-CM | POA: Diagnosis present

## 2014-08-11 DIAGNOSIS — Z888 Allergy status to other drugs, medicaments and biological substances status: Secondary | ICD-10-CM | POA: Diagnosis not present

## 2014-08-11 DIAGNOSIS — Z8249 Family history of ischemic heart disease and other diseases of the circulatory system: Secondary | ICD-10-CM

## 2014-08-11 DIAGNOSIS — Z881 Allergy status to other antibiotic agents status: Secondary | ICD-10-CM

## 2014-08-11 DIAGNOSIS — Z9079 Acquired absence of other genital organ(s): Secondary | ICD-10-CM | POA: Diagnosis present

## 2014-08-11 DIAGNOSIS — F419 Anxiety disorder, unspecified: Secondary | ICD-10-CM | POA: Diagnosis present

## 2014-08-11 DIAGNOSIS — T814XXA Infection following a procedure, initial encounter: Secondary | ICD-10-CM | POA: Diagnosis present

## 2014-08-11 DIAGNOSIS — G8918 Other acute postprocedural pain: Secondary | ICD-10-CM

## 2014-08-11 DIAGNOSIS — Z79899 Other long term (current) drug therapy: Secondary | ICD-10-CM | POA: Diagnosis not present

## 2014-08-11 DIAGNOSIS — E669 Obesity, unspecified: Secondary | ICD-10-CM | POA: Diagnosis present

## 2014-08-11 DIAGNOSIS — F209 Schizophrenia, unspecified: Secondary | ICD-10-CM | POA: Diagnosis present

## 2014-08-11 DIAGNOSIS — Z885 Allergy status to narcotic agent status: Secondary | ICD-10-CM

## 2014-08-11 DIAGNOSIS — Z87891 Personal history of nicotine dependence: Secondary | ICD-10-CM | POA: Diagnosis not present

## 2014-08-11 DIAGNOSIS — Y838 Other surgical procedures as the cause of abnormal reaction of the patient, or of later complication, without mention of misadventure at the time of the procedure: Secondary | ICD-10-CM | POA: Diagnosis present

## 2014-08-11 DIAGNOSIS — IMO0001 Reserved for inherently not codable concepts without codable children: Secondary | ICD-10-CM

## 2014-08-11 LAB — CBC WITH DIFFERENTIAL/PLATELET
Basophils Absolute: 0 10*3/uL (ref 0.0–0.1)
Basophils Relative: 0 % (ref 0–1)
EOS PCT: 1 % (ref 0–5)
Eosinophils Absolute: 0.1 10*3/uL (ref 0.0–0.7)
HEMATOCRIT: 28.2 % — AB (ref 36.0–46.0)
Hemoglobin: 9.5 g/dL — ABNORMAL LOW (ref 12.0–15.0)
LYMPHS ABS: 2.2 10*3/uL (ref 0.7–4.0)
Lymphocytes Relative: 14 % (ref 12–46)
MCH: 24.7 pg — ABNORMAL LOW (ref 26.0–34.0)
MCHC: 33.7 g/dL (ref 30.0–36.0)
MCV: 73.4 fL — ABNORMAL LOW (ref 78.0–100.0)
MONOS PCT: 7 % (ref 3–12)
Monocytes Absolute: 1 10*3/uL (ref 0.1–1.0)
NEUTROS ABS: 12 10*3/uL — AB (ref 1.7–7.7)
Neutrophils Relative %: 78 % — ABNORMAL HIGH (ref 43–77)
PLATELETS: 333 10*3/uL (ref 150–400)
RBC: 3.84 MIL/uL — ABNORMAL LOW (ref 3.87–5.11)
RDW: 15.1 % (ref 11.5–15.5)
WBC: 15.3 10*3/uL — AB (ref 4.0–10.5)

## 2014-08-11 LAB — URINALYSIS, ROUTINE W REFLEX MICROSCOPIC
BILIRUBIN URINE: NEGATIVE
Glucose, UA: NEGATIVE mg/dL
Hgb urine dipstick: NEGATIVE
KETONES UR: NEGATIVE mg/dL
LEUKOCYTES UA: NEGATIVE
NITRITE: NEGATIVE
PROTEIN: NEGATIVE mg/dL
Specific Gravity, Urine: 1.015 (ref 1.005–1.030)
Urobilinogen, UA: 0.2 mg/dL (ref 0.0–1.0)
pH: 5.5 (ref 5.0–8.0)

## 2014-08-11 MED ORDER — CLINDAMYCIN PHOSPHATE 900 MG/50ML IV SOLN
900.0000 mg | Freq: Three times a day (TID) | INTRAVENOUS | Status: DC
  Filled 2014-08-11: qty 50

## 2014-08-11 MED ORDER — SODIUM CHLORIDE 0.9 % IV SOLN
INTRAVENOUS | Status: DC
  Administered 2014-08-11 – 2014-08-15 (×7): via INTRAVENOUS

## 2014-08-11 MED ORDER — IOHEXOL 300 MG/ML  SOLN
50.0000 mL | Freq: Once | INTRAMUSCULAR | Status: AC | PRN
  Administered 2014-08-11: 50 mL via ORAL

## 2014-08-11 MED ORDER — LISINOPRIL 10 MG PO TABS
10.0000 mg | ORAL_TABLET | Freq: Every day | ORAL | Status: DC
  Administered 2014-08-12 – 2014-08-15 (×4): 10 mg via ORAL
  Filled 2014-08-11 (×5): qty 1

## 2014-08-11 MED ORDER — IOHEXOL 300 MG/ML  SOLN
100.0000 mL | Freq: Once | INTRAMUSCULAR | Status: AC | PRN
  Administered 2014-08-11: 100 mL via INTRAVENOUS

## 2014-08-11 MED ORDER — BISACODYL 10 MG RE SUPP: 10.0000 mg | Freq: Every day | RECTAL | Status: DC | PRN

## 2014-08-11 MED ORDER — HYDROCHLOROTHIAZIDE 12.5 MG PO CAPS
12.5000 mg | ORAL_CAPSULE | Freq: Every day | ORAL | Status: DC
  Administered 2014-08-12 – 2014-08-15 (×4): 12.5 mg via ORAL
  Filled 2014-08-11 (×5): qty 1

## 2014-08-11 MED ORDER — CLINDAMYCIN PHOSPHATE 900 MG/50ML IV SOLN
900.0000 mg | Freq: Three times a day (TID) | INTRAVENOUS | Status: DC
  Administered 2014-08-11 – 2014-08-14 (×8): 900 mg via INTRAVENOUS
  Filled 2014-08-11 (×10): qty 50

## 2014-08-11 MED ORDER — DOCUSATE SODIUM 100 MG PO CAPS
100.0000 mg | ORAL_CAPSULE | Freq: Two times a day (BID) | ORAL | Status: DC | PRN
  Administered 2014-08-13: 100 mg via ORAL
  Filled 2014-08-11: qty 1

## 2014-08-11 MED ORDER — PRENATAL MULTIVITAMIN CH
1.0000 | ORAL_TABLET | Freq: Every day | ORAL | Status: DC
  Administered 2014-08-12 – 2014-08-15 (×4): 1 via ORAL
  Filled 2014-08-11 (×4): qty 1

## 2014-08-11 MED ORDER — AMLODIPINE BESYLATE 10 MG PO TABS
10.0000 mg | ORAL_TABLET | Freq: Every day | ORAL | Status: DC
  Administered 2014-08-12 – 2014-08-15 (×4): 10 mg via ORAL
  Filled 2014-08-11 (×5): qty 1

## 2014-08-11 MED ORDER — ENOXAPARIN SODIUM 40 MG/0.4ML ~~LOC~~ SOLN
40.0000 mg | SUBCUTANEOUS | Status: DC
  Administered 2014-08-11: 40 mg via SUBCUTANEOUS
  Filled 2014-08-11 (×2): qty 0.4

## 2014-08-11 MED ORDER — HYDROMORPHONE HCL 1 MG/ML IJ SOLN
1.0000 mg | INTRAMUSCULAR | Status: DC | PRN
  Administered 2014-08-11 – 2014-08-13 (×7): 1 mg via INTRAVENOUS
  Filled 2014-08-11 (×7): qty 1

## 2014-08-11 MED ORDER — LISINOPRIL-HYDROCHLOROTHIAZIDE 10-12.5 MG PO TABS: 1.0000 | ORAL_TABLET | Freq: Every day | ORAL | Status: DC

## 2014-08-11 MED ORDER — HYDROMORPHONE HCL 1 MG/ML IJ SOLN
1.0000 mg | Freq: Once | INTRAMUSCULAR | Status: AC
  Administered 2014-08-11: 1 mg via INTRAMUSCULAR
  Filled 2014-08-11: qty 1

## 2014-08-11 MED ORDER — POLYETHYLENE GLYCOL 3350 17 G PO PACK
17.0000 g | PACK | Freq: Every day | ORAL | Status: DC
Start: 2014-08-12 — End: 2014-08-16
  Administered 2014-08-12 – 2014-08-15 (×4): 17 g via ORAL
  Filled 2014-08-11 (×5): qty 1

## 2014-08-11 MED ORDER — DEXTROSE 5 % IV SOLN
630.0000 mg | INTRAVENOUS | Status: DC
  Administered 2014-08-11 – 2014-08-13 (×3): 630 mg via INTRAVENOUS
  Filled 2014-08-11 (×4): qty 15.75

## 2014-08-11 NOTE — H&P (Signed)
Linda Redden, PA-C Physician Assistant Certified Cosign Needed  MA[facility] Provider Note 08/11/2014 6:16 PM     Expand All Collapse All    History   Admitted with fever to 100.9,, incision pain , Peau-d/'orange edema in pannus without major erythema , and INcison that is firm, with CT abdomen showing a 14 x 5.x 3.9  Fluid collection within the pannus. Mild leukocytosis present.,  NO Gi complaints, and CT negative for intra-abdominal processes.  CSN: [id number]  Arrival date and time: 08/11/14 1745  First Provider Initiated Contact with Patient 08/11/14 1816    Chief Complaint  Patient presents with  . Post-op Problem      HPI  [name] is a 39 y.o. [id number] who presents to MAU on POD #11 after supracervical hysterectomy and bilateral salpingectomy. She states that she has been having pain in the lower abdomen since her surgery, but it is worse today. She was taking Percocet and Ibuprofen that was prescribed at discharge, but ran out. She took Tylenol an hour prior to arrival. She denies N/V/D or constipation. She states difficulty changing positions. She denies fever.   OB History    Gravida Para Term Preterm AB TAB SAB Ectopic Multiple Living   5 3 1 2 1     2       Obstetric Comments   1- SIDS 1- Stillbirth 2 living children      Past Medical History  Diagnosis Date  . Obesity   . Hypertension   . Polysubstance abuse   . Bipolar 1 disorder   . Schizophrenia   . Depression   . Anxiety     Past Surgical History  Procedure Laterality Date  . Tubal ligation    . Cesarean section    . Supracervical abdominal hysterectomy N/A 07/31/2014    Procedure: HYSTERECTOMY SUPRACERVICAL ABDOMINAL; Surgeon: Emily Filbert, MD; Location: [facility]; Service: Gynecology; Laterality: N/A; Requested 07/31/14 @ 10:00a with Nelwyn Salisbury first assist    Family History  Problem Relation Age of  Onset  . Hypertension Mother   . Osteoarthritis Mother   . Osteoarthritis Father     History  Substance Use Topics  . Smoking status: Former Smoker -- 0.15 packs/day    Types: Cigarettes    Quit date: 10/18/2012  . Smokeless tobacco: Not on file  . Alcohol Use: Yes     Comment: 48 oz beer/sporadic    Allergies:  Allergies  Allergen Reactions  . Ceftin Anaphylaxis  . Meloxicam Anaphylaxis  . Tramadol Anaphylaxis    Prescriptions prior to admission  Medication Sig Dispense Refill Last Dose  . amLODipine (NORVASC) 10 MG tablet Take 1 tablet (10 mg total) by mouth daily. 30 tablet 0 08/11/2014 at Unknown time  . docusate sodium (COLACE) 100 MG capsule Take 100 mg by mouth 2 (two) times daily as needed for mild constipation.   Past Week at Unknown time  . lisinopril-hydrochlorothiazide (PRINZIDE,ZESTORETIC) 10-12.5 MG per tablet Take 1 tablet by mouth daily.   08/11/2014 at Unknown time  . simethicone (MYLICON) 80 MG chewable tablet Chew 160 mg by mouth every 6 (six) hours as needed for flatulence.   Past Week at Unknown time  . ibuprofen (ADVIL,MOTRIN) 800 MG tablet Take 1 tablet (800 mg total) by mouth every 8 (eight) hours as needed (mild pain). (Patient not taking: Reported on 08/11/2014) 30 tablet 0   . oxyCODONE-acetaminophen (PERCOCET/ROXICET) 5-325 MG per tablet Take 1-2 tablets by mouth every 4 (four) hours as needed  for severe pain (moderate to severe pain (when tolerating fluids)). (Patient not taking: Reported on 08/11/2014) 30 tablet 0     Review of Systems  Constitutional: Negative for fever and malaise/fatigue.  Gastrointestinal: Positive for abdominal pain. Negative for nausea, vomiting, diarrhea and constipation.   Physical Exam   Blood pressure 126/73, pulse 100, temperature 99.3 F (37.4 C), temperature source Oral, resp. rate 18, height 5\' 6"  (1.676 m), weight 362 lb 8 oz  (164.429 kg), last menstrual period 06/19/2014.  Physical Exam  Nursing note and vitals reviewed. Constitutional: She is oriented to person, place, and time. She appears well-developed and well-nourished. No distress.  HENT:  Head: Normocephalic and atraumatic.  Cardiovascular: Normal rate.  Respiratory: Effort normal.  GI: Soft. She exhibits no distension and no mass. There is tenderness (moderate tenderness to palpation of the lower abdomen just above incision). There is no rebound and no guarding.  Neurological: She is alert and oriented to person, place, and time.  Skin: Skin is warm and dry. No erythema.     Psychiatric: She has a normal mood and affect.    Lab Results Last 24 Hours    Results for orders placed or performed during the hospital encounter of 08/11/14 (from the past 24 hour(s))  Urinalysis, Routine w reflex microscopic (not at [facility]) Status: None   Collection Time: 08/11/14 5:53 PM  Result Value Ref Range   Color, Urine YELLOW YELLOW   APPearance CLEAR CLEAR   Specific Gravity, Urine 1.015 1.005 - 1.030   pH 5.5 5.0 - 8.0   Glucose, UA NEGATIVE NEGATIVE mg/dL   Hgb urine dipstick NEGATIVE NEGATIVE   Bilirubin Urine NEGATIVE NEGATIVE   Ketones, ur NEGATIVE NEGATIVE mg/dL   Protein, ur NEGATIVE NEGATIVE mg/dL   Urobilinogen, UA 0.2 0.0 - 1.0 mg/dL   Nitrite NEGATIVE NEGATIVE   Leukocytes, UA NEGATIVE NEGATIVE  CBC with Differential/Platelet Status: Abnormal   Collection Time: 08/11/14 6:35 PM  Result Value Ref Range   WBC 15.3 (H) 4.0 - 10.5 K/uL   RBC 3.84 (L) 3.87 - 5.11 MIL/uL   Hemoglobin 9.5 (L) 12.0 - 15.0 g/dL   HCT 28.2 (L) 36.0 - 46.0 %   MCV 73.4 (L) 78.0 - 100.0 fL   MCH 24.7 (L) 26.0 - 34.0 pg   MCHC 33.7 30.0 - 36.0 g/dL   RDW 15.1 11.5 - 15.5 %   Platelets 333 150 - 400 K/uL   Neutrophils Relative % 78 (H) 43 - 77 %   Neutro Abs  12.0 (H) 1.7 - 7.7 K/uL   Lymphocytes Relative 14 12 - 46 %   Lymphs Abs 2.2 0.7 - 4.0 K/uL   Monocytes Relative 7 3 - 12 %   Monocytes Absolute 1.0 0.1 - 1.0 K/uL   Eosinophils Relative 1 0 - 5 %   Eosinophils Absolute 0.1 0.0 - 0.7 K/uL   Basophils Relative 0 0 - 1 %   Basophils Absolute 0.0 0.0 - 0.1 K/uL      Imaging Results (Last 48 hours)    Ct Abdomen Pelvis W Contrast  08/11/2014 CLINICAL DATA: Elevated white blood count. Lower abdominal pain. Hysterectomy on 08/07/2014. EXAM: CT ABDOMEN AND PELVIS WITH CONTRAST TECHNIQUE: Multidetector CT imaging of the abdomen and pelvis was performed using the standard protocol following bolus administration of intravenous contrast. CONTRAST: 64mL OMNIPAQUE IOHEXOL 300 MG/ML SOLN, 133mL OMNIPAQUE IOHEXOL 300 MG/ML SOLN COMPARISON: CT scan dated 05/20/2014 FINDINGS: Minimal atelectasis at the lung bases posteriorly. Heart size is  normal. There is a fluid collection in the subcutaneous fat at the site of the incision measuring 14.6 x 5.4 x 3.9 cm with a few small air bubbles. This could represent a postoperative seroma or hematoma or an abscess. The liver, biliary tree, spleen, pancreas, adrenal glands, and kidneys are normal. The bowel is normal. Bladder and ovaries appear normal. No evidence of intra-abdominal abscess. Muscles of the anterior abdominal wall appear normal. No osseous abnormality. IMPRESSION: Fluid collection in the subcutaneous fat at the site of the incision for hysterectomy. This could represent a seroma or hematoma or abscess. No other significant finding. Electronically Signed By: Lorriane Shire M.D. On: 08/11/2014 20:51     MAU Course  Procedures None  MDM UA, CBC today Discussed patient presentation and VS with Dr. Glo Herring. He recommends CT scan of abdomen and pelvis to rule out any other pathology for fever and pain, CBC and IV Discussed CT scan results with Dr.  Glo Herring. He will come to MAU to discuss management with the patient. Plans to admit for IV antibiotics. Dr. Glo Herring discussed plan with patient  Assessment and Plan  A: Post operative pain and fever Seroma vs Hematoma vs Abscess  P: Admit to [facility]'s Unit for IV Antibiotics and possible aspiration of the fluid collection for diagnosis  Linda Redden, PA-C  08/11/2014 9:11 PM     Discussed plan with patient who is currently insisting on eating. Will visit pt on floor later for effort at aspiration of seroma/abscess.

## 2014-08-11 NOTE — MAU Provider Note (Signed)
History     CSN: [id number]  Arrival date and time: 08/11/14 1745   First Provider Initiated Contact with Patient 08/11/14 1816      Chief Complaint  Patient presents with  . Post-op Problem   HPI  [name] is a 39 y.o. [id number] who presents to MAU on POD #11 after supracervical hysterectomy and bilateral salpingectomy. She states that she has been having pain in the lower abdomen since her surgery, but it is worse today. She was taking Percocet and Ibuprofen that was prescribed at discharge, but ran out. She took Tylenol an hour prior to arrival. She denies N/V/D or constipation. She states difficulty changing positions. She denies fever.   OB History    Gravida Para Term Preterm AB TAB SAB Ectopic Multiple Living   5 3 1 2 1     2       Obstetric Comments   1- SIDS 1- Stillbirth 2 living children      Past Medical History  Diagnosis Date  . Obesity   . Hypertension   . Polysubstance abuse   . Bipolar 1 disorder   . Schizophrenia   . Depression   . Anxiety     Past Surgical History  Procedure Laterality Date  . Tubal ligation    . Cesarean section    . Supracervical abdominal hysterectomy N/A 07/31/2014    Procedure: HYSTERECTOMY SUPRACERVICAL ABDOMINAL;  Surgeon: Emily Filbert, MD;  Location: [facility];  Service: Gynecology;  Laterality: N/A;  Requested 07/31/14 @ 10:00a with Nelwyn Salisbury first assist    Family History  Problem Relation Age of Onset  . Hypertension Mother   . Osteoarthritis Mother   . Osteoarthritis Father     History  Substance Use Topics  . Smoking status: Former Smoker -- 0.15 packs/day    Types: Cigarettes    Quit date: 10/18/2012  . Smokeless tobacco: Not on file  . Alcohol Use: Yes     Comment: 48 oz beer/sporadic    Allergies:  Allergies  Allergen Reactions  . Ceftin Anaphylaxis  . Meloxicam Anaphylaxis  . Tramadol Anaphylaxis    Prescriptions prior to admission  Medication Sig Dispense Refill Last Dose  . amLODipine  (NORVASC) 10 MG tablet Take 1 tablet (10 mg total) by mouth daily. 30 tablet 0 08/11/2014 at Unknown time  . docusate sodium (COLACE) 100 MG capsule Take 100 mg by mouth 2 (two) times daily as needed for mild constipation.   Past Week at Unknown time  . lisinopril-hydrochlorothiazide (PRINZIDE,ZESTORETIC) 10-12.5 MG per tablet Take 1 tablet by mouth daily.   08/11/2014 at Unknown time  . simethicone (MYLICON) 80 MG chewable tablet Chew 160 mg by mouth every 6 (six) hours as needed for flatulence.   Past Week at Unknown time  . ibuprofen (ADVIL,MOTRIN) 800 MG tablet Take 1 tablet (800 mg total) by mouth every 8 (eight) hours as needed (mild pain). (Patient not taking: Reported on 08/11/2014) 30 tablet 0   . oxyCODONE-acetaminophen (PERCOCET/ROXICET) 5-325 MG per tablet Take 1-2 tablets by mouth every 4 (four) hours as needed for severe pain (moderate to severe pain (when tolerating fluids)). (Patient not taking: Reported on 08/11/2014) 30 tablet 0     Review of Systems  Constitutional: Negative for fever and malaise/fatigue.  Gastrointestinal: Positive for abdominal pain. Negative for nausea, vomiting, diarrhea and constipation.   Physical Exam   Blood pressure 126/73, pulse 100, temperature 99.3 F (37.4 C), temperature source Oral, resp. rate 18, height 5'  6" (1.676 m), weight 362 lb 8 oz (164.429 kg), last menstrual period 06/19/2014.  Physical Exam  Nursing note and vitals reviewed. Constitutional: She is oriented to person, place, and time. She appears well-developed and well-nourished. No distress.  HENT:  Head: Normocephalic and atraumatic.  Cardiovascular: Normal rate.   Respiratory: Effort normal.  GI: Soft. She exhibits no distension and no mass. There is tenderness (moderate tenderness to palpation of the lower abdomen just above incision). There is no rebound and no guarding.  Neurological: She is alert and oriented to person, place, and time.  Skin: Skin is warm and dry. No erythema.      Psychiatric: She has a normal mood and affect.   Results for orders placed or performed during the hospital encounter of 08/11/14 (from the past 24 hour(s))  Urinalysis, Routine w reflex microscopic (not at [facility])     Status: None   Collection Time: 08/11/14  5:53 PM  Result Value Ref Range   Color, Urine YELLOW YELLOW   APPearance CLEAR CLEAR   Specific Gravity, Urine 1.015 1.005 - 1.030   pH 5.5 5.0 - 8.0   Glucose, UA NEGATIVE NEGATIVE mg/dL   Hgb urine dipstick NEGATIVE NEGATIVE   Bilirubin Urine NEGATIVE NEGATIVE   Ketones, ur NEGATIVE NEGATIVE mg/dL   Protein, ur NEGATIVE NEGATIVE mg/dL   Urobilinogen, UA 0.2 0.0 - 1.0 mg/dL   Nitrite NEGATIVE NEGATIVE   Leukocytes, UA NEGATIVE NEGATIVE  CBC with Differential/Platelet     Status: Abnormal   Collection Time: 08/11/14  6:35 PM  Result Value Ref Range   WBC 15.3 (H) 4.0 - 10.5 K/uL   RBC 3.84 (L) 3.87 - 5.11 MIL/uL   Hemoglobin 9.5 (L) 12.0 - 15.0 g/dL   HCT 28.2 (L) 36.0 - 46.0 %   MCV 73.4 (L) 78.0 - 100.0 fL   MCH 24.7 (L) 26.0 - 34.0 pg   MCHC 33.7 30.0 - 36.0 g/dL   RDW 15.1 11.5 - 15.5 %   Platelets 333 150 - 400 K/uL   Neutrophils Relative % 78 (H) 43 - 77 %   Neutro Abs 12.0 (H) 1.7 - 7.7 K/uL   Lymphocytes Relative 14 12 - 46 %   Lymphs Abs 2.2 0.7 - 4.0 K/uL   Monocytes Relative 7 3 - 12 %   Monocytes Absolute 1.0 0.1 - 1.0 K/uL   Eosinophils Relative 1 0 - 5 %   Eosinophils Absolute 0.1 0.0 - 0.7 K/uL   Basophils Relative 0 0 - 1 %   Basophils Absolute 0.0 0.0 - 0.1 K/uL   Ct Abdomen Pelvis W Contrast  08/11/2014   CLINICAL DATA:  Elevated white blood count. Lower abdominal pain. Hysterectomy on 08/07/2014.  EXAM: CT ABDOMEN AND PELVIS WITH CONTRAST  TECHNIQUE: Multidetector CT imaging of the abdomen and pelvis was performed using the standard protocol following bolus administration of intravenous contrast.  CONTRAST:  59mL OMNIPAQUE IOHEXOL 300 MG/ML SOLN, 170mL OMNIPAQUE IOHEXOL 300 MG/ML SOLN   COMPARISON:  CT scan dated 05/20/2014  FINDINGS: Minimal atelectasis at the lung bases posteriorly. Heart size is normal.  There is a fluid collection in the subcutaneous fat at the site of the incision measuring 14.6 x 5.4 x 3.9 cm with a few small air bubbles. This could represent a postoperative seroma or hematoma or an abscess.  The liver, biliary tree, spleen, pancreas, adrenal glands, and kidneys are normal. The bowel is normal. Bladder and ovaries appear normal. No evidence of intra-abdominal abscess. Muscles  of the anterior abdominal wall appear normal. No osseous abnormality.  IMPRESSION: Fluid collection in the subcutaneous fat at the site of the incision for hysterectomy. This could represent a seroma or hematoma or abscess.  No other significant finding.   Electronically Signed   By: Lorriane Shire M.D.   On: 08/11/2014 20:51    MAU Course  Procedures None  MDM UA, CBC today Discussed patient presentation and VS with Dr. Glo Herring. He recommends CT scan of abdomen and pelvis to rule out any other pathology for fever and pain, CBC and IV Discussed CT scan results with Dr. Glo Herring. He will come to MAU to discuss management with the patient. Plans to admit for IV antibiotics. Dr. Glo Herring discussed plan with patient  Assessment and Plan  A: Post operative pain and fever Seroma vs Hematoma vs Abscess  P: Admit to Women's Unit for IV Antibiotics and possible aspiration of the fluid collection for diagnosis  Luvenia Redden, PA-C  08/11/2014 9:11 PM

## 2014-08-11 NOTE — Progress Notes (Signed)
ANTIBIOTIC CONSULT NOTE - INITIAL  Pharmacy Consult for Gentamicin Indication: Wound infection   Allergies  Allergen Reactions  . Ceftin Anaphylaxis  . Meloxicam Anaphylaxis  . Tramadol Anaphylaxis    Patient Measurements: Height: 5\' 6"  (167.6 cm) Weight: (!) 362 lb 8 oz (164.429 kg) IBW/kg (Calculated) : 59.3 Adjusted Body Weight: 91 kg  Vital Signs: Temp: 99.9 F (37.7 C) (06/04 2219) Temp Source: Oral (06/04 2219) BP: 142/87 mmHg (06/04 2219) Pulse Rate: 103 (06/04 2219) Intake/Output from previous day:   Intake/Output from this shift:    Labs:  Recent Labs  08/11/14 1835  WBC 15.3*  HGB 9.5*  PLT 333   Estimated Creatinine Clearance: 143.2 mL/min (by C-G formula based on Cr of 0.86). No results for input(s): VANCOTROUGH, VANCOPEAK, VANCORANDOM, GENTTROUGH, GENTPEAK, GENTRANDOM, TOBRATROUGH, TOBRAPEAK, TOBRARND, AMIKACINPEAK, AMIKACINTROU, AMIKACIN in the last 72 hours.   Microbiology: No results found for this or any previous visit (from the past 720 hour(s)).  Medical History: Past Medical History  Diagnosis Date  . Obesity   . Hypertension   . Polysubstance abuse   . Bipolar 1 disorder   . Schizophrenia   . Depression   . Anxiety     Medications:  Clindamycin 900 mg IV every 8 hours  Assessment: 39 yo obese [id number] s/p supracervical hysterectomy and bilateral salpingectomy 07/31/14; s/p hospital discharge 08/02/14. Pt admitted with abdominal pain, fever and elevated WBCs. CT of abdomen and pelvis shows fluid collection below incision site; r/o abscess.  Weight adjusted CrCl: 85 ml/min; Est gentamicin Ke: 0.254 Vd: 0.28 L/Kg  Goal of Therapy:  Gentamicin peak 20 mg/L; trough undetectable  Plan:  Gentamicin 7 mg/kg (adjusted body weight) every 24 hours Check serum level after 1st dose Monitor serum creatinine per protocol   Norberto Sorenson 08/11/2014,10:35 PM

## 2014-08-11 NOTE — MAU Note (Signed)
Report called to women's unit.  Patient to be admitted to room 304.

## 2014-08-11 NOTE — MAU Note (Signed)
Yesterday pt started feeling incision pain and lower abdominal pain.  Feels like something is pulling on the inside.  Took all of her percocet and ibuprofen in the first few days.  Pt having regular bowel movements, feels like she may have gas still.

## 2014-08-11 NOTE — MAU Note (Signed)
Spoke with Santiago Glad in [place] and notified patient ready for CT.  Waiting for call back on ETA for CT scan.

## 2014-08-12 DIAGNOSIS — Z6841 Body Mass Index (BMI) 40.0 and over, adult: Secondary | ICD-10-CM

## 2014-08-12 DIAGNOSIS — B951 Streptococcus, group B, as the cause of diseases classified elsewhere: Secondary | ICD-10-CM

## 2014-08-12 DIAGNOSIS — I10 Essential (primary) hypertension: Secondary | ICD-10-CM

## 2014-08-12 DIAGNOSIS — F209 Schizophrenia, unspecified: Secondary | ICD-10-CM

## 2014-08-12 DIAGNOSIS — T814XXA Infection following a procedure, initial encounter: Secondary | ICD-10-CM

## 2014-08-12 DIAGNOSIS — F419 Anxiety disorder, unspecified: Secondary | ICD-10-CM

## 2014-08-12 DIAGNOSIS — F319 Bipolar disorder, unspecified: Secondary | ICD-10-CM

## 2014-08-12 DIAGNOSIS — Z8249 Family history of ischemic heart disease and other diseases of the circulatory system: Secondary | ICD-10-CM

## 2014-08-12 DIAGNOSIS — L02211 Cutaneous abscess of abdominal wall: Secondary | ICD-10-CM

## 2014-08-12 LAB — CBC WITH DIFFERENTIAL/PLATELET
BASOS ABS: 0 10*3/uL (ref 0.0–0.1)
Basophils Relative: 0 % (ref 0–1)
EOS ABS: 0 10*3/uL (ref 0.0–0.7)
Eosinophils Relative: 0 % (ref 0–5)
HEMATOCRIT: 27.5 % — AB (ref 36.0–46.0)
HEMOGLOBIN: 9.3 g/dL — AB (ref 12.0–15.0)
LYMPHS PCT: 13 % (ref 12–46)
Lymphs Abs: 2.2 10*3/uL (ref 0.7–4.0)
MCH: 24.7 pg — AB (ref 26.0–34.0)
MCHC: 33.8 g/dL (ref 30.0–36.0)
MCV: 73.1 fL — AB (ref 78.0–100.0)
MONO ABS: 1.2 10*3/uL — AB (ref 0.1–1.0)
Monocytes Relative: 7 % (ref 3–12)
NEUTROS ABS: 13.6 10*3/uL — AB (ref 1.7–7.7)
Neutrophils Relative %: 80 % — ABNORMAL HIGH (ref 43–77)
Platelets: 315 10*3/uL (ref 150–400)
RBC: 3.76 MIL/uL — AB (ref 3.87–5.11)
RDW: 15 % (ref 11.5–15.5)
WBC: 17 10*3/uL — AB (ref 4.0–10.5)

## 2014-08-12 LAB — CREATININE, SERUM
CREATININE: 0.72 mg/dL (ref 0.44–1.00)
GFR calc non Af Amer: >60 mL/min (ref >60)

## 2014-08-12 LAB — GENTAMICIN LEVEL, RANDOM: Gentamicin Rm: 17.1 ug/mL

## 2014-08-12 LAB — SEDIMENTATION RATE: Sed Rate: 49 mm/hr — ABNORMAL HIGH (ref 0–22)

## 2014-08-12 MED ORDER — OXYCODONE-ACETAMINOPHEN 5-325 MG PO TABS
1.0000 | ORAL_TABLET | ORAL | Status: DC | PRN
  Administered 2014-08-12 – 2014-08-15 (×13): 2 via ORAL
  Filled 2014-08-12 (×13): qty 2

## 2014-08-12 MED ORDER — ENOXAPARIN SODIUM 60 MG/0.6ML ~~LOC~~ SOLN
50.0000 mg | SUBCUTANEOUS | Status: DC
  Administered 2014-08-12 – 2014-08-15 (×4): 50 mg via SUBCUTANEOUS
  Filled 2014-08-12 (×4): qty 0.6

## 2014-08-12 NOTE — Procedures (Signed)
Pt informed of need for aspiration of thickened area at incision, suspected hematoma , rule out abscess. Pt gives consent, then later signed. Abdomen prepped, procedure confirmed  Local anesthesia placed, and 18 ga needle used to aspirate 35+ cc of old bloody nonpurulent fluid from the left side of the incision, 1 cm superior to incision in left midclavicular line. No malodor, no suspicion of infection. Specimen sent for cultures and gram stain.

## 2014-08-12 NOTE — Progress Notes (Signed)
CRITICAL VALUE ALERT  Critical value received:  Linda Sheppard. Level= 17.1  Date of notification:   08/12/2014  Time of notification:  0055  Critical value read back  yes  Nurse who received alert: Waneta Martins RN  MD notified (1st page):  Dr. Glo Herring  Time of first page:  0055  MD notified (2nd page):  Time of second page:  Responding MD:  Dr. Glo Herring  Time MD responded:  [phone]

## 2014-08-12 NOTE — Progress Notes (Signed)
ANTIBIOTIC CONSULT NOTE - FOLLOW UP  Pharmacy Consult for Gentamicin Indication: Wound infection  Allergies  Allergen Reactions  . Ceftin Anaphylaxis  . Meloxicam Anaphylaxis  . Tramadol Anaphylaxis    Patient Measurements: Height: 5\' 6"  (167.6 cm) Weight: (!) 362 lb 8 oz (164.429 kg) IBW/kg (Calculated) : 59.3 Adjusted Body Weight: 91 kg   Vital Signs: Temp: 99.9 F (37.7 C) (06/04 2219) Temp Source: Oral (06/04 2219) BP: 142/87 mmHg (06/04 2219) Pulse Rate: 103 (06/04 2219) Intake/Output from previous day: 06/04 0701 - 06/05 0700 In: 360 [P.O.:360] Out: -  Intake/Output from this shift: Total I/O In: 360 [P.O.:360] Out: -   Labs:  Recent Labs  08/11/14 1835  WBC 15.3*  HGB 9.5*  PLT 333   Estimated Creatinine Clearance: 143.2 mL/min (by C-G formula based on Cr of 0.86).  Recent Labs  08/11/14 2325  GENTRANDOM 17.1*     Microbiology: No results found for this or any previous visit (from the past 720 hour(s)).  Anti-infectives    Start     Dose/Rate Route Frequency Ordered Stop   08/11/14 2300  gentamicin (GARAMYCIN) 630 mg in dextrose 5 % 50 mL IVPB     630 mg 131.5 mL/hr over 30 Minutes Intravenous Every 24 hours 08/11/14 2228     08/11/14 2200  clindamycin (CLEOCIN) IVPB 900 mg  Status:  Discontinued     900 mg 100 mL/hr over 30 Minutes Intravenous 3 times per day 08/11/14 1900 08/11/14 2214   08/11/14 1930  clindamycin (CLEOCIN) IVPB 900 mg     900 mg 100 mL/hr over 30 Minutes Intravenous 3 times per day 08/11/14 1918        Assessment: Gentamicin level 17.1 mg/L was drawn at 2325 in error, while gentamicin dose was infusing.  Goal of Therapy:  Gentamicin peak 20 mg/L; trough undetectable  Plan:  Gentamicin level to be drawn at 0900 6/5 to validate kinetic estimates Adjust dose as indicated.  Norberto Sorenson 08/12/2014,1:20 AM

## 2014-08-13 LAB — CBC WITH DIFFERENTIAL/PLATELET
Basophils Absolute: 0 10*3/uL (ref 0.0–0.1)
Basophils Relative: 0 % (ref 0–1)
Eosinophils Absolute: 0.1 10*3/uL (ref 0.0–0.7)
Eosinophils Relative: 0 % (ref 0–5)
HEMATOCRIT: 25.8 % — AB (ref 36.0–46.0)
Hemoglobin: 8.7 g/dL — ABNORMAL LOW (ref 12.0–15.0)
LYMPHS PCT: 13 % (ref 12–46)
Lymphs Abs: 1.9 10*3/uL (ref 0.7–4.0)
MCH: 24.5 pg — ABNORMAL LOW (ref 26.0–34.0)
MCHC: 33.7 g/dL (ref 30.0–36.0)
MCV: 72.7 fL — ABNORMAL LOW (ref 78.0–100.0)
MONOS PCT: 6 % (ref 3–12)
Monocytes Absolute: 0.9 10*3/uL (ref 0.1–1.0)
Neutro Abs: 11.9 10*3/uL — ABNORMAL HIGH (ref 1.7–7.7)
Neutrophils Relative %: 81 % — ABNORMAL HIGH (ref 43–77)
PLATELETS: 310 10*3/uL (ref 150–400)
RBC: 3.55 MIL/uL — ABNORMAL LOW (ref 3.87–5.11)
RDW: 14.8 % (ref 11.5–15.5)
WBC: 14.6 10*3/uL — AB (ref 4.0–10.5)

## 2014-08-13 LAB — GENTAMICIN LEVEL, TROUGH: Gentamicin Trough: 3.3 ug/mL (ref 0.5–2.0)

## 2014-08-13 NOTE — Progress Notes (Signed)
ANTIBIOTIC CONSULT NOTE - FOLLOW UP  Pharmacy Consult for gentamicin Indication: wound infection  Allergies  Allergen Reactions  . Ceftin Anaphylaxis  . Meloxicam Anaphylaxis  . Tramadol Anaphylaxis    Patient Measurements: Height: 5\' 6"  (167.6 cm) Weight: (!) 362 lb 8 oz (164.429 kg) IBW/kg (Calculated) : 59.3 Adjusted Body Weight: 91 kg  Vital Signs: Temp: 99.2 F (37.3 C) (06/06 0556) Temp Source: Oral (06/06 0556) BP: 109/80 mmHg (06/06 0556) Pulse Rate: 95 (06/06 0556) Intake/Output from previous day: 06/05 0701 - 06/06 0700 In: 3290.4 [P.O.:1830; I.V.:1460.4] Out: 1600 [Urine:1600]  Labs:  Recent Labs  08/11/14 1835 08/12/14 0827 08/13/14 0810  WBC 15.3* 17.0* 14.6*  HGB 9.5* 9.3* 8.7*  PLT 333 315 310  CREATININE  --  0.72  --    Estimated Creatinine Clearance: 154 mL/min (by C-G formula based on Cr of 0.72).  Recent Labs  08/11/14 2325 08/13/14 0810  GENTTROUGH  --  3.3*  GENTRANDOM 17.1*  --      Microbiology:  Blood culture x 2 on 6/4 at 1835 and 1840 - NGTD Body fluid culture x 1 on 6/5 at 0915 - NGTD   Medications: Clindamycin 900 mg IV q8h Gentamicin 630 mg (7 mg/kg AdjBW) IV q24h - doses received at 6/4 at 2257 and 6/5 at 2318   Assessment: Pt is a 39yo G5P2 s/p supracervical hysterectomy and bilateral salpingectomy on 5/24 with d/c on 5/26. Readmitted 6/4 with abdominal pain, fever, and leukocytosis. Pt initiated on clindamycin and gentamicin for wound infection/ro abscess. Gentamicin was dosed on extended interval. Gentamicin serum concentration was drawn during infusion due to error. Random level drawn this morning appropriately. Random level was 3.3 mg/L at 9 hours post-dose, verifying appropriateness of Q24 dosing.   Goal of Therapy:  Gentamicin peak 20 mg/L and trough undetectable  Plan:  Continue gentamicin 7 mg/kg (AdjBW) every 24 hours Will continue to monitor pt progress F/u culture results and I's and O's  Essie Lagunes,  Atalya Dano [place] 08/13/2014,9:50 AM

## 2014-08-13 NOTE — Progress Notes (Signed)
Subjective:still some pain Patient reports incisional pain, tolerating PO, + flatus and no problems voiding.    Objective: I have reviewed patient's vital signs, medications, labs and microbiology.  General: alert, cooperative and no distress GI: soft, non-tender; bowel sounds normal; no masses,  no organomegaly and incision: clean, dry and erythematous Extremities: extremities normal, atraumatic, no cyanosis or edema  CBC    Component Value Date/Time   WBC 17.0* 08/12/2014 0827   RBC 3.76* 08/12/2014 0827   HGB 9.3* 08/12/2014 0827   HCT 27.5* 08/12/2014 0827   PLT 315 08/12/2014 0827   MCV 73.1* 08/12/2014 0827   MCH 24.7* 08/12/2014 0827   MCHC 33.8 08/12/2014 0827   RDW 15.0 08/12/2014 0827   LYMPHSABS 2.2 08/12/2014 0827   MONOABS 1.2* 08/12/2014 0827   EOSABS 0.0 08/12/2014 0827   BASOSABS 0.0 08/12/2014 0827     Assessment/Plan: Possible wound abscess vs seroma s/p drainage by Dr Glo Herring, would want to cont IV abx due to high WBC   LOS: 2 days    ARNOLD,JAMES 08/13/2014, 7:19 AM

## 2014-08-14 LAB — CBC WITH DIFFERENTIAL/PLATELET
BASOS ABS: 0 10*3/uL (ref 0.0–0.1)
Basophils Relative: 0 % (ref 0–1)
Eosinophils Absolute: 0.2 10*3/uL (ref 0.0–0.7)
Eosinophils Relative: 1 % (ref 0–5)
HCT: 25.3 % — ABNORMAL LOW (ref 36.0–46.0)
Hemoglobin: 8.8 g/dL — ABNORMAL LOW (ref 12.0–15.0)
LYMPHS ABS: 1.2 10*3/uL (ref 0.7–4.0)
Lymphocytes Relative: 7 % — ABNORMAL LOW (ref 12–46)
MCH: 24.9 pg — AB (ref 26.0–34.0)
MCHC: 34.8 g/dL (ref 30.0–36.0)
MCV: 71.5 fL — ABNORMAL LOW (ref 78.0–100.0)
MONO ABS: 1.4 10*3/uL — AB (ref 0.1–1.0)
Monocytes Relative: 8 % (ref 3–12)
NEUTROS ABS: 14.3 10*3/uL — AB (ref 1.7–7.7)
Neutrophils Relative %: 84 % — ABNORMAL HIGH (ref 43–77)
PLATELETS: 327 10*3/uL (ref 150–400)
RBC: 3.54 MIL/uL — ABNORMAL LOW (ref 3.87–5.11)
RDW: 14.6 % (ref 11.5–15.5)
WBC: 17.1 10*3/uL — AB (ref 4.0–10.5)

## 2014-08-14 MED ORDER — VANCOMYCIN HCL 10 G IV SOLR
1500.0000 mg | Freq: Three times a day (TID) | INTRAVENOUS | Status: DC
  Administered 2014-08-14 – 2014-08-16 (×5): 1500 mg via INTRAVENOUS
  Filled 2014-08-14 (×6): qty 1500

## 2014-08-14 MED ORDER — VANCOMYCIN HCL 10 G IV SOLR
2500.0000 mg | Freq: Once | INTRAVENOUS | Status: AC
  Administered 2014-08-14: 2500 mg via INTRAVENOUS
  Filled 2014-08-14: qty 2500

## 2014-08-14 MED ORDER — SODIUM CHLORIDE 0.9 % IV SOLN
2.0000 g | Freq: Four times a day (QID) | INTRAVENOUS | Status: DC
  Filled 2014-08-14: qty 2000

## 2014-08-14 MED ORDER — FAMOTIDINE 20 MG PO TABS
20.0000 mg | ORAL_TABLET | Freq: Two times a day (BID) | ORAL | Status: DC | PRN
  Administered 2014-08-14: 20 mg via ORAL
  Filled 2014-08-14: qty 1

## 2014-08-14 MED ORDER — IBUPROFEN 600 MG PO TABS
600.0000 mg | ORAL_TABLET | Freq: Four times a day (QID) | ORAL | Status: DC | PRN
  Administered 2014-08-14 – 2014-08-16 (×6): 600 mg via ORAL
  Filled 2014-08-14 (×7): qty 1

## 2014-08-14 NOTE — Progress Notes (Signed)
*   No surgery found *  Subjective: Patient reports incisional pain.  She is still anxious to go home.  Objective: I have reviewed patient's vital signs, intake and output, medications, labs and microbiology.  General: alert Resp: clear to auscultation bilaterally Cardio: regular rate and rhythm, S1, S2 normal, no murmur, click, rub or gallop GI: soft, non-tender; bowel sounds normal; no masses,  no organomegaly  Assessment: s/p [facility] POD#14 stable  The lab called to say that her wound fluid grew GBS. I will treat with vanc and delay her discharge until tomorrow.   Plan: add vanc  LOS: 3 days    Briannon Boggio C. 08/14/2014, 10:43 AM

## 2014-08-14 NOTE — Progress Notes (Signed)
ANTIBIOTIC CONSULT NOTE - INITIAL  Pharmacy Consult for vancomycin Indication: wound infection/cellulitis  Allergies  Allergen Reactions  . Ceftin Anaphylaxis  . Meloxicam Anaphylaxis  . Tramadol Anaphylaxis    Patient Measurements: Height: 5\' 6"  (167.6 cm) Weight: (!) 362 lb 8 oz (164.429 kg) IBW/kg (Calculated) : 59.3 Adjusted Body Weight: 91 kg  Vital Signs: Temp: 98.7 F (37.1 C) (06/07 1000) Temp Source: Oral (06/07 1000) BP: 127/72 mmHg (06/07 1036) Pulse Rate: 91 (06/07 1036) Intake/Output from previous day: 06/06 0701 - 06/07 0700 In: 1813.8 [P.O.:720; I.V.:1093.8] Out: 3750 [Urine:3750] Intake/Output from this shift: Total I/O In: 819.2 [P.O.:240; I.V.:579.2] Out: 350 [Urine:350]  Labs:  Recent Labs  08/12/14 0827 08/13/14 0810 08/14/14 0518  WBC 17.0* 14.6* 17.1*  HGB 9.3* 8.7* 8.8*  PLT 315 310 327  CREATININE 0.72  --   --    Estimated Creatinine Clearance: 154 mL/min (by C-G formula based on Cr of 0.72).  Recent Labs  08/11/14 2325 08/13/14 0810  GENTTROUGH  --  3.3*  GENTRANDOM 17.1*  --      Microbiology: Recent Results (from the past 720 hour(s))  Culture, blood (routine x 2)     Status: None (Preliminary result)   Collection Time: 08/11/14  6:35 PM  Result Value Ref Range Status   Specimen Description BLOOD RIGHT ARM  Final   Special Requests BOTTLES DRAWN AEROBIC AND ANAEROBIC 20ML  Final   Culture   Final           BLOOD CULTURE RECEIVED NO GROWTH TO DATE CULTURE WILL BE HELD FOR 5 DAYS BEFORE ISSUING A FINAL NEGATIVE REPORT Performed at [facility]    Report Status PENDING  Incomplete  Culture, blood (routine x 2)     Status: None (Preliminary result)   Collection Time: 08/11/14  6:40 PM  Result Value Ref Range Status   Specimen Description BLOOD RIGHT HAND  Final   Special Requests BOTTLES DRAWN AEROBIC AND ANAEROBIC 20ML  Final   Culture   Final           BLOOD CULTURE RECEIVED NO GROWTH TO DATE CULTURE WILL BE  HELD FOR 5 DAYS BEFORE ISSUING A FINAL NEGATIVE REPORT Performed at [facility]    Report Status PENDING  Incomplete  Body fluid culture     Status: None (Preliminary result)   Collection Time: 08/12/14  9:15 AM  Result Value Ref Range Status   Specimen Description DRAINAGE ABDOMEN  Final   Special Requests Normal  Final   Gram Stain   Final    FEW WBC PRESENT, PREDOMINANTLY MONONUCLEAR NO ORGANISMS SEEN Performed at [facility]    Culture   Final    FEW GROUP B STREP(S.AGALACTIAE)ISOLATED Note: CRITICAL RESULT CALLED TO, READ BACK BY AND VERIFIED WITH: Denton Ar W BY INGRAM A 08/14/14 PAM Performed at [facility]    Report Status PENDING  Incomplete    Medical History: Past Medical History  Diagnosis Date  . Obesity   . Hypertension   . Polysubstance abuse   . Bipolar 1 disorder   . Schizophrenia   . Depression   . Anxiety     Medications:  Clindamycin 900 mg IV q8h Gentamicin 630 mg IV q24h  Assessment: Pt is a 39 yo G5P2 currently on clindamycin and gentamicin for post-op wound infection. Pt not improving clinically per MD. Abdominal drainage cultured and grew few Group B Strep. Pt with anaphylaxis to Ceftin, although tolerated ceftriaxone with no issues.  Due to possible severe allergy and cross sensitivity with penicillins, MD wants to treat GBS with vancomycin rather than ampicillin.   Due to concurrent treatment with gentamicin, will need to closely monitor renal function and hydration status.   Will need to follow up on wound culture for sensitivities and monitor pt clinical status  Goal of Therapy:  Vancomycin trough level 10-15 mcg/ml  Plan:  Vancomycin 2500 mg IV x 1, followed by 1500 mg IV q8h Monitor renal function and follow up sensitivities from wound culture  Linda Sheppard 08/14/2014,11:24 AM

## 2014-08-15 DIAGNOSIS — L02211 Cutaneous abscess of abdominal wall: Secondary | ICD-10-CM

## 2014-08-15 DIAGNOSIS — Z6841 Body Mass Index (BMI) 40.0 and over, adult: Secondary | ICD-10-CM

## 2014-08-15 DIAGNOSIS — I10 Essential (primary) hypertension: Secondary | ICD-10-CM

## 2014-08-15 DIAGNOSIS — B951 Streptococcus, group B, as the cause of diseases classified elsewhere: Secondary | ICD-10-CM

## 2014-08-15 DIAGNOSIS — T814XXA Infection following a procedure, initial encounter: Principal | ICD-10-CM

## 2014-08-15 LAB — BODY FLUID CULTURE: Special Requests: NORMAL

## 2014-08-15 MED ORDER — OXYCODONE-ACETAMINOPHEN 7.5-325 MG PO TABS
1.0000 | ORAL_TABLET | ORAL | Status: DC | PRN
Start: 2014-08-15 — End: 2014-08-16
  Administered 2014-08-15 – 2014-08-16 (×6): 1 via ORAL
  Filled 2014-08-15 (×6): qty 1

## 2014-08-15 MED ORDER — SULFAMETHOXAZOLE-TRIMETHOPRIM 800-160 MG PO TABS: 1.0000 | ORAL_TABLET | Freq: Two times a day (BID) | ORAL | Status: DC

## 2014-08-15 MED ORDER — OXYCODONE-ACETAMINOPHEN 7.5-325 MG PO TABS: 1.0000 | ORAL_TABLET | ORAL | Status: DC | PRN

## 2014-08-15 MED ORDER — CLINDAMYCIN HCL 300 MG PO CAPS: 300.0000 mg | ORAL_CAPSULE | Freq: Three times a day (TID) | ORAL | Status: DC

## 2014-08-15 MED ORDER — IBUPROFEN 600 MG PO TABS: 600.0000 mg | ORAL_TABLET | Freq: Four times a day (QID) | ORAL | Status: DC | PRN

## 2014-08-15 NOTE — Progress Notes (Signed)
*   No surgery found *  Subjective: Patient reports incisional pain, + flatus, + BM and no problems voiding. She is feeling better today, less pain. Although she really wants to go home, she is willing to stay one more day to decrease the risk of readmission.   Objective: I have reviewed patient's vital signs, intake and output, medications, labs and microbiology.  General: alert Resp: clear to auscultation bilaterally Cardio: regular rate and rhythm, S1, S2 normal, no murmur, click, rub or gallop GI: soft, non-tender; bowel sounds normal; no masses,  no organomegaly, somewhat less tender panus  Assessment: s/p * No surgery found *: progressing well  Plan: Discharge home Check CBC in AM  I spoke with the pharmacist about home po meds. Due to her allergies, the best combination we could discover is clinda and bactrim for 2 weeks. Per the patient's request, I have changed her percocet to 7.5 mg  LOS: 4 days    Akeel Reffner C. 08/15/2014, 7:43 AM

## 2014-08-16 ENCOUNTER — Encounter (HOSPITAL_COMMUNITY): Payer: Self-pay | Admitting: Anesthesiology

## 2014-08-16 NOTE — Progress Notes (Signed)
Pt is discharged in the care of boyfriend with R.N. Judd Lien. Denies any further pain or discomfort. Understands all discharged instructions Questions were asked and answered. Spirits are good. No heavy vaginal bleeding Spirits are good. No equipment needed for home use.

## 2014-08-16 NOTE — Discharge Summary (Signed)
Physician Discharge Summary  Patient ID: Linda Sheppard MRN: [id number] DOB/AGE: January 22, 1976 39 y.o.  Admit date: 08/11/2014 Discharge date: 08/16/2014  Admission Diagnoses: post op abscess in subcutaneous fat of panus  Discharge Diagnoses: same Active Problems:   Wound infection after surgery   Discharged Condition: good  Hospital Course: She was admitted and started on gent and cleocin (this choice due to her multiple drug allergies). The fluid collection was sampled with a 18 gauge needle and this eventually grew out [place]. Her abx was changed to vanc. She remained afebrile since hospital day #2. Her pain improved on a daily basis and by the day before discharge she was asking to go home repeatedly.  Consults: None  Significant Diagnostic Studies: labs: microbiology as above  Treatments: antibiotics: as above  Discharge Exam: Blood pressure 147/82, pulse 83, temperature 97.8 F (36.6 C), temperature source Oral, resp. rate 18, height 5\' 6"  (1.676 m), weight 164.429 kg (362 lb 8 oz), last menstrual period 06/19/2014, SpO2 100 %. General appearance: alert Resp: clear to auscultation bilaterally Cardio: regular rate and rhythm, S1, S2 normal, no murmur, click, rub or gallop GI: soft, non-tender; bowel sounds normal; no masses,  no organomegaly  Disposition: 01-Home or Self Care     Medication List    STOP taking these medications        oxyCODONE-acetaminophen 5-325 MG per tablet  Commonly known as:  PERCOCET/ROXICET  Replaced by:  oxyCODONE-acetaminophen 7.5-325 MG per tablet      TAKE these medications        amLODipine 10 MG tablet  Commonly known as:  NORVASC  Take 1 tablet (10 mg total) by mouth daily.     clindamycin 300 MG capsule  Commonly known as:  CLEOCIN  Take 1 capsule (300 mg total) by mouth 3 (three) times daily.     docusate sodium 100 MG capsule  Commonly known as:  COLACE  Take 100 mg by mouth 2 (two) times daily as needed for mild  constipation.     ibuprofen 600 MG tablet  Commonly known as:  ADVIL,MOTRIN  Take 1 tablet (600 mg total) by mouth every 6 (six) hours as needed for moderate pain.     lisinopril-hydrochlorothiazide 10-12.5 MG per tablet  Commonly known as:  PRINZIDE,ZESTORETIC  Take 1 tablet by mouth daily.     oxyCODONE-acetaminophen 7.5-325 MG per tablet  Commonly known as:  PERCOCET  Take 1 tablet by mouth every 4 (four) hours as needed for moderate pain.     simethicone 80 MG chewable tablet  Commonly known as:  MYLICON  Chew 270 mg by mouth every 6 (six) hours as needed for flatulence.     sulfamethoxazole-trimethoprim 800-160 MG per tablet  Commonly known as:  BACTRIM DS,SEPTRA DS  Take 1 tablet by mouth 2 (two) times daily.         Signed: Herby Amick C. 08/16/2014, 7:20 AM

## 2014-08-16 NOTE — Progress Notes (Signed)
CSW briefly met with patient due to patient reporting financial stress secondary to hospitalization.   Patient displayed a full range in affect and was in a pleasant mood.  She discussed financial stressors associated with affording medications.  Patient denied previously participating in [facility] program, and inquired about ability to participate in program with this admission.  CSW consulted with RN who reported that patient has recently participated in the program and is now not eligible.  Patient confirmed that she has Medicaid and prescription discount cards, but that there continue to be other medications that she cannot afford.  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   Patient also expressed financial stress associated with affording rent. She stated that she has already contacted [employer] and [employer] and was informed that they are unable to assist her at this time.  CSW discussed that these are the two resources in the community that assist with these resources, and patient expressed belief that she will "be okay" since she will be able to make an arrangement with her landlord.   CSW inquired about patient's mental health and how she is coping with hospitalization. She expressed feeling emotional and tearful, and discussed how it feels similar to the "[place]".  She stated that she cries frequently for reasons that typically do not make her cry.  Patient discussed feeling supported by her significant other, and discussed how he will provide her with additional support as she continues to heal. [Patient also discussed feelings of sadness since she missed her 37 year old's graduation from high school. She stated that she was able to see pictures on Facebook, but missed the event due to the hospitalization.   Patient denied additional questions, concerns, or needs at this time.   CSW discussed outcome of visit with RN, and informed RN  that CSW does not have additional resources/information to provide for the patient.  RN confirmed that the significant other is also employed and may be able to assist the patient to buy her medications.

## 2014-08-18 LAB — CULTURE, BLOOD (ROUTINE X 2)
CULTURE: NO GROWTH
Culture: NO GROWTH

## 2014-08-20 ENCOUNTER — Telehealth: Payer: Self-pay | Admitting: *Deleted

## 2014-08-20 NOTE — Telephone Encounter (Signed)
Patient called just for some advice on caring for her incision.  She is continuing to take her antibiotics but is having some drainage.  The drainage does not have any odor and the pain she was having is much better.  Advised patient that this was ok and for her to continue to keep the area clean and dry and use a pad to absorb excess drainage.  She will monitor and let us know if she has any further changes or worries about her incision.

## 2014-08-26 ENCOUNTER — Inpatient Hospital Stay (HOSPITAL_COMMUNITY)
Admission: AD | Admit: 2014-08-26 | Discharge: 2014-08-26 | Disposition: A | Discharge status: Home / Self Care | Payer: Medicaid Other | Source: Ambulatory Visit | Attending: Obstetrics & Gynecology | Admitting: Obstetrics & Gynecology

## 2014-08-26 ENCOUNTER — Encounter (HOSPITAL_COMMUNITY): Payer: Self-pay

## 2014-08-26 DIAGNOSIS — F17210 Nicotine dependence, cigarettes, uncomplicated: Secondary | ICD-10-CM | POA: Insufficient documentation

## 2014-08-26 DIAGNOSIS — Z90710 Acquired absence of both cervix and uterus: Secondary | ICD-10-CM | POA: Diagnosis not present

## 2014-08-26 DIAGNOSIS — Z09 Encounter for follow-up examination after completed treatment for conditions other than malignant neoplasm: Secondary | ICD-10-CM | POA: Diagnosis not present

## 2014-08-26 DIAGNOSIS — R109 Unspecified abdominal pain: Secondary | ICD-10-CM | POA: Insufficient documentation

## 2014-08-26 LAB — CBC WITH DIFFERENTIAL/PLATELET
Basophils Absolute: 0 10*3/uL (ref 0.0–0.1)
Basophils Relative: 0 % (ref 0–1)
EOS ABS: 0.1 10*3/uL (ref 0.0–0.7)
Eosinophils Relative: 1 % (ref 0–5)
HEMATOCRIT: 29 % — AB (ref 36.0–46.0)
HEMOGLOBIN: 9.8 g/dL — AB (ref 12.0–15.0)
LYMPHS ABS: 3 10*3/uL (ref 0.7–4.0)
Lymphocytes Relative: 28 % (ref 12–46)
MCH: 24.4 pg — ABNORMAL LOW (ref 26.0–34.0)
MCHC: 33.8 g/dL (ref 30.0–36.0)
MCV: 72.3 fL — ABNORMAL LOW (ref 78.0–100.0)
Monocytes Absolute: 0.6 10*3/uL (ref 0.1–1.0)
Monocytes Relative: 5 % (ref 3–12)
NEUTROS ABS: 7.1 10*3/uL (ref 1.7–7.7)
Neutrophils Relative %: 66 % (ref 43–77)
Platelets: 359 10*3/uL (ref 150–400)
RBC: 4.01 MIL/uL (ref 3.87–5.11)
RDW: 15.2 % (ref 11.5–15.5)
WBC: 10.7 10*3/uL — ABNORMAL HIGH (ref 4.0–10.5)

## 2014-08-26 MED ORDER — IBUPROFEN 600 MG PO TABS: 600.0000 mg | ORAL_TABLET | Freq: Four times a day (QID) | ORAL | Status: DC | PRN

## 2014-08-26 MED ORDER — OXYCODONE-ACETAMINOPHEN 7.5-325 MG PO TABS: 1.0000 | ORAL_TABLET | Freq: Four times a day (QID) | ORAL | Status: DC | PRN

## 2014-08-26 NOTE — MAU Provider Note (Signed)
CSN: [id number]     Arrival date & time 08/26/14  1906 History   None    Chief Complaint  Patient presents with  . Incisional Pain     (Consider location/radiation/quality/duration/timing/severity/associated sxs/prior Treatment) HPI Linda Sheppard is a 39 y.o. female who presents to the MAU with pain in her abdomen. The pain is located on the left side. Patient had hysterectomy 07/31/14 and returned to the MAU 08/11/14 with pain and swelling to the left of the incision site. She had CT scan that showed a fluid collection. The area was drained using needle aspiration and sent for culture. The culture grew out B strep. Patient has been taking Clindamycin and Bactrim and was feeling better until a few days ago when the pain returned. Tonight she returns to the MAU with pain at area where she had an abscess drained. She reports that the area continues to drain but the pain is increasing. No fever, mild nausea, no other problems.  Patient was taking Percocet 7.5 mg and ibuprofen 800 mg but is out of both medications.   Past Medical History  Diagnosis Date  . Obesity   . Hypertension   . Polysubstance abuse   . Bipolar 1 disorder   . Schizophrenia   . Depression   . Anxiety    Past Surgical History  Procedure Laterality Date  . Tubal ligation    . Cesarean section    . Supracervical abdominal hysterectomy N/A 07/31/2014    Procedure: HYSTERECTOMY SUPRACERVICAL ABDOMINAL;  Surgeon: Emily Filbert, MD;  Location: [facility];  Service: Gynecology;  Laterality: N/A;  Requested 07/31/14 @ 10:00a with Nelwyn Salisbury first assist  . Dental surgery     Family History  Problem Relation Age of Onset  . Hypertension Mother   . Osteoarthritis Mother   . Osteoarthritis Father    History  Substance Use Topics  . Smoking status: Current Every Day Smoker -- 0.15 packs/day    Types: Cigarettes    Last Attempt to Quit: 10/18/2012  . Smokeless tobacco: Not on file  . Alcohol Use: Yes     Comment: 48 oz  beer/sporadic   OB History    Gravida Para Term Preterm AB TAB SAB Ectopic Multiple Living   5 3 1 2 1     2       Obstetric Comments   1- SIDS 1- Stillbirth 2 living children     Review of Systems Negative except as stated in HPI   Allergies  Ceftin; Meloxicam; and Tramadol  Home Medications   Prior to Admission medications   Medication Sig Start Date End Date Taking? Authorizing Provider  amLODipine (NORVASC) 10 MG tablet Take 1 tablet (10 mg total) by mouth daily. 08/24/13  Yes Gregor Hams, MD  clindamycin (CLEOCIN) 300 MG capsule Take 1 capsule (300 mg total) by mouth 3 (three) times daily. 08/15/14  Yes Myra Marijo Sanes, MD  lisinopril-hydrochlorothiazide (PRINZIDE,ZESTORETIC) 10-12.5 MG per tablet Take 1 tablet by mouth daily.   Yes Historical Provider, MD  sulfamethoxazole-trimethoprim (BACTRIM DS,SEPTRA DS) 800-160 MG per tablet Take 1 tablet by mouth 2 (two) times daily. 08/15/14  Yes Emily Filbert, MD  ibuprofen (ADVIL,MOTRIN) 600 MG tablet Take 1 tablet (600 mg total) by mouth every 6 (six) hours as needed. 08/26/14   Hope Bunnie Pion, NP  oxyCODONE-acetaminophen (PERCOCET) 7.5-325 MG per tablet Take 1 tablet by mouth every 6 (six) hours as needed for severe pain. 08/26/14   Hope Bunnie Pion, NP  BP 150/88 mmHg  Pulse 96  Temp(Src) 98.4 F (36.9 C) (Oral)  Resp 20  LMP 06/19/2014 Physical Exam  Constitutional: She is oriented to person, place, and time. She appears well-developed and well-nourished. No distress.  HENT:  Head: Normocephalic and atraumatic.  Eyes: EOM are normal.  Neck: Neck supple.  Cardiovascular: Normal rate.   Pulmonary/Chest: Effort normal.  Abdominal: Soft. There is tenderness.    There is an area to the left of the abdomen that is tender and peau d'orange appearance. There is no erythema or red streaking noted.   Musculoskeletal: Normal range of motion.  Neurological: She is alert and oriented to person, place, and time. No cranial nerve deficit.  Skin:  Skin is warm and dry.  Psychiatric: She has a normal mood and affect. Her behavior is normal.  Nursing note and vitals reviewed.   ED Course  Procedures (including critical care time) Labs Review Results for orders placed or performed during the hospital encounter of 08/26/14 (from the past 24 hour(s))  CBC with Differential/Platelet     Status: Abnormal   Collection Time: 08/26/14  7:48 PM  Result Value Ref Range   WBC 10.7 (H) 4.0 - 10.5 K/uL   RBC 4.01 3.87 - 5.11 MIL/uL   Hemoglobin 9.8 (L) 12.0 - 15.0 g/dL   HCT 29.0 (L) 36.0 - 46.0 %   MCV 72.3 (L) 78.0 - 100.0 fL   MCH 24.4 (L) 26.0 - 34.0 pg   MCHC 33.8 30.0 - 36.0 g/dL   RDW 15.2 11.5 - 15.5 %   Platelets 359 150 - 400 K/uL   Neutrophils Relative % 66 43 - 77 %   Neutro Abs 7.1 1.7 - 7.7 K/uL   Lymphocytes Relative 28 12 - 46 %   Lymphs Abs 3.0 0.7 - 4.0 K/uL   Monocytes Relative 5 3 - 12 %   Monocytes Absolute 0.6 0.1 - 1.0 K/uL   Eosinophils Relative 1 0 - 5 %   Eosinophils Absolute 0.1 0.0 - 0.7 K/uL   Basophils Relative 0 0 - 1 %   Basophils Absolute 0.0 0.0 - 0.1 K/uL    Discussed with Dr. Harolyn Rutherford and will treat for pain and have patient keep her f/u appointment with [facility]. She will return to MAU for any problems.   MDM  39 y.o. female with abdominal pain s/p hospitalization for wound infection. Stable for d/c with normal CBC and no erythema or streaking noted. Will refill pain medication and she will continue her antibiotics and follow up as scheduled with the [facility]. She will return as needed for worsening symptoms. Discussed with the patient and all questioned fully answered.   Final diagnoses:  Encounter for recheck of abscess following incision and drainage

## 2014-08-26 NOTE — MAU Note (Signed)
Pt post hysterectomy one month ago. Having continuous pain that she rates 6/10. Has taken prescription pain medication that helps sometimes, but then the pain comes back. Has some nausea at times but has not vomited. Denies fever or chills. Denies vaginal bleeding or any urinary symptoms.

## 2014-08-26 NOTE — Discharge Instructions (Signed)
Your wound area appears to be improving. Your White blood cell count today is normal and there is no red streaking or signs of worsening of the area. Continue your antibiotics and take the pain medication as directed. Do not drive while taking the narcotic as it will make you sleepy. Return here as needed for any problems.

## 2014-09-16 ENCOUNTER — Encounter (HOSPITAL_COMMUNITY): Payer: Self-pay

## 2014-09-16 ENCOUNTER — Inpatient Hospital Stay (HOSPITAL_COMMUNITY)
Admission: AD | Admit: 2014-09-16 | Discharge: 2014-09-16 | Disposition: A | Discharge status: Home / Self Care | Payer: Medicaid Other | Source: Ambulatory Visit | Attending: Obstetrics & Gynecology | Admitting: Obstetrics & Gynecology

## 2014-09-16 DIAGNOSIS — F17210 Nicotine dependence, cigarettes, uncomplicated: Secondary | ICD-10-CM | POA: Insufficient documentation

## 2014-09-16 DIAGNOSIS — R109 Unspecified abdominal pain: Secondary | ICD-10-CM | POA: Insufficient documentation

## 2014-09-16 DIAGNOSIS — T814XXA Infection following a procedure, initial encounter: Secondary | ICD-10-CM | POA: Insufficient documentation

## 2014-09-16 DIAGNOSIS — R1032 Left lower quadrant pain: Secondary | ICD-10-CM

## 2014-09-16 DIAGNOSIS — Z90710 Acquired absence of both cervix and uterus: Secondary | ICD-10-CM | POA: Insufficient documentation

## 2014-09-16 LAB — CBC
HCT: 29.6 % — ABNORMAL LOW (ref 36.0–46.0)
Hemoglobin: 9.7 g/dL — ABNORMAL LOW (ref 12.0–15.0)
MCH: 24.3 pg — ABNORMAL LOW (ref 26.0–34.0)
MCHC: 32.8 g/dL (ref 30.0–36.0)
MCV: 74.2 fL — ABNORMAL LOW (ref 78.0–100.0)
Platelets: 285 10*3/uL (ref 150–400)
RBC: 3.99 MIL/uL (ref 3.87–5.11)
RDW: 16.8 % — ABNORMAL HIGH (ref 11.5–15.5)
WBC: 10.5 10*3/uL (ref 4.0–10.5)

## 2014-09-16 LAB — URINALYSIS, ROUTINE W REFLEX MICROSCOPIC
Bilirubin Urine: NEGATIVE
GLUCOSE, UA: NEGATIVE mg/dL
Hgb urine dipstick: NEGATIVE
Ketones, ur: NEGATIVE mg/dL
Leukocytes, UA: NEGATIVE
Nitrite: NEGATIVE
PH: 5.5 (ref 5.0–8.0)
Protein, ur: NEGATIVE mg/dL
Urobilinogen, UA: 0.2 mg/dL (ref 0.0–1.0)

## 2014-09-16 MED ORDER — IBUPROFEN 800 MG PO TABS
800.0000 mg | ORAL_TABLET | Freq: Once | ORAL | Status: AC
  Administered 2014-09-16: 800 mg via ORAL
  Filled 2014-09-16: qty 1

## 2014-09-16 MED ORDER — OXYCODONE-ACETAMINOPHEN 7.5-325 MG PO TABS: 1.0000 | ORAL_TABLET | ORAL | Status: DC | PRN

## 2014-09-16 MED ORDER — OXYCODONE-ACETAMINOPHEN 7.5-325 MG PO TABS
1.0000 | ORAL_TABLET | Freq: Once | ORAL | Status: AC
  Administered 2014-09-16: 1 via ORAL
  Filled 2014-09-16: qty 1

## 2014-09-16 NOTE — Discharge Instructions (Signed)

## 2014-09-16 NOTE — MAU Note (Signed)
Pt here with abdominal pain on the lower left side.  Reports having a partial hysterectomy in May, trouble since then. Having diarrhea since Thursday, but better today.

## 2014-09-16 NOTE — MAU Provider Note (Signed)
History     CSN: [id number]  Arrival date and time: 09/16/14 1829   First Provider Initiated Contact with Patient 09/16/14 2002      Chief Complaint  Patient presents with  . Abdominal Pain   HPI  Linda Sheppard is a 39 y.o. female who presents to the MAU with pain in her abdomen. The pain is located on the left side. Patient had hysterectomy 07/31/14 and returned to the MAU 08/11/14 with pain and swelling to the left of the incision site. She had CT scan that showed a fluid collection. The area was drained using needle aspiration and sent for culture. The culture grew out B strep. Patient has been taking Clindamycin and Bactrim and was feeling better until a few days ago when the pain returned. Tonight she returns to the MAU with pain in her left side that she describes as a knot. She states the pain is better than before but she is still hurting. She states that she has not had any drainage from her abcess for 1 week. No fever, mild nausea, no other problems. Patient was taking Percocet 7.5 mg and ibuprofen 800 mg but is out of both medications.   Past Medical History  Diagnosis Date  . Obesity   . Hypertension   . Polysubstance abuse   . Bipolar 1 disorder   . Schizophrenia   . Depression   . Anxiety     Past Surgical History  Procedure Laterality Date  . Tubal ligation    . Cesarean section    . Supracervical abdominal hysterectomy N/A 07/31/2014    Procedure: HYSTERECTOMY SUPRACERVICAL ABDOMINAL;  Surgeon: Emily Filbert, MD;  Location: [facility];  Service: Gynecology;  Laterality: N/A;  Requested 07/31/14 @ 10:00a with Nelwyn Salisbury first assist  . Dental surgery      Family History  Problem Relation Age of Onset  . Hypertension Mother   . Osteoarthritis Mother   . Osteoarthritis Father     History  Substance Use Topics  . Smoking status: Current Every Day Smoker -- 0.15 packs/day    Types: Cigarettes    Last Attempt to Quit: 10/18/2012  . Smokeless tobacco: Not on file  .  Alcohol Use: Yes     Comment: 48 oz beer/sporadic    Allergies:  Allergies  Allergen Reactions  . Ceftin Anaphylaxis  . Meloxicam Anaphylaxis  . Tramadol Anaphylaxis    Prescriptions prior to admission  Medication Sig Dispense Refill Last Dose  . amLODipine (NORVASC) 10 MG tablet Take 1 tablet (10 mg total) by mouth daily. 30 tablet 0 09/15/2014 at Unknown time  . diphenhydrAMINE (BENADRYL) 25 mg capsule Take 25 mg by mouth every 6 (six) hours as needed for allergies.   Past Week at Unknown time  . ibuprofen (ADVIL,MOTRIN) 200 MG tablet Take 600 mg by mouth every 6 (six) hours as needed for moderate pain.   09/15/2014 at Unknown time  . lisinopril-hydrochlorothiazide (PRINZIDE,ZESTORETIC) 10-12.5 MG per tablet Take 1 tablet by mouth daily.   09/15/2014 at Unknown time  . clindamycin (CLEOCIN) 300 MG capsule Take 1 capsule (300 mg total) by mouth 3 (three) times daily. (Patient not taking: Reported on 09/16/2014) 36 capsule 0 Not Taking at Unknown time  . ibuprofen (ADVIL,MOTRIN) 600 MG tablet Take 1 tablet (600 mg total) by mouth every 6 (six) hours as needed. (Patient not taking: Reported on 09/16/2014) 30 tablet 0 Not Taking at Unknown time  . oxyCODONE-acetaminophen (PERCOCET) 7.5-325 MG per tablet Take 1  tablet by mouth every 6 (six) hours as needed for severe pain. (Patient not taking: Reported on 09/16/2014) 20 tablet 0 Not Taking at Unknown time  . sulfamethoxazole-trimethoprim (BACTRIM DS,SEPTRA DS) 800-160 MG per tablet Take 1 tablet by mouth 2 (two) times daily. (Patient not taking: Reported on 09/16/2014) 28 tablet 0 Completed Course at Unknown time    Review of Systems  Constitutional: Negative for fever.  Gastrointestinal: Positive for abdominal pain.  All other systems reviewed and are negative.  Physical Exam   Blood pressure 157/94, pulse 87, temperature 98.3 F (36.8 C), temperature source Oral, resp. rate 20, last menstrual period 06/19/2014.  Physical Exam  Nursing note  and vitals reviewed. Constitutional: She is oriented to person, place, and time. She appears well-developed and well-nourished. No distress.  HENT:  Head: Normocephalic and atraumatic.  Cardiovascular: Normal rate.   Respiratory: Effort normal. No respiratory distress.  GI: Soft. There is tenderness.  Left lower quadrant; obese abdomen  Musculoskeletal: Normal range of motion.  Neurological: She is alert and oriented to person, place, and time.  Skin: Skin is warm and dry. No rash noted. No erythema.  Psychiatric: She has a normal mood and affect. Her behavior is normal. Judgment and thought content normal.    MAU Course  Procedures Results for orders placed or performed during the hospital encounter of 09/16/14 (from the past 24 hour(s))  Urinalysis, Routine w reflex microscopic (not at [facility])     Status: Abnormal   Collection Time: 09/16/14  6:55 PM  Result Value Ref Range   Color, Urine YELLOW YELLOW   APPearance CLEAR CLEAR   Specific Gravity, Urine >1.030 (H) 1.005 - 1.030   pH 5.5 5.0 - 8.0   Glucose, UA NEGATIVE NEGATIVE mg/dL   Hgb urine dipstick NEGATIVE NEGATIVE   Bilirubin Urine NEGATIVE NEGATIVE   Ketones, ur NEGATIVE NEGATIVE mg/dL   Protein, ur NEGATIVE NEGATIVE mg/dL   Urobilinogen, UA 0.2 0.0 - 1.0 mg/dL   Nitrite NEGATIVE NEGATIVE   Leukocytes, UA NEGATIVE NEGATIVE  CBC     Status: Abnormal   Collection Time: 09/16/14  8:35 PM  Result Value Ref Range   WBC 10.5 4.0 - 10.5 K/uL   RBC 3.99 3.87 - 5.11 MIL/uL   Hemoglobin 9.7 (L) 12.0 - 15.0 g/dL   HCT 29.6 (L) 36.0 - 46.0 %   MCV 74.2 (L) 78.0 - 100.0 fL   MCH 24.3 (L) 26.0 - 34.0 pg   MCHC 32.8 30.0 - 36.0 g/dL   RDW 16.8 (H) 11.5 - 15.5 %   Platelets 285 150 - 400 K/uL   MDM CBC; Percocet, Ibuprofen. Consulted with Dr Elonda Husky if CBC ok pt can go home and follow up with Dr Hulan Fray at regular scheduled appt. Turned care over to [place] op wound infection Abdominal  Pain ibuprofen Percocet Discharge to home  [facility] 09/16/2014, 8:12 PM

## 2014-09-21 ENCOUNTER — Encounter: Payer: Self-pay | Admitting: *Deleted

## 2014-09-23 ENCOUNTER — Inpatient Hospital Stay (HOSPITAL_COMMUNITY)
Admission: AD | Admit: 2014-09-23 | Discharge: 2014-09-23 | Disposition: A | Discharge status: Home / Self Care | Payer: Medicaid Other | Source: Ambulatory Visit | Attending: Obstetrics and Gynecology | Admitting: Obstetrics and Gynecology

## 2014-09-23 ENCOUNTER — Encounter (HOSPITAL_COMMUNITY): Payer: Self-pay

## 2014-09-23 DIAGNOSIS — F17210 Nicotine dependence, cigarettes, uncomplicated: Secondary | ICD-10-CM | POA: Diagnosis not present

## 2014-09-23 DIAGNOSIS — G8918 Other acute postprocedural pain: Secondary | ICD-10-CM

## 2014-09-23 LAB — CBC WITH DIFFERENTIAL/PLATELET
BASOS ABS: 0 10*3/uL (ref 0.0–0.1)
BASOS PCT: 0 % (ref 0–1)
EOS ABS: 0.1 10*3/uL (ref 0.0–0.7)
EOS PCT: 1 % (ref 0–5)
HEMATOCRIT: 29.6 % — AB (ref 36.0–46.0)
HEMOGLOBIN: 9.8 g/dL — AB (ref 12.0–15.0)
Lymphocytes Relative: 29 % (ref 12–46)
Lymphs Abs: 3 10*3/uL (ref 0.7–4.0)
MCH: 24.3 pg — ABNORMAL LOW (ref 26.0–34.0)
MCHC: 33.1 g/dL (ref 30.0–36.0)
MCV: 73.3 fL — AB (ref 78.0–100.0)
MONO ABS: 0.7 10*3/uL (ref 0.1–1.0)
Monocytes Relative: 7 % (ref 3–12)
Neutro Abs: 6.6 10*3/uL (ref 1.7–7.7)
Neutrophils Relative %: 63 % (ref 43–77)
PLATELETS: 298 10*3/uL (ref 150–400)
RBC: 4.04 MIL/uL (ref 3.87–5.11)
RDW: 16.5 % — ABNORMAL HIGH (ref 11.5–15.5)
WBC: 10.3 10*3/uL (ref 4.0–10.5)

## 2014-09-23 LAB — URINALYSIS, ROUTINE W REFLEX MICROSCOPIC
Bilirubin Urine: NEGATIVE
GLUCOSE, UA: NEGATIVE mg/dL
Hgb urine dipstick: NEGATIVE
Ketones, ur: NEGATIVE mg/dL
Leukocytes, UA: NEGATIVE
Nitrite: NEGATIVE
Protein, ur: NEGATIVE mg/dL
SPECIFIC GRAVITY, URINE: 1.015 (ref 1.005–1.030)
UROBILINOGEN UA: 0.2 mg/dL (ref 0.0–1.0)
pH: 6 (ref 5.0–8.0)

## 2014-09-23 LAB — SEDIMENTATION RATE: SED RATE: 19 mm/h (ref 0–22)

## 2014-09-23 MED ORDER — OXYCODONE-ACETAMINOPHEN 5-325 MG PO TABS
1.0000 | ORAL_TABLET | Freq: Four times a day (QID) | ORAL | Status: DC | PRN
Start: 2014-09-23 — End: 2014-10-21

## 2014-09-23 NOTE — MAU Provider Note (Signed)
History     CSN: [id number]  Arrival date and time: 09/23/14 2025   First Provider Initiated Contact with Patient 09/23/14 2104      Chief Complaint  Patient presents with  . Incisional Pain   HPI  Linda Sheppard is a 39 y.o. [phone] who presents to MAU today with complaint of left sided incisional pain. The patient had a supracervical hysterectomy and bilateral salpingectomy on 07/31/14. She was admitted to the hospital for aspiration of a post operative abscess with fever. The culture of the aspirate was + for GBS. She was discharged from women's unit on 08/16/14. She has since had 2 additional visits in MAU on 08/26/14 and 09/16/14 with the same complaint as today. She rates her pain now at 6/10. She states that it was worse earlier and she took Ibuprofen 600 mg with some relief. She denies drainage from the incision x 2 weeks. She denies N/V, constipation or fever. She does endorse multiple loose and watery stools intermittently over the last few weeks. She feels that this is diet related.   OB History    Gravida Para Term Preterm AB TAB SAB Ectopic Multiple Living   5 3 1 2 1     2       Obstetric Comments   1- SIDS 1- Stillbirth 2 living children      Past Medical History  Diagnosis Date  . Obesity   . Hypertension   . Polysubstance abuse   . Bipolar 1 disorder   . Schizophrenia   . Depression   . Anxiety     Past Surgical History  Procedure Laterality Date  . Tubal ligation    . Cesarean section    . Supracervical abdominal hysterectomy N/A 07/31/2014    Procedure: HYSTERECTOMY SUPRACERVICAL ABDOMINAL;  Surgeon: Emily Filbert, MD;  Location: [facility];  Service: Gynecology;  Laterality: N/A;  Requested 07/31/14 @ 10:00a with Nelwyn Salisbury first assist  . Dental surgery      Family History  Problem Relation Age of Onset  . Hypertension Mother   . Osteoarthritis Mother   . Osteoarthritis Father     History  Substance Use Topics  . Smoking status: Current Every Day  Smoker -- 0.15 packs/day    Types: Cigarettes    Last Attempt to Quit: 10/18/2012  . Smokeless tobacco: Not on file  . Alcohol Use: Yes     Comment: 48 oz beer/sporadic    Allergies:  Allergies  Allergen Reactions  . Ceftin Anaphylaxis  . Meloxicam Anaphylaxis  . Tramadol Anaphylaxis    Prescriptions prior to admission  Medication Sig Dispense Refill Last Dose  . amLODipine (NORVASC) 10 MG tablet Take 1 tablet (10 mg total) by mouth daily. 30 tablet 0 09/22/2014 at Unknown time  . diphenhydrAMINE (BENADRYL) 25 mg capsule Take 25 mg by mouth every 6 (six) hours as needed for allergies.   Past Week at Unknown time  . ibuprofen (ADVIL,MOTRIN) 600 MG tablet Take 1 tablet (600 mg total) by mouth every 6 (six) hours as needed. (Patient taking differently: Take 600 mg by mouth every 6 (six) hours as needed for mild pain. ) 30 tablet 0 09/23/2014 at 1930  . lisinopril-hydrochlorothiazide (PRINZIDE,ZESTORETIC) 10-12.5 MG per tablet Take 1 tablet by mouth daily.   09/22/2014 at Unknown time  . oxyCODONE-acetaminophen (PERCOCET) 7.5-325 MG per tablet Take 1 tablet by mouth every 4 (four) hours as needed for severe pain. 20 tablet 0 Past Week at Unknown time  Review of Systems  Constitutional: Negative for fever and malaise/fatigue.  Gastrointestinal: Positive for abdominal pain and diarrhea. Negative for nausea, vomiting and constipation.  Genitourinary: Negative for dysuria, urgency and frequency.       Neg - vaginal bleeding   Physical Exam   Blood pressure 156/109, pulse 83, temperature 98.4 F (36.9 C), temperature source Oral, resp. rate 20, height 5\' 5"  (1.651 m), weight 364 lb (165.109 kg), last menstrual period 06/19/2014.  Physical Exam  Nursing note and vitals reviewed. Constitutional: She is oriented to person, place, and time. She appears well-developed and well-nourished. No distress.  HENT:  Head: Normocephalic and atraumatic.  Cardiovascular: Normal rate.   Respiratory:  Effort normal.  GI: Soft. She exhibits no distension and no mass. There is tenderness (mild tenderness to palpation of the LLQ). There is no rebound and no guarding.  Incision is healing well. No drainage, bleeding or surrounding edema or erythema  Neurological: She is alert and oriented to person, place, and time.  Skin: Skin is warm and dry. No erythema.  Psychiatric: She has a normal mood and affect.   Results for orders placed or performed during the hospital encounter of 09/23/14 (from the past 24 hour(s))  Urinalysis, Routine w reflex microscopic (not at [facility])     Status: None   Collection Time: 09/23/14  8:30 PM  Result Value Ref Range   Color, Urine YELLOW YELLOW   APPearance CLEAR CLEAR   Specific Gravity, Urine 1.015 1.005 - 1.030   pH 6.0 5.0 - 8.0   Glucose, UA NEGATIVE NEGATIVE mg/dL   Hgb urine dipstick NEGATIVE NEGATIVE   Bilirubin Urine NEGATIVE NEGATIVE   Ketones, ur NEGATIVE NEGATIVE mg/dL   Protein, ur NEGATIVE NEGATIVE mg/dL   Urobilinogen, UA 0.2 0.0 - 1.0 mg/dL   Nitrite NEGATIVE NEGATIVE   Leukocytes, UA NEGATIVE NEGATIVE  CBC with Differential/Platelet     Status: Abnormal   Collection Time: 09/23/14  8:50 PM  Result Value Ref Range   WBC 10.3 4.0 - 10.5 K/uL   RBC 4.04 3.87 - 5.11 MIL/uL   Hemoglobin 9.8 (L) 12.0 - 15.0 g/dL   HCT 29.6 (L) 36.0 - 46.0 %   MCV 73.3 (L) 78.0 - 100.0 fL   MCH 24.3 (L) 26.0 - 34.0 pg   MCHC 33.1 30.0 - 36.0 g/dL   RDW 16.5 (H) 11.5 - 15.5 %   Platelets 298 150 - 400 K/uL   Neutrophils Relative % 63 43 - 77 %   Neutro Abs 6.6 1.7 - 7.7 K/uL   Lymphocytes Relative 29 12 - 46 %   Lymphs Abs 3.0 0.7 - 4.0 K/uL   Monocytes Relative 7 3 - 12 %   Monocytes Absolute 0.7 0.1 - 1.0 K/uL   Eosinophils Relative 1 0 - 5 %   Eosinophils Absolute 0.1 0.0 - 0.7 K/uL   Basophils Relative 0 0 - 1 %   Basophils Absolute 0.0 0.0 - 0.1 K/uL    MAU Course  Procedures None  MDM UA, CBC today Discussed with Dr. Glo Herring. He  recommends Sed Rate today in addition to CBC today and Rx for Percocet. Patient should follow-up with Dr. Hulan Fray as scheduled.  Patient requests [place] multiple times today and it was explained multiple times that this would not be beneficial in understanding her pain. Patient voiced understanding.  Patient did not take BP medication today. Advised of warning signs/symptoms for HTN and CVA. Advised to take BP medication as directed. If warning  signs arise patient should go to [facility] for further evaluation. Patient voiced understanding.  Assessment and Plan  A: Post operative pain  P: Discharge home Rx for Percocet given to aptient Warning signs for worsening condition discussed Patient advised to follow-up with Dr. Hulan Fray this week as scheduled in [place] Patient may return to MAU as needed or if her condition were to change or worsen   Luvenia Redden, PA-C  09/23/2014, 9:23 PM

## 2014-09-23 NOTE — MAU Note (Signed)
Had a partial hysterectomy in May. Left lower abdominal pain has continued, rates 6/10. Took ibuprofen 600mg  at 1930, which helped some. Coughing makes it worse.

## 2014-09-23 NOTE — Discharge Instructions (Signed)
Supracervical Hysterectomy, Care After Refer to this sheet in the next few weeks. These instructions provide you with information on caring for yourself after your procedure. Your health care provider may also give you more specific instructions. Your treatment has been planned according to current medical practices, but problems sometimes occur. Call your health care provider if you have any problems or questions after your procedure.  WHAT TO EXPECT AFTER THE PROCEDURE After your procedure, it is typical to have some discomfort, tenderness, swelling, and bruising at the surgical sites. This normally lasts for about 2 weeks.  HOME CARE INSTRUCTIONS   Get plenty of rest and sleep.  Only take over-the-counter or prescription medicines as directed by your health care provider.  Do not take aspirin. It can cause bleeding.  Do not drive until your health care provider approves.  Follow your health care provider's advice regarding exercise, lifting, and general activities.  Resume your usual diet as directed by your health care provider.  Do not douche, use tampons, or have sexual intercourse for at least 6 weeks or until your health care provider gives you permission.  Change your bandages (dressings) only as directed by your health care provider.  Monitor your temperature.  Take showers instead of baths for 2-3 weeks or as directed by your health care provider.  Drink enough fluids to keep your urine clear or pale yellow.  Do not drink alcohol until your health care provider gives you permission.  If you are constipated, you may take a mild laxative if your health care provider approves. Bran foods may also help with constipation problems.  Try to have someone home with you for 1-2 weeks to help with activities.  Follow up with your health care provider as directed. SEEK MEDICAL CARE IF:  You have swelling, redness, or increasing pain in the incision area.  You have pus coming  from an incision.  You notice a bad smell coming from the incision or dressing.  You have swelling, redness, or pain in the area around the IV site.  Your incision breaks open.  You feel dizzy or lightheaded.  You have pain or bleeding when you urinate.  You have persistent diarrhea.  You have persistent nausea and vomiting.  You have abnormal vaginal discharge.  You have a rash.  Your pain is not controlled with your prescribed medicine. SEEK IMMEDIATE MEDICAL CARE IF:  You have a fever.  You have severe abdominal pain.  You have chest pain.  You have shortness of breath.  You faint.  You have pain, swelling, or redness in your leg.  You have heavy vaginal bleeding with blood clots. Document Released: 12/14/2012 Document Reviewed: 12/14/2012 [facility] Patient Information 2015 [place], [place]. This information is not intended to replace advice given to you by your health care provider. Make sure you discuss any questions you have with your health care provider.

## 2014-09-26 ENCOUNTER — Ambulatory Visit: Payer: Self-pay | Admitting: Obstetrics & Gynecology

## 2014-09-26 ENCOUNTER — Encounter: Payer: Self-pay | Admitting: Obstetrics & Gynecology

## 2014-09-26 VITALS — BP 184/128 | HR 95 | Temp 98.6°F | Ht 65.0 in | Wt 362.4 lb

## 2014-09-26 DIAGNOSIS — Z9889 Other specified postprocedural states: Secondary | ICD-10-CM

## 2014-09-26 NOTE — Progress Notes (Signed)
Pt reports having severe pain in incision.

## 2014-09-26 NOTE — Progress Notes (Signed)
   Subjective:    Patient ID: Linda Sheppard, female    DOB: 08-26-1975, 39 y.o.   MRN: [id number]  HPI  39 yo morbidly obese AA woman now about 7 weeks post op s/p SCH/BS. She reports continued left sided pain, especially with coughing. She has been to the MAU 3 times in the last 3 weeks and has gotten percocet at each visit. She is requesting more percocet today.  She has not had sex yet but has already returned to work.  Review of Systems Pathology negative except for fibroids.    Objective:   Physical Exam  Pleasant BFNAD Breathing, ambulating and conversing normally Abd- obese but entirely benign Incision- healed well      Assessment & Plan:  We discussed that her dependence on narcotics may develop into a problem and I declined to give her more. I offered to order an u/s or CT but she thinks that she will wait and see if she gets better.

## 2014-10-10 ENCOUNTER — Emergency Department (HOSPITAL_COMMUNITY)
Admission: EM | Admit: 2014-10-10 | Discharge: 2014-10-10 | Disposition: A | Discharge status: Home / Self Care | Payer: Medicaid Other | Attending: Emergency Medicine | Admitting: Emergency Medicine

## 2014-10-10 ENCOUNTER — Encounter (HOSPITAL_COMMUNITY): Payer: Self-pay | Admitting: *Deleted

## 2014-10-10 DIAGNOSIS — F319 Bipolar disorder, unspecified: Secondary | ICD-10-CM | POA: Insufficient documentation

## 2014-10-10 DIAGNOSIS — I10 Essential (primary) hypertension: Secondary | ICD-10-CM | POA: Diagnosis not present

## 2014-10-10 DIAGNOSIS — M542 Cervicalgia: Secondary | ICD-10-CM | POA: Insufficient documentation

## 2014-10-10 DIAGNOSIS — Z79899 Other long term (current) drug therapy: Secondary | ICD-10-CM | POA: Insufficient documentation

## 2014-10-10 DIAGNOSIS — F209 Schizophrenia, unspecified: Secondary | ICD-10-CM | POA: Diagnosis not present

## 2014-10-10 DIAGNOSIS — E669 Obesity, unspecified: Secondary | ICD-10-CM | POA: Diagnosis not present

## 2014-10-10 DIAGNOSIS — Z720 Tobacco use: Secondary | ICD-10-CM | POA: Insufficient documentation

## 2014-10-10 DIAGNOSIS — F419 Anxiety disorder, unspecified: Secondary | ICD-10-CM | POA: Insufficient documentation

## 2014-10-10 DIAGNOSIS — M62838 Other muscle spasm: Secondary | ICD-10-CM

## 2014-10-10 DIAGNOSIS — M546 Pain in thoracic spine: Secondary | ICD-10-CM | POA: Diagnosis not present

## 2014-10-10 MED ORDER — METHOCARBAMOL 500 MG PO TABS: 500.0000 mg | ORAL_TABLET | Freq: Two times a day (BID) | ORAL | Status: DC

## 2014-10-10 MED ORDER — METHOCARBAMOL 500 MG PO TABS
500.0000 mg | ORAL_TABLET | Freq: Once | ORAL | Status: AC
  Administered 2014-10-10: 500 mg via ORAL
  Filled 2014-10-10: qty 1

## 2014-10-10 MED ORDER — OXYCODONE-ACETAMINOPHEN 5-325 MG PO TABS
1.0000 | ORAL_TABLET | Freq: Once | ORAL | Status: AC
  Administered 2014-10-10: 1 via ORAL
  Filled 2014-10-10: qty 1

## 2014-10-10 NOTE — ED Notes (Signed)
PA at bedside.

## 2014-10-10 NOTE — Discharge Instructions (Signed)
Medications: Robaxin and home ibuprofen 800 mg TID with food Treatment: Ice, heat, and stretching Follow-up: With PCP this week for further evaluation   Back Exercises Back exercises help treat and prevent back injuries. The goal of back exercises is to increase the strength of your abdominal and back muscles and the flexibility of your back. These exercises should be started when you no longer have back pain. Back exercises include:  Pelvic Tilt. Lie on your back with your knees bent. Tilt your pelvis until the lower part of your back is against the floor. Hold this position 5 to 10 sec and repeat 5 to 10 times.  Knee to Chest. Pull first 1 knee up against your chest and hold for 20 to 30 seconds, repeat this with the other knee, and then both knees. This may be done with the other leg straight or bent, whichever feels better.  Sit-Ups or Curl-Ups. Bend your knees 90 degrees. Start with tilting your pelvis, and do a partial, slow sit-up, lifting your trunk only 30 to 45 degrees off the floor. Take at least 2 to 3 seconds for each sit-up. Do not do sit-ups with your knees out straight. If partial sit-ups are difficult, simply do the above but with only tightening your abdominal muscles and holding it as directed.  Hip-Lift. Lie on your back with your knees flexed 90 degrees. Push down with your feet and shoulders as you raise your hips a couple inches off the floor; hold for 10 seconds, repeat 5 to 10 times.  Back arches. Lie on your stomach, propping yourself up on bent elbows. Slowly press on your hands, causing an arch in your low back. Repeat 3 to 5 times. Any initial stiffness and discomfort should lessen with repetition over time.  Shoulder-Lifts. Lie face down with arms beside your body. Keep hips and torso pressed to floor as you slowly lift your head and shoulders off the floor. Do not overdo your exercises, especially in the beginning. Exercises may cause you some mild back discomfort  which lasts for a few minutes; however, if the pain is more severe, or lasts for more than 15 minutes, do not continue exercises until you see your caregiver. Improvement with exercise therapy for back problems is slow.  See your caregivers for assistance with developing a proper back exercise program. Document Released: 04/02/2004 Document Revised: 05/18/2011 Document Reviewed: 12/25/2010 [facility] Patient Information 2015 [place], [place]. This information is not intended to replace advice given to you by your health care provider. Make sure you discuss any questions you have with your health care provider.

## 2014-10-10 NOTE — ED Notes (Signed)
Pt c/o posterior neck pain with pain radiating to left upper back x 2days.  Pt took motrin 1hr prior to arrival to ER. Pt denies injury.

## 2014-10-10 NOTE — ED Provider Notes (Signed)
S: Linda Sheppard is a 39 y.o. female presents to the ED with posterior neck pain radiating to the upper back x 2 weeks but becoming gradually more intense in the last 2 days days.  Pt reports taking motrin 1 hour PTA without relief.  She denies trauma, falls or known injury.   O: General: Awake  HEENT: Atraumatic  Neck: Somewhat decreased ROM of the neck due to pain and poor effort Resp: Normal effort  Abd: Nondistended  Neuro:No focal weakness  MSK: TTP of the left paraspinal muscles of the cervical spine and upper thoracic spine; palpable muscle spasm of the left trapezius; no midline tenderness, step off or deformity.  BP 157/111 mmHg  Pulse 87  Temp(Src) 98.3 F (36.8 C) (Oral)  Resp 18  Ht 5\' 6"  (1.676 m)  Wt 390 lb (176.903 kg)  BMI 62.98 kg/m2  SpO2 100%  LMP 06/19/2014   A/P: Pt with trapezius spasm and left paraspinal cervical tenderness consistent with muscle strain.  NO trauma. Doubt fracture.  No signs of radiculopathy.  Pt denies CA, IVDU or anticoagulants.  No imaging indicated at this time.  Pt without headache, fever, vision changes; no evidence of meningitis.  Discussed conservative measures with the patient.  Pt will be d/c home with antiinflammatories and muscle relaxer.  Pt treated with pain medicine in the ED, but record review shows multiple visits recently seeking narcotic pain control s/p hysterectomy.  Will not d/c home with narcotics today.  She if to f/u with PCP this week for further evaluation of her neck pain.    Patient noted to be hypertensive in the emergency department.  No signs of hypertensive urgency.  She has not yet taken her HTN medications this morning. Discussed with patient the need for close follow-up and management by their primary care physician.    1. Neck pain on left side   2. Trapezius muscle spasm   3. Left-sided thoracic back pain     Pt was seen by Linda Limbo, PA-C and supervised by Linda Butts, PA-C and Linda Cahill, MD.     Linda Butts, PA-C 10/10/14 [place], MD 10/10/14 [phone]

## 2014-10-10 NOTE — ED Notes (Signed)
Pt states that she is experiencing neck pain radiating to the top of her back; pt states that the pain began yesterday; pt denies injury or trauma to the area; pt states that it hurts to move or turn her neck or lean forward

## 2014-10-10 NOTE — ED Provider Notes (Signed)
CSN: [id number]     Arrival date & time 10/10/14  0533 History   First MD Initiated Contact with Patient 10/10/14 0602     Chief Complaint  Patient presents with  . Neck Pain      HPI  Linda Sheppard is a 39 year old female with a past medical history significant for HTN who presents to the ED with a 2 week history of intermittent left-sided neck and thoracic back pain. She reports she woke up Tuesday morning and the pain was more intense than usual. She states the pain is constant and feels "sharp and aching". Movement exacerbates her pain. She has tried taking ibuprofen for pain control with no relief. She denies headache, visual changes, nausea, vomiting, numbness, weakness, paresthesia, history of cancer, anticoagulant use, IVDU, recent injury. 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 She denies heavy lifting.     Past Medical History  Diagnosis Date  . Obesity   . Hypertension   . Polysubstance abuse   . Bipolar 1 disorder   . Schizophrenia   . Depression   . Anxiety    Past Surgical History  Procedure Laterality Date  . Tubal ligation    . Cesarean section    . Supracervical abdominal hysterectomy N/A 07/31/2014    Procedure: HYSTERECTOMY SUPRACERVICAL ABDOMINAL;  Surgeon: Emily Filbert, MD;  Location: [facility];  Service: Gynecology;  Laterality: N/A;  Requested 07/31/14 @ 10:00a with Nelwyn Salisbury first assist  . Dental surgery     Family History  Problem Relation Age of Onset  . Hypertension Mother   . Osteoarthritis Mother   . Osteoarthritis Father    History  Substance Use Topics  . Smoking status: Current Every Day Smoker -- 0.15 packs/day    Types: Cigarettes    Last Attempt to Quit: 10/18/2012  . Smokeless tobacco: Not on file  . Alcohol Use: Yes     Comment: 48 oz beer/sporadic   OB History    Gravida Para Term Preterm AB TAB SAB Ectopic Multiple Living   5 3 1 2 1     2       Obstetric Comments   1- SIDS 1-  Stillbirth 2 living children     Review of Systems  Constitutional: Negative for fever and chills.  Eyes: Negative for visual disturbance.  Gastrointestinal: Negative for nausea, vomiting and abdominal pain.  Musculoskeletal: Positive for myalgias, back pain, neck pain and neck stiffness.       Reports left sided neck and thoracic back pain.  Skin: Negative for color change, rash and wound.  Neurological: Negative for dizziness, weakness, light-headedness, numbness and headaches.      Allergies  Ceftin; Meloxicam; and Tramadol  Home Medications   Prior to Admission medications   Medication Sig Start Date End Date Taking? Authorizing Provider  amLODipine (NORVASC) 10 MG tablet Take 1 tablet (10 mg total) by mouth daily. 08/24/13  Yes Gregor Hams, MD  diphenhydrAMINE (BENADRYL) 25 mg capsule Take 25 mg by mouth every 6 (six) hours as needed for allergies.   Yes Historical Provider, MD  ibuprofen (ADVIL,MOTRIN) 200 MG tablet Take 800 mg by mouth every 6 (six) hours as needed for moderate pain.   Yes Historical Provider, MD  lisinopril-hydrochlorothiazide (PRINZIDE,ZESTORETIC) 10-12.5 MG per tablet Take 1 tablet by mouth daily.   Yes Historical Provider, MD  ibuprofen (ADVIL,MOTRIN) 600 MG tablet Take 1 tablet (600 mg total) by mouth every 6 (  six) hours as needed. Patient not taking: Reported on 10/10/2014 08/26/14   Ashley Murrain, NP  methocarbamol (ROBAXIN) 500 MG tablet Take 1 tablet (500 mg total) by mouth 2 (two) times daily. 10/10/14   Marella Chimes, PA-C  oxyCODONE-acetaminophen (PERCOCET/ROXICET) 5-325 MG per tablet Take 1-2 tablets by mouth every 6 (six) hours as needed for severe pain. Patient not taking: Reported on 09/26/2014 09/23/14   Luvenia Redden, PA-C   BP 157/111 mmHg  Pulse 87  Temp(Src) 98.3 F (36.8 C) (Oral)  Resp 18  Ht 5\' 6"  (1.676 m)  Wt 390 lb (176.903 kg)  BMI 62.98 kg/m2  SpO2 100%  LMP 06/19/2014 Physical Exam  Constitutional: She is oriented to  person, place, and time.  Obese African American female lying in bed on left side.   HENT:  Head: Normocephalic and atraumatic.  Neck: Muscular tenderness present. No spinous process tenderness present. Decreased range of motion present.  Decreased range of motion of neck due to pain.   Cardiovascular: Normal rate and intact distal pulses.   Pulmonary/Chest: Effort normal and breath sounds normal.  Musculoskeletal: She exhibits tenderness.  Decreased range of motion of neck due to pain. TTP over left cervical and thoracic paraspinal muscles. No midline tenderness. No deformity or stepoff.   Neurological: She is alert and oriented to person, place, and time. She has normal strength.  Skin: Skin is warm and dry. No rash noted. She is not diaphoretic. No erythema.  Psychiatric: She has a normal mood and affect. Her behavior is normal.  Nursing note and vitals reviewed.   ED Course  Procedures (including critical care time) Labs Review Labs Reviewed - No data to display  Imaging Review No results found.   EKG Interpretation None      MDM   Final diagnoses:  Neck pain on left side  Trapezius muscle spasm  Left-sided thoracic back pain    Linda Sheppard is a 39 year old female who presents with tenderness over paraspinal muscles of left cervical and thoracic spine and over left trapezius, consistent with muscle strain. She has no history of injury or trauma. Doubt fracture. No imaging indicated at this time. She denies headache, visual changes, fever, or chills. No evidence of meningitis. She denies anticoagulant use, IVDU, or history of cancer. Strength and sensation of the left upper extremity are intact. Limited range of motion of neck due to pain. Will treat pain with percocet and muscle relaxant in the ED. Will discharge home with muscle relaxant and encourage continued ibuprofen use and home stretching. Follow-up with PCP in 2-3 days for further management.   Blood pressure  157/111, pulse 87, temperature 98.3 F (36.8 C), temperature source Oral, resp. rate 18, height 5\' 6"  (1.676 m), weight 390 lb (176.903 kg), last menstrual period 06/19/2014, SpO2 100 %.     Marella Chimes, PA-C 10/10/14 [id number]  Davonna Belling, MD 10/12/14 [phone]

## 2014-10-16 ENCOUNTER — Encounter (HOSPITAL_COMMUNITY): Payer: Self-pay | Admitting: Emergency Medicine

## 2014-10-16 ENCOUNTER — Emergency Department (HOSPITAL_COMMUNITY)
Admission: EM | Admit: 2014-10-16 | Discharge: 2014-10-16 | Disposition: A | Discharge status: Home / Self Care | Payer: Medicaid Other | Attending: Emergency Medicine | Admitting: Emergency Medicine

## 2014-10-16 DIAGNOSIS — Z720 Tobacco use: Secondary | ICD-10-CM | POA: Insufficient documentation

## 2014-10-16 DIAGNOSIS — M542 Cervicalgia: Secondary | ICD-10-CM | POA: Insufficient documentation

## 2014-10-16 DIAGNOSIS — K088 Other specified disorders of teeth and supporting structures: Secondary | ICD-10-CM | POA: Insufficient documentation

## 2014-10-16 DIAGNOSIS — I10 Essential (primary) hypertension: Secondary | ICD-10-CM | POA: Insufficient documentation

## 2014-10-16 DIAGNOSIS — K0889 Other specified disorders of teeth and supporting structures: Secondary | ICD-10-CM

## 2014-10-16 DIAGNOSIS — E669 Obesity, unspecified: Secondary | ICD-10-CM | POA: Diagnosis not present

## 2014-10-16 DIAGNOSIS — Z8659 Personal history of other mental and behavioral disorders: Secondary | ICD-10-CM | POA: Insufficient documentation

## 2014-10-16 DIAGNOSIS — R42 Dizziness and giddiness: Secondary | ICD-10-CM | POA: Diagnosis not present

## 2014-10-16 DIAGNOSIS — Z79899 Other long term (current) drug therapy: Secondary | ICD-10-CM | POA: Insufficient documentation

## 2014-10-16 DIAGNOSIS — R630 Anorexia: Secondary | ICD-10-CM | POA: Insufficient documentation

## 2014-10-16 MED ORDER — PENICILLIN V POTASSIUM 500 MG PO TABS: 500.0000 mg | ORAL_TABLET | Freq: Four times a day (QID) | ORAL | Status: DC

## 2014-10-16 MED ORDER — ACETAMINOPHEN 500 MG PO TABS
1000.0000 mg | ORAL_TABLET | Freq: Once | ORAL | Status: AC
  Administered 2014-10-16: 1000 mg via ORAL
  Filled 2014-10-16: qty 2

## 2014-10-16 NOTE — ED Provider Notes (Signed)
CSN: [id number]     Arrival date & time 10/16/14  1747 History  This chart was scribed for Linda Circle, PA-C, working with Merrily Pew, MD by Julien Nordmann, ED Scribe. This patient was seen in room [place] and the patient's care was started at 7:17 PM.    Chief Complaint  Patient presents with  . Dental Pain     The history is provided by the patient. No language interpreter was used.   HPI Comments: Linda Sheppard is a 39 y.o. female who presents to the Emergency Department complaining of multiple complaints.  Pt reports having dental pain associated with 2 abscesses in her mouth onset a few days ago. She has associated pain while eating, light-headedness, loss of appetite and head pain radiating from her mouth. Pt has been taking OTC ibuprofen and maximum strength oral gel to help alleviate the pain with no relief. She also reports having gradual improving neck pain. She was seen last week and received a muscle relaxer to alleviate the pain with no relief.  Past Medical History  Diagnosis Date  . Obesity   . Hypertension   . Polysubstance abuse   . Bipolar 1 disorder   . Schizophrenia   . Depression   . Anxiety    Past Surgical History  Procedure Laterality Date  . Tubal ligation    . Cesarean section    . Supracervical abdominal hysterectomy N/A 07/31/2014    Procedure: HYSTERECTOMY SUPRACERVICAL ABDOMINAL;  Surgeon: Emily Filbert, MD;  Location: [facility];  Service: Gynecology;  Laterality: N/A;  Requested 07/31/14 @ 10:00a with Nelwyn Salisbury first assist  . Dental surgery     Family History  Problem Relation Age of Onset  . Hypertension Mother   . Osteoarthritis Mother   . Osteoarthritis Father    History  Substance Use Topics  . Smoking status: Current Every Day Smoker -- 0.15 packs/day    Types: Cigarettes    Last Attempt to Quit: 10/18/2012  . Smokeless tobacco: Not on file  . Alcohol Use: Yes     Comment: 48 oz beer/sporadic   OB History    Gravida Para Term  Preterm AB TAB SAB Ectopic Multiple Living   5 3 1 2 1     2       Obstetric Comments   1- SIDS 1- Stillbirth 2 living children     Review of Systems  Constitutional: Positive for appetite change.  HENT: Positive for dental problem.   Musculoskeletal: Positive for neck pain.  Neurological: Positive for light-headedness.      Allergies  Ceftin; Meloxicam; and Tramadol  Home Medications   Prior to Admission medications   Medication Sig Start Date End Date Taking? Authorizing Provider  amLODipine (NORVASC) 10 MG tablet Take 1 tablet (10 mg total) by mouth daily. 08/24/13   Gregor Hams, MD  diphenhydrAMINE (BENADRYL) 25 mg capsule Take 25 mg by mouth every 6 (six) hours as needed for allergies.    Historical Provider, MD  ibuprofen (ADVIL,MOTRIN) 200 MG tablet Take 800 mg by mouth every 6 (six) hours as needed for moderate pain.    Historical Provider, MD  ibuprofen (ADVIL,MOTRIN) 600 MG tablet Take 1 tablet (600 mg total) by mouth every 6 (six) hours as needed. Patient not taking: Reported on 10/10/2014 08/26/14   Ashley Murrain, NP  lisinopril-hydrochlorothiazide (PRINZIDE,ZESTORETIC) 10-12.5 MG per tablet Take 1 tablet by mouth daily.    Historical Provider, MD  methocarbamol (ROBAXIN) 500 MG tablet Take 1  tablet (500 mg total) by mouth 2 (two) times daily. 10/10/14   Marella Chimes, PA-C  oxyCODONE-acetaminophen (PERCOCET/ROXICET) 5-325 MG per tablet Take 1-2 tablets by mouth every 6 (six) hours as needed for severe pain. Patient not taking: Reported on 09/26/2014 09/23/14   Luvenia Redden, PA-C   Triage vitals: BP 156/107 mmHg  Pulse 97  Temp(Src) 98.5 F (36.9 C) (Oral)  Resp 18  SpO2 98%  LMP 06/19/2014  Physical Exam  Constitutional: She is oriented to person, place, and time. She appears well-developed and well-nourished. No distress.  HENT:  Head: Normocephalic and atraumatic.  Mouth/Throat: Oropharynx is clear and moist.  Poor dentition throughout.  Affected tooth  as diagrammed.  No signs of peritonsillar or tonsillar abscess.  No signs of gingival abscess. Oropharynx is clear and without exudates.  Uvula is midline.  Airway is intact. No signs of Ludwig's angina with palpation of oral and sublingual mucosa.  Patient has two small aphthous ulcer  Eyes: Conjunctivae and EOM are normal. Right eye exhibits no discharge. Left eye exhibits no discharge. No scleral icterus.  Neck: Normal range of motion. Neck supple. No tracheal deviation present.  Cardiovascular: Normal rate, regular rhythm and normal heart sounds.  Exam reveals no gallop and no friction rub.   No murmur heard. Pulmonary/Chest: Effort normal and breath sounds normal. No respiratory distress. She has no wheezes.  Abdominal: Soft. She exhibits no distension. There is no tenderness.  Musculoskeletal: Normal range of motion. She exhibits no edema.  Cervical paraspinal muscles tender to palpation, no bony tenderness, step-offs, or gross abnormality or deformity of spine, patient is able to ambulate, moves all extremities    Neurological: She is alert and oriented to person, place, and time. No sensory deficit.  Sensation and strength intact bilaterally   Skin: Skin is warm and dry. She is not diaphoretic.  Psychiatric: She has a normal mood and affect. Her behavior is normal. Judgment and thought content normal.  Nursing note and vitals reviewed.   ED Course  Procedures  DIAGNOSTIC STUDIES: Oxygen Saturation is 98% on RA, normal by my interpretation.  COORDINATION OF CARE:  7:19 PM Discussed treatment plan which includes antibiotic, follow up with dentist, referral to spine specialist with pt at bedside and pt agreed to plan.  Labs Review Labs Reviewed - No data to display  Imaging Review No results found.   EKG Interpretation None      MDM   Final diagnoses:  Pain, dental  Neck pain    Patient with toothache.  No gross abscess.  Exam unconcerning for Ludwig's angina or  spread of infection.  Will treat with penicillin and OTC pain medicine.  Urged patient to follow-up with dentist.    Patient with back pain.  No neurological deficits and normal neuro exam.  Patient is ambulatory.  No loss of bowel or bladder control.  Doubt cauda equina.  Denies fever,  doubt epidural abscess or other lesion. Recommend back exercises, stretching, RICE.  Encouraged the patient that there could be a need for additional workup and/or imaging such as MRI, if the symptoms do not resolve. Patient advised that if the back pain does not resolve, or radiates, this could progress to more serious conditions and is encouraged to follow-up with PCP or orthopedics within 2 weeks.    I personally performed the services described in this documentation, which was scribed in my presence. The recorded information has been reviewed and is accurate.  Linda Circle, PA-C 10/16/14 1929  Merrily Pew, MD 10/17/14 1538

## 2014-10-16 NOTE — ED Notes (Signed)
Pt c/o dental pain and back pain, two small abscesses present in mouth.

## 2014-10-16 NOTE — Discharge Instructions (Signed)
Dental Pain A tooth ache may be caused by cavities (tooth decay). Cavities expose the nerve of the tooth to air and hot or cold temperatures. It may come from an infection or abscess (also called a boil or furuncle) around your tooth. It is also often caused by dental caries (tooth decay). This causes the pain you are having. DIAGNOSIS  Your caregiver can diagnose this problem by exam. TREATMENT   If caused by an infection, it may be treated with medications which kill germs (antibiotics) and pain medications as prescribed by your caregiver. Take medications as directed.  Only take over-the-counter or prescription medicines for pain, discomfort, or fever as directed by your caregiver.  Whether the tooth ache today is caused by infection or dental disease, you should see your dentist as soon as possible for further care. SEEK MEDICAL CARE IF: The exam and treatment you received today has been provided on an emergency basis only. This is not a substitute for complete medical or dental care. If your problem worsens or new problems (symptoms) appear, and you are unable to meet with your dentist, call or return to this location. SEEK IMMEDIATE MEDICAL CARE IF:   You have a fever.  You develop redness and swelling of your face, jaw, or neck.  You are unable to open your mouth.  You have severe pain uncontrolled by pain medicine. MAKE SURE YOU:   Understand these instructions.  Will watch your condition.  Will get help right away if you are not doing well or get worse. Document Released: 02/23/2005 Document Revised: 05/18/2011 Document Reviewed: 10/12/2007 [facility] Patient Information 2015 [place], [place]. This information is not intended to replace advice given to you by your health care provider. Make sure you discuss any questions you have with your health care provider. Cervical Radiculopathy Cervical radiculopathy happens when a nerve in the neck is pinched or bruised by a slipped  (herniated) disk or by arthritic changes in the bones of the cervical spine. This can occur due to an injury or as part of the normal aging process. Pressure on the cervical nerves can cause pain or numbness that runs from your neck all the way down into your arm and fingers. CAUSES  There are many possible causes, including:  Injury.  Muscle tightness in the neck from overuse.  Swollen, painful joints (arthritis).  Breakdown or degeneration in the bones and joints of the spine (spondylosis) due to aging.  Bone spurs that may develop near the cervical nerves. SYMPTOMS  Symptoms include pain, weakness, or numbness in the affected arm and hand. Pain can be severe or irritating. Symptoms may be worse when extending or turning the neck. DIAGNOSIS  Your caregiver will ask about your symptoms and do a physical exam. He or she may test your strength and reflexes. X-rays, CT scans, and MRI scans may be needed in cases of injury or if the symptoms do not go away after a period of time. Electromyography (EMG) or nerve conduction testing may be done to study how your nerves and muscles are working. TREATMENT  Your caregiver may recommend certain exercises to help relieve your symptoms. Cervical radiculopathy can, and often does, get better with time and treatment. If your problems continue, treatment options may include:  Wearing a soft collar for short periods of time.  Physical therapy to strengthen the neck muscles.  Medicines, such as nonsteroidal anti-inflammatory drugs (NSAIDs), oral corticosteroids, or spinal injections.  Surgery. Different types of surgery may be done depending on  the cause of your problems. HOME CARE INSTRUCTIONS   Put ice on the affected area.  Put ice in a plastic bag.  Place a towel between your skin and the bag.  Leave the ice on for 15-20 minutes, 03-04 times a day or as directed by your caregiver.  If ice does not help, you can try using heat. Take a warm  shower or bath, or use a hot water bottle as directed by your caregiver.  You may try a gentle neck and shoulder massage.  Use a flat pillow when you sleep.  Only take over-the-counter or prescription medicines for pain, discomfort, or fever as directed by your caregiver.  If physical therapy was prescribed, follow your caregiver's directions.  If a soft collar was prescribed, use it as directed. SEEK IMMEDIATE MEDICAL CARE IF:   Your pain gets much worse and cannot be controlled with medicines.  You have weakness or numbness in your hand, arm, face, or leg.  You have a high fever or a stiff, rigid neck.  You lose bowel or bladder control (incontinence).  You have trouble with walking, balance, or speaking. MAKE SURE YOU:   Understand these instructions.  Will watch your condition.  Will get help right away if you are not doing well or get worse. Document Released: 11/18/2000 Document Revised: 05/18/2011 Document Reviewed: 10/07/2010 [facility] Patient Information 2015 [place], [place]. This information is not intended to replace advice given to you by your health care provider. Make sure you discuss any questions you have with your health care provider.

## 2014-10-21 ENCOUNTER — Emergency Department (INDEPENDENT_AMBULATORY_CARE_PROVIDER_SITE_OTHER)
Admission: EM | Admit: 2014-10-21 | Discharge: 2014-10-21 | Disposition: A | Discharge status: Home / Self Care | Payer: Medicaid Other | Source: Home / Self Care | Attending: Emergency Medicine | Admitting: Emergency Medicine

## 2014-10-21 ENCOUNTER — Encounter (HOSPITAL_COMMUNITY): Payer: Self-pay | Admitting: Emergency Medicine

## 2014-10-21 DIAGNOSIS — K047 Periapical abscess without sinus: Secondary | ICD-10-CM

## 2014-10-21 MED ORDER — OXYCODONE-ACETAMINOPHEN 5-325 MG PO TABS: 1.0000 | ORAL_TABLET | Freq: Four times a day (QID) | ORAL | Status: DC | PRN

## 2014-10-21 MED ORDER — CLINDAMYCIN HCL 300 MG PO CAPS: 300.0000 mg | ORAL_CAPSULE | Freq: Three times a day (TID) | ORAL | Status: DC

## 2014-10-21 MED ORDER — IBUPROFEN 800 MG PO TABS: 800.0000 mg | ORAL_TABLET | Freq: Three times a day (TID) | ORAL | Status: DC | PRN

## 2014-10-21 NOTE — Discharge Instructions (Signed)
Abscessed Tooth An abscessed tooth is an infection around your tooth. It may be caused by holes or damage to the tooth (cavity) or a dental disease. An abscessed tooth causes mild to very bad pain in and around the tooth. See your dentist right away if you have tooth or gum pain. HOME CARE  Take your medicine as told. Finish it even if you start to feel better.  Do not drive after taking pain medicine.  Rinse your mouth (gargle) often with salt water ( teaspoon salt in 8 ounces of warm water).  Do not apply heat to the outside of your face. GET HELP RIGHT AWAY IF:   You have a temperature by mouth above 102 F (38.9 C), not controlled by medicine.  You have chills and a very bad headache.  You have problems breathing or swallowing.  Your mouth will not open.  You develop puffiness (swelling) on the neck or around the eye.  Your pain is not helped by medicine.  Your pain is getting worse instead of better. MAKE SURE YOU:   Understand these instructions.  Will watch your condition.  Will get help right away if you are not doing well or get worse. Document Released: 08/12/2007 Document Revised: 05/18/2011 Document Reviewed: 06/03/2010 [employer] Patient Information 2015 [employer]. This information is not intended to replace advice given to you by your health care provider. Make sure you discuss any questions you have with your health care provider.  

## 2014-10-21 NOTE — ED Provider Notes (Signed)
CSN: [id number]     Arrival date & time 10/21/14  1910 History   First MD Initiated Contact with Patient 10/21/14 1915     Chief Complaint  Patient presents with  . Dental Pain   (Consider location/radiation/quality/duration/timing/severity/associated sxs/prior Treatment) HPI  She is a 39 year old woman here for dental pain. She reports a one-week history of dental abscesses. She is seen at the [facility] long emergency room a few days ago and prescribed penicillin. She has been taking the penicillin without any improvement. She denies any fevers or chills. No nausea or vomiting. She has been taking ibuprofen and Tylenol without improvement of the pain. She has an appointment with the dentist on Friday.  Past Medical History  Diagnosis Date  . Obesity   . Hypertension   . Polysubstance abuse   . Bipolar 1 disorder   . Schizophrenia   . Depression   . Anxiety    Past Surgical History  Procedure Laterality Date  . Tubal ligation    . Cesarean section    . Supracervical abdominal hysterectomy N/A 07/31/2014    Procedure: HYSTERECTOMY SUPRACERVICAL ABDOMINAL;  Surgeon: Emily Filbert, MD;  Location: [facility];  Service: Gynecology;  Laterality: N/A;  Requested 07/31/14 @ 10:00a with Nelwyn Salisbury first assist  . Dental surgery     Family History  Problem Relation Age of Onset  . Hypertension Mother   . Osteoarthritis Mother   . Osteoarthritis Father    Social History  Substance Use Topics  . Smoking status: Current Every Day Smoker -- 0.15 packs/day    Types: Cigarettes    Last Attempt to Quit: 10/18/2012  . Smokeless tobacco: None  . Alcohol Use: Yes     Comment: 48 oz beer/sporadic   OB History    Gravida Para Term Preterm AB TAB SAB Ectopic Multiple Living   5 3 1 2 1     2       Obstetric Comments   1- SIDS 1- Stillbirth 2 living children     Review of Systems As in history of present illness Allergies  Ceftin; Meloxicam; and Tramadol  Home Medications   Prior to Admission  medications   Medication Sig Start Date End Date Taking? Authorizing Provider  amLODipine (NORVASC) 10 MG tablet Take 1 tablet (10 mg total) by mouth daily. 08/24/13  Yes Gregor Hams, MD  diphenhydrAMINE (BENADRYL) 25 mg capsule Take 25 mg by mouth every 6 (six) hours as needed for allergies.   Yes Historical Provider, MD  lisinopril-hydrochlorothiazide (PRINZIDE,ZESTORETIC) 10-12.5 MG per tablet Take 1 tablet by mouth daily.   Yes Historical Provider, MD  clindamycin (CLEOCIN) 300 MG capsule Take 1 capsule (300 mg total) by mouth 3 (three) times daily. 10/21/14   Melony Overly, MD  ibuprofen (ADVIL,MOTRIN) 800 MG tablet Take 1 tablet (800 mg total) by mouth every 8 (eight) hours as needed for moderate pain. 10/21/14   Melony Overly, MD  methocarbamol (ROBAXIN) 500 MG tablet Take 1 tablet (500 mg total) by mouth 2 (two) times daily. 10/10/14   Marella Chimes, PA-C  oxyCODONE-acetaminophen (PERCOCET/ROXICET) 5-325 MG per tablet Take 1 tablet by mouth every 6 (six) hours as needed for severe pain. 10/21/14   Melony Overly, MD   BP 154/103 mmHg  Pulse 74  Temp(Src) 98.2 F (36.8 C) (Oral)  Resp 22  SpO2 100%  LMP 06/19/2014 Physical Exam  Constitutional: She is oriented to person, place, and time. She appears well-developed and well-nourished. No distress.  HENT:  She has an area of erythema and induration at the gumline of the lower right second incisor. She also has some tenderness in the right upper gumline.  Cardiovascular: Normal rate.   Pulmonary/Chest: Effort normal.  Neurological: She is alert and oriented to person, place, and time.    ED Course  Procedures (including critical care time) Labs Review Labs Reviewed - No data to display  Imaging Review No results found.   MDM   1. Dental infection    Will change antibiotics to clindamycin. Prescription for 800 mg ibuprofen given. Discussed pain medication. On review of the data base she received Percocet 10-325 mg, #90  tablets on July 25. She states she does not have this medication anymore because she was evicted from her home and did not have time to gather all of her belongings.  Discussed that I would be able to provide 10 tablets of Percocet 5-325 mg. Follow-up with dentist as scheduled.   Melony Overly, MD 10/21/14 1946

## 2014-10-21 NOTE — ED Notes (Signed)
C/o dental pain onset 1 week Reports she was seen at [facility] ED on 8/9; Given Penicillin w/no relief Has appt w/dentist this Friday Alert... No acute distress.

## 2014-10-22 NOTE — ED Notes (Signed)
Patient called, states she had a problem w her Rx, and needed to have [place] okay her having it filled. States she had been evicted from her apartment a couple of days ago, and she had to leave her old pain Rx behind. Since it was not time for her to have used up her old pain Rx , the pharmacy would not let her get he new one filled. Discussed w pharmacy, and they will go ahead & fill the new pain Rx . Patient was advised , and will be sure to go to her dental appointment.

## 2014-12-22 ENCOUNTER — Encounter (HOSPITAL_COMMUNITY): Payer: Self-pay | Admitting: Emergency Medicine

## 2014-12-22 ENCOUNTER — Emergency Department (HOSPITAL_COMMUNITY)
Admission: EM | Admit: 2014-12-22 | Discharge: 2014-12-22 | Disposition: A | Discharge status: Home / Self Care | Payer: Medicaid Other | Attending: Emergency Medicine | Admitting: Emergency Medicine

## 2014-12-22 DIAGNOSIS — Z720 Tobacco use: Secondary | ICD-10-CM | POA: Diagnosis not present

## 2014-12-22 DIAGNOSIS — Z8659 Personal history of other mental and behavioral disorders: Secondary | ICD-10-CM | POA: Insufficient documentation

## 2014-12-22 DIAGNOSIS — Z79899 Other long term (current) drug therapy: Secondary | ICD-10-CM | POA: Insufficient documentation

## 2014-12-22 DIAGNOSIS — I10 Essential (primary) hypertension: Secondary | ICD-10-CM | POA: Insufficient documentation

## 2014-12-22 DIAGNOSIS — E669 Obesity, unspecified: Secondary | ICD-10-CM | POA: Diagnosis not present

## 2014-12-22 DIAGNOSIS — J029 Acute pharyngitis, unspecified: Secondary | ICD-10-CM | POA: Insufficient documentation

## 2014-12-22 LAB — RAPID STREP SCREEN (MED CTR MEBANE ONLY): Streptococcus, Group A Screen (Direct): NEGATIVE

## 2014-12-22 MED ORDER — PENICILLIN V POTASSIUM 500 MG PO TABS: 500.0000 mg | ORAL_TABLET | Freq: Four times a day (QID) | ORAL | Status: DC

## 2014-12-22 MED ORDER — PENICILLIN V POTASSIUM 500 MG PO TABS
500.0000 mg | ORAL_TABLET | Freq: Once | ORAL | Status: AC
  Administered 2014-12-22: 500 mg via ORAL
  Filled 2014-12-22: qty 1

## 2014-12-22 MED ORDER — IBUPROFEN 800 MG PO TABS
800.0000 mg | ORAL_TABLET | Freq: Once | ORAL | Status: AC
  Administered 2014-12-22: 800 mg via ORAL
  Filled 2014-12-22: qty 1

## 2014-12-22 MED ORDER — NAPROXEN 500 MG PO TABS: 500.0000 mg | ORAL_TABLET | Freq: Two times a day (BID) | ORAL | Status: DC

## 2014-12-22 NOTE — Discharge Instructions (Signed)

## 2014-12-22 NOTE — ED Provider Notes (Signed)
CSN: [id number]     Arrival date & time 12/22/14  0459 History   First MD Initiated Contact with Patient 12/22/14 0550     Chief Complaint  Patient presents with  . Sore Throat     HPI  History initial evaluation of sore throat. Painful S3 to 4 hours. States like she can barely swallow because of discomfort.  Primarily right-sided. Cough no fevers no chills no URI or sinus symptoms.  Past Medical History  Diagnosis Date  . Obesity   . Hypertension   . Polysubstance abuse   . Bipolar 1 disorder ([place])   . Schizophrenia ([place])   . Depression   . Anxiety    Past Surgical History  Procedure Laterality Date  . Tubal ligation    . Cesarean section    . Supracervical abdominal hysterectomy N/A 07/31/2014    Procedure: HYSTERECTOMY SUPRACERVICAL ABDOMINAL;  Surgeon: Emily Filbert, MD;  Location: [facility];  Service: Gynecology;  Laterality: N/A;  Requested 07/31/14 @ 10:00a with Nelwyn Salisbury first assist  . Dental surgery     Family History  Problem Relation Age of Onset  . Hypertension Mother   . Osteoarthritis Mother   . Osteoarthritis Father    Social History  Substance Use Topics  . Smoking status: Current Every Day Smoker -- 0.20 packs/day    Types: Cigarettes    Last Attempt to Quit: 10/18/2012  . Smokeless tobacco: None  . Alcohol Use: Yes     Comment: 48 oz beer/sporadic   OB History    Gravida Para Term Preterm AB TAB SAB Ectopic Multiple Living   5 3 1 2 1     2       Obstetric Comments   1- SIDS 1- Stillbirth 2 living children     Review of Systems  Constitutional: Negative for fever, chills, diaphoresis, appetite change and fatigue.  HENT: Positive for sore throat. Negative for mouth sores and trouble swallowing.   Eyes: Negative for visual disturbance.  Respiratory: Negative for cough, chest tightness, shortness of breath and wheezing.   Cardiovascular: Negative for chest pain.  Gastrointestinal: Negative for nausea, vomiting, abdominal pain, diarrhea and abdominal  distention.  Endocrine: Negative for polydipsia, polyphagia and polyuria.  Genitourinary: Negative for dysuria, frequency and hematuria.  Musculoskeletal: Negative for gait problem.  Skin: Negative for color change, pallor and rash.  Neurological: Negative for dizziness, syncope, light-headedness and headaches.  Hematological: Does not bruise/bleed easily.  Psychiatric/Behavioral: Negative for behavioral problems and confusion.      Allergies  Ceftin; Meloxicam; and Tramadol  Home Medications   Prior to Admission medications   Medication Sig Start Date End Date Taking? Authorizing Provider  amLODipine (NORVASC) 10 MG tablet Take 1 tablet (10 mg total) by mouth daily. 08/24/13  Yes Gregor Hams, MD  diphenhydrAMINE (BENADRYL) 25 mg capsule Take 25 mg by mouth every 6 (six) hours as needed for allergies.   Yes Historical Provider, MD  ibuprofen (ADVIL,MOTRIN) 800 MG tablet Take 1 tablet (800 mg total) by mouth every 8 (eight) hours as needed for moderate pain. 10/21/14  Yes Melony Overly, MD  lisinopril-hydrochlorothiazide (PRINZIDE,ZESTORETIC) 10-12.5 MG per tablet Take 1 tablet by mouth daily.   Yes Historical Provider, MD  clindamycin (CLEOCIN) 300 MG capsule Take 1 capsule (300 mg total) by mouth 3 (three) times daily. Patient not taking: Reported on 12/22/2014 10/21/14   Melony Overly, MD  methocarbamol (ROBAXIN) 500 MG tablet Take 1 tablet (500 mg total) by mouth 2 (  two) times daily. Patient not taking: Reported on 12/22/2014 10/10/14   Marella Chimes, PA-C  naproxen (NAPROSYN) 500 MG tablet Take 1 tablet (500 mg total) by mouth 2 (two) times daily. 12/22/14   Tanna Furry, MD  oxyCODONE-acetaminophen (PERCOCET/ROXICET) 5-325 MG per tablet Take 1 tablet by mouth every 6 (six) hours as needed for severe pain. Patient not taking: Reported on 12/22/2014 10/21/14   Melony Overly, MD  penicillin v potassium (VEETID) 500 MG tablet Take 1 tablet (500 mg total) by mouth 4 (four) times daily.  12/22/14   Tanna Furry, MD   BP 174/113 mmHg  Pulse 92  Temp(Src) 98.3 F (36.8 C) (Oral)  Resp 16  SpO2 99%  LMP 06/19/2014 Physical Exam  Constitutional: She is oriented to person, place, and time. She appears well-developed and well-nourished. No distress.  HENT:  Head: Normocephalic.  Mouth/Throat:    Eyes: Conjunctivae are normal. Pupils are equal, round, and reactive to light. No scleral icterus.  Neck: Normal range of motion. Neck supple. No thyromegaly present.  Cardiovascular: Normal rate and regular rhythm.  Exam reveals no gallop and no friction rub.   No murmur heard. Pulmonary/Chest: Effort normal and breath sounds normal. No respiratory distress. She has no wheezes. She has no rales.  Abdominal: Soft. Bowel sounds are normal. She exhibits no distension. There is no tenderness. There is no rebound.  Musculoskeletal: Normal range of motion.  Neurological: She is alert and oriented to person, place, and time.  Skin: Skin is warm and dry. No rash noted.  Psychiatric: She has a normal mood and affect. Her behavior is normal.    ED Course  Procedures (including critical care time) Labs Review Labs Reviewed  RAPID STREP SCREEN (NOT AT [facility])    Imaging Review No results found. I have personally reviewed and evaluated these images and lab results as part of my medical decision-making.   EKG Interpretation None      MDM   Final diagnoses:  Pharyngitis   Given penicillin and Motrin for discharge home. Prescription for penicillin.    Tanna Furry, MD 12/22/14 [phone]

## 2014-12-22 NOTE — ED Notes (Signed)
Patient c/o sore throat with difficulty swallowing, onset 3 hours ago. Patient took 3-200mg  ibuprofen @0345 , states it is not helping. Denies fever.

## 2014-12-24 LAB — CULTURE, GROUP A STREP: STREP A CULTURE: NEGATIVE

## 2016-01-10 ENCOUNTER — Other Ambulatory Visit: Payer: Self-pay | Admitting: Sports Medicine

## 2016-01-10 DIAGNOSIS — M545 Low back pain, unspecified: Secondary | ICD-10-CM

## 2016-01-22 ENCOUNTER — Ambulatory Visit
Admission: RE | Admit: 2016-01-22 | Discharge: 2016-01-22 | Disposition: A | Discharge status: Home / Self Care | Payer: Medicaid Other | Source: Ambulatory Visit | Attending: Sports Medicine | Admitting: Sports Medicine

## 2016-01-22 DIAGNOSIS — M545 Low back pain, unspecified: Secondary | ICD-10-CM

## 2016-03-08 IMAGING — US US PELVIS COMPLETE
1 series · 14 of 25 positions shown · non-contrast
Comparison: CT abdomen pelvis dated 05/20/2014

CLINICAL DATA: Pelvic pain, left pelvic tenderness, known uterine
fibroids

EXAM:
TRANSABDOMINAL AND TRANSVAGINAL ULTRASOUND OF PELVIS
TECHNIQUE: Both transabdominal and transvaginal ultrasound examinations of the
pelvis were performed. Transabdominal technique was performed for
global imaging of the pelvis including uterus, ovaries, adnexal
regions, and pelvic cul-de-sac. It was necessary to proceed with
endovaginal exam following the transabdominal exam to visualize the
right ovary.

[Series 1: us pelvis complete · 14 of 91 slices shown]
[im 1/91]
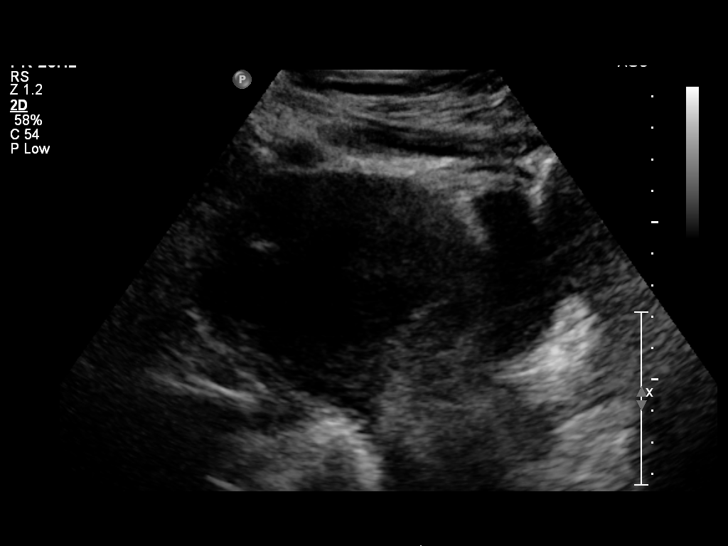
[im 8/91]
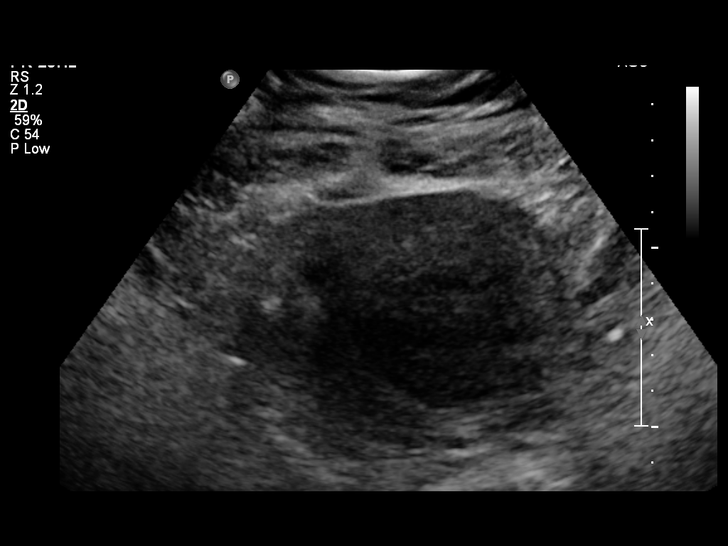
[im 16/91]
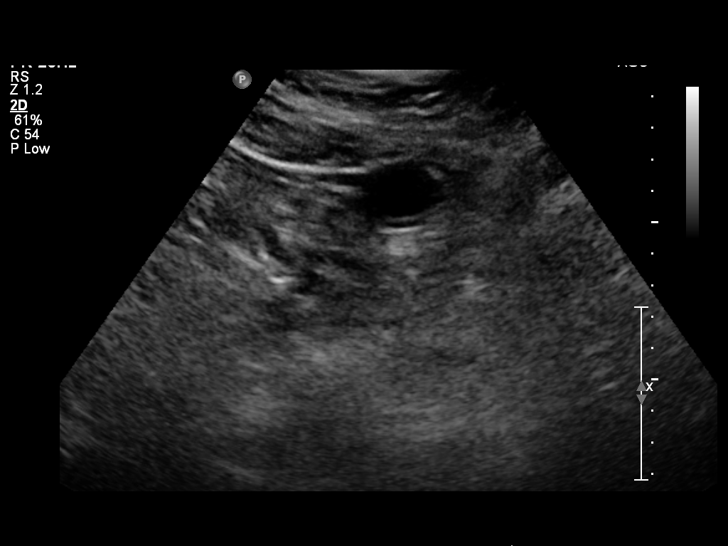
[im 23/91]
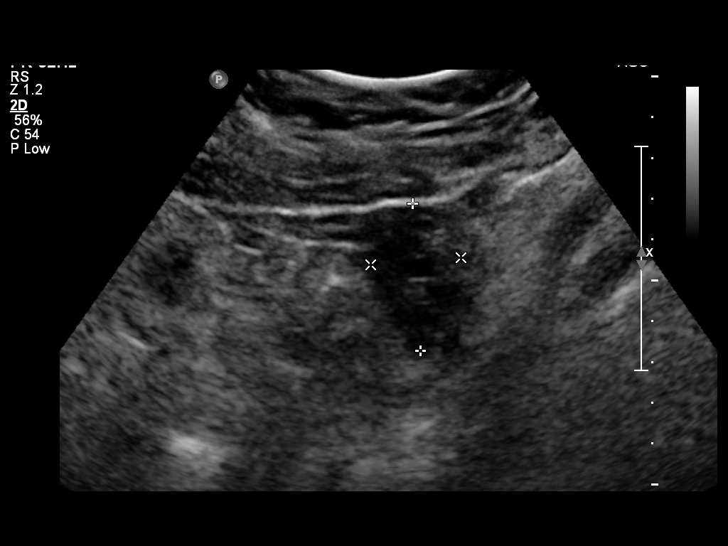
[im 31/91]
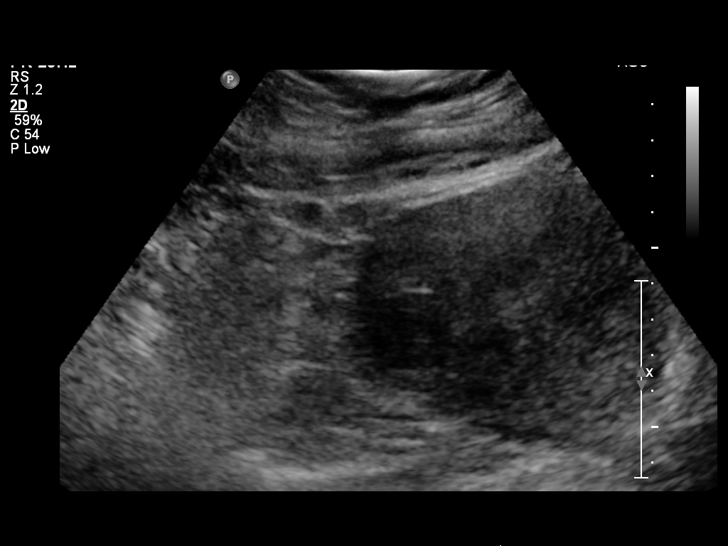
[im 34/91]
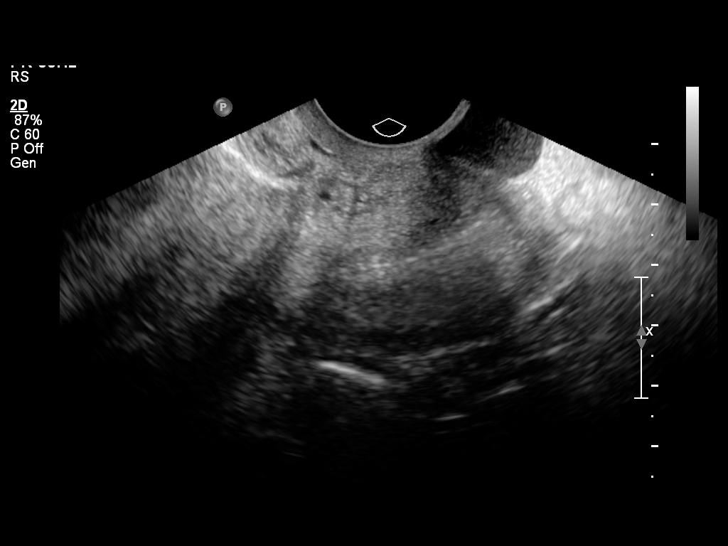
[im 42/91]
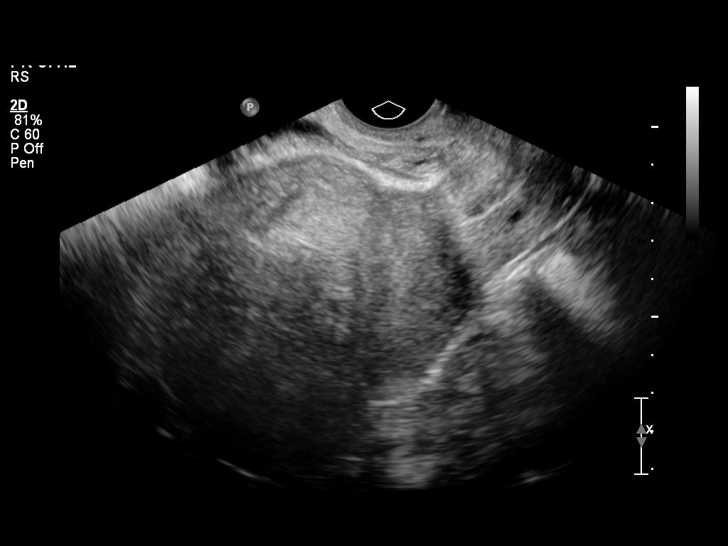
[im 49/91]
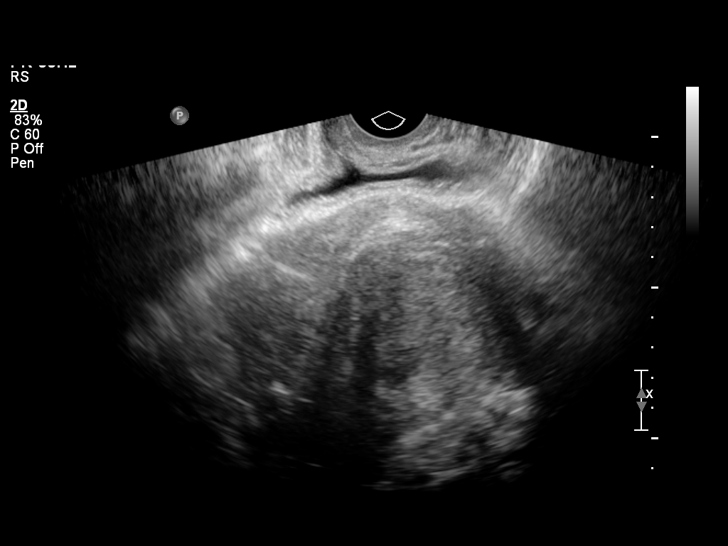
[im 57/91]
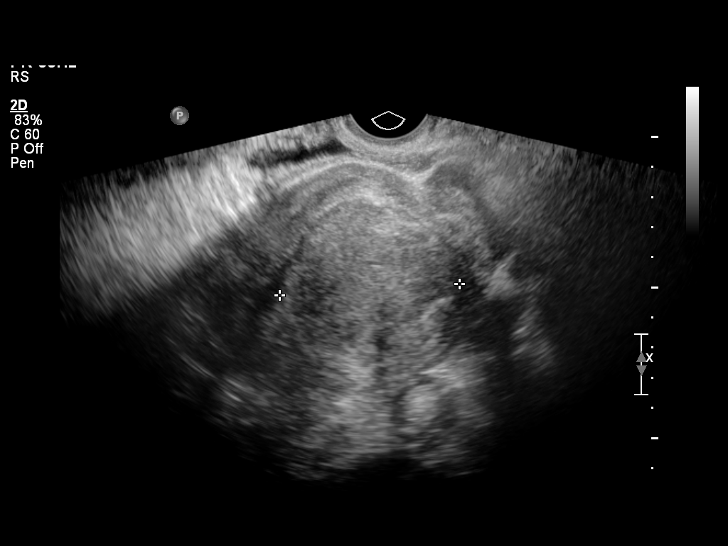
[im 61/91]
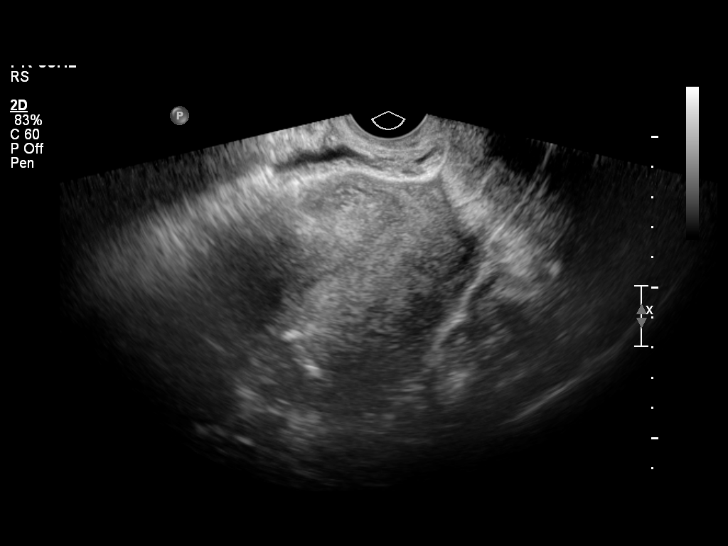
[im 68/91]
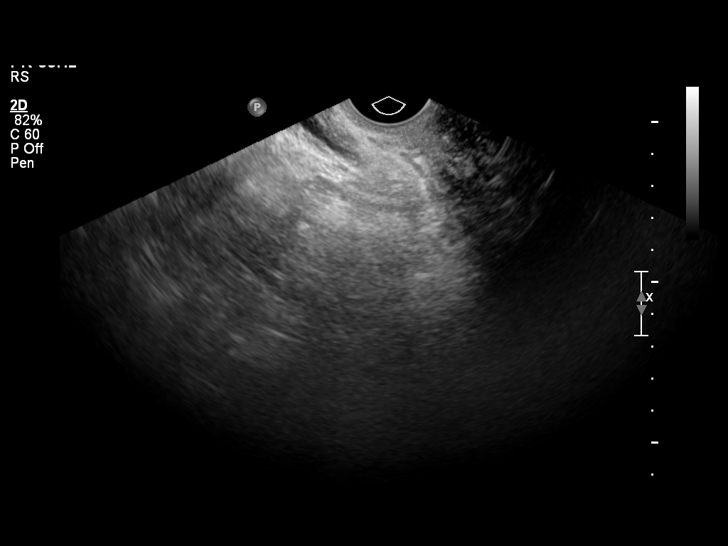
[im 76/91]
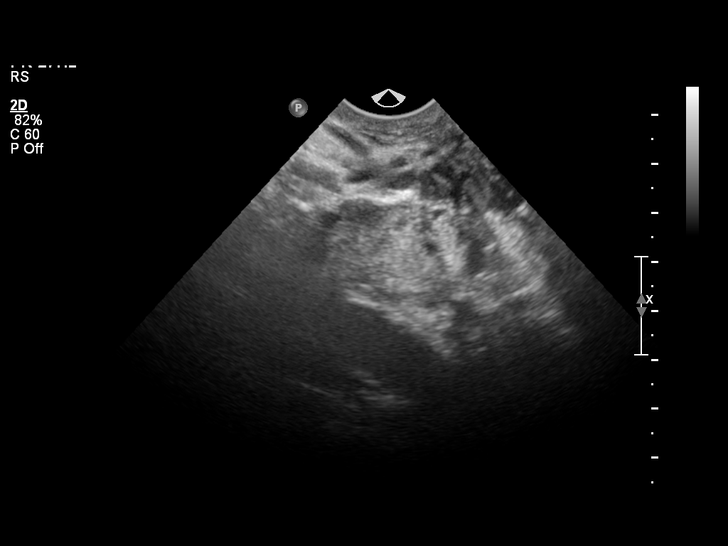
[im 83/91]
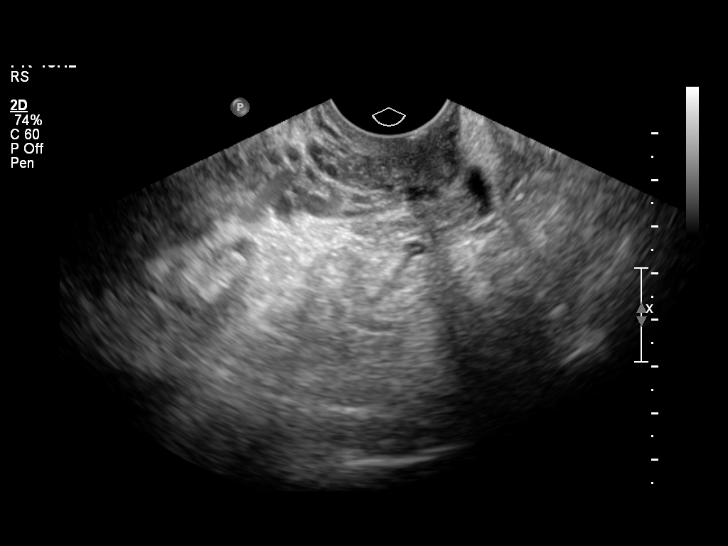
[im 91/91]
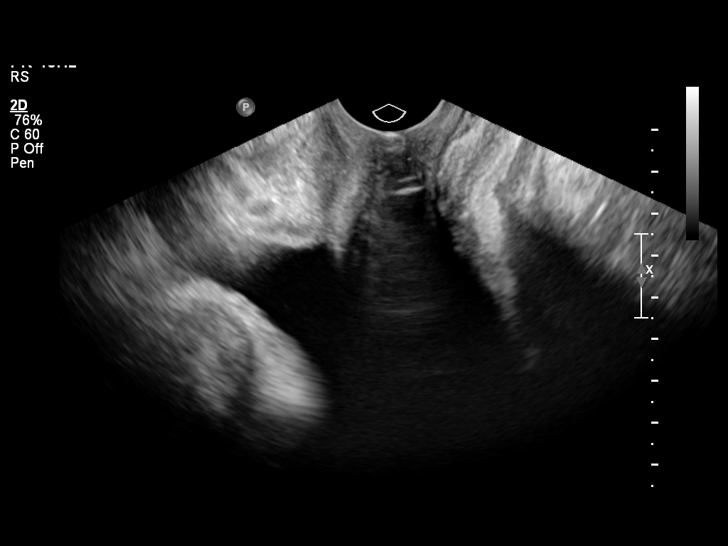

[14 of 25 positions shown; findings below may reference images not displayed]

FINDINGS: Uterus

Measurements: 13.0 x 6.9 x 9.2 cm. 6.1 x 5.8 x 6.0 cm
intramural/submucosal left anterior fundal fibroid. 2.0 x 1.8 x
cm subserosal left anterior uterine body fibroid.

Endometrium

Thickness: 10 mm.  Displaced/distorted by dominant uterine fibroid.

Right ovary

Measurements: 3.4 x 1.8 x 1.7 cm. Normal appearance/no adnexal mass.

Left ovary

Measurements: 3.6 x 2.2 x 3.8 cm. Normal appearance/no adnexal mass.

Other findings

No free fluid.
IMPRESSION: Two dominant uterine fibroids, measuring up to 6.1 cm.

Endometrial complex measures 10 mm.

Bilateral ovaries are within normal limits.

## 2016-04-13 ENCOUNTER — Encounter (HOSPITAL_COMMUNITY): Payer: Self-pay | Admitting: Emergency Medicine

## 2016-04-13 ENCOUNTER — Emergency Department (HOSPITAL_COMMUNITY): Payer: Medicaid Other

## 2016-04-13 ENCOUNTER — Emergency Department (HOSPITAL_COMMUNITY)
Admission: EM | Admit: 2016-04-13 | Discharge: 2016-04-13 | Disposition: A | Discharge status: Home / Self Care | Payer: Medicaid Other | Attending: Emergency Medicine | Admitting: Emergency Medicine

## 2016-04-13 DIAGNOSIS — I10 Essential (primary) hypertension: Secondary | ICD-10-CM | POA: Diagnosis not present

## 2016-04-13 DIAGNOSIS — R0600 Dyspnea, unspecified: Secondary | ICD-10-CM | POA: Diagnosis not present

## 2016-04-13 DIAGNOSIS — D1722 Benign lipomatous neoplasm of skin and subcutaneous tissue of left arm: Secondary | ICD-10-CM | POA: Insufficient documentation

## 2016-04-13 DIAGNOSIS — Z79899 Other long term (current) drug therapy: Secondary | ICD-10-CM | POA: Diagnosis not present

## 2016-04-13 DIAGNOSIS — M549 Dorsalgia, unspecified: Secondary | ICD-10-CM

## 2016-04-13 DIAGNOSIS — F17210 Nicotine dependence, cigarettes, uncomplicated: Secondary | ICD-10-CM | POA: Diagnosis not present

## 2016-04-13 DIAGNOSIS — R0602 Shortness of breath: Secondary | ICD-10-CM | POA: Diagnosis present

## 2016-04-13 DIAGNOSIS — G8929 Other chronic pain: Secondary | ICD-10-CM

## 2016-04-13 DIAGNOSIS — M545 Low back pain: Secondary | ICD-10-CM | POA: Insufficient documentation

## 2016-04-13 LAB — BASIC METABOLIC PANEL
Anion gap: 8 (ref 5–15)
BUN: 9 mg/dL (ref 6–20)
CO2: 25 mmol/L (ref 22–32)
Calcium: 8.9 mg/dL (ref 8.9–10.3)
Chloride: 104 mmol/L (ref 101–111)
Creatinine, Ser: 0.71 mg/dL (ref 0.44–1.00)
GFR calc Af Amer: >60 mL/min (ref >60)
GFR calc non Af Amer: >60 mL/min (ref >60)
Glucose, Bld: 96 mg/dL (ref 65–99)
Potassium: 3.8 mmol/L (ref 3.5–5.1)
Sodium: 137 mmol/L (ref 135–145)

## 2016-04-13 LAB — CBC WITH DIFFERENTIAL/PLATELET
Basophils Absolute: 0 10*3/uL (ref 0.0–0.1)
Basophils Relative: 0 %
Eosinophils Absolute: 0.1 10*3/uL (ref 0.0–0.7)
Eosinophils Relative: 1 %
HCT: 34.8 % — ABNORMAL LOW (ref 36.0–46.0)
Hemoglobin: 11.8 g/dL — ABNORMAL LOW (ref 12.0–15.0)
Lymphocytes Relative: 22 %
Lymphs Abs: 2.7 10*3/uL (ref 0.7–4.0)
MCH: 25 pg — ABNORMAL LOW (ref 26.0–34.0)
MCHC: 33.9 g/dL (ref 30.0–36.0)
MCV: 73.7 fL — ABNORMAL LOW (ref 78.0–100.0)
Monocytes Absolute: 1.1 10*3/uL — ABNORMAL HIGH (ref 0.1–1.0)
Monocytes Relative: 9 %
Neutro Abs: 8.2 10*3/uL — ABNORMAL HIGH (ref 1.7–7.7)
Neutrophils Relative %: 68 %
Platelets: 306 10*3/uL (ref 150–400)
RBC: 4.72 MIL/uL (ref 3.87–5.11)
RDW: 14.9 % (ref 11.5–15.5)
WBC: 12.1 10*3/uL — ABNORMAL HIGH (ref 4.0–10.5)

## 2016-04-13 LAB — TROPONIN I: Troponin I: <0.03 ng/mL (ref <0.03)

## 2016-04-13 MED ORDER — HYDROMORPHONE HCL 1 MG/ML IJ SOLN
1.0000 mg | Freq: Once | INTRAMUSCULAR | Status: AC
  Administered 2016-04-13: 1 mg via INTRAMUSCULAR
  Filled 2016-04-13: qty 1

## 2016-04-13 NOTE — ED Notes (Signed)
Patient was alert, oriented and stable upon discharge. RN went over AVS and patient had no further questions.  

## 2016-04-13 NOTE — ED Triage Notes (Signed)
Pt complaint of SOB and lower back pain worsening last night; pt hx of bulging discs; lungs sounds clear.

## 2016-04-13 NOTE — ED Provider Notes (Signed)
[facility] DEPT Provider Note   CSN: [id number] Arrival date & time: 04/13/16  1221   By signing my name below, I, Evelene Croon, attest that this documentation has been prepared under the direction and in the presence of Virgel Manifold, MD .  Electronically Signed: Evelene Croon, Scribe. 04/13/2016. 3:02 PM.  History   Chief Complaint Chief Complaint  Patient presents with  . Shortness of Breath    The history is provided by the patient. No language interpreter was used.    HPI Comments:  Linda Sheppard is a 41 y.o. obese female with a history of anemia,  who presents to the Emergency Department complaining of worsening SOB with exertion x several months. Her breathing is better after rest.  No change in breathing when supine. She also notes burning, throbbing lower back pain; states she was recently diagnosed with a bulging disc in her lumbar back. Her pain is exacerbated with movement. No alleviating factors noted.  No use of hormone replacement therapy or BCP. She denies cough, fever, and weakness. She also notes swelling to her BLE a few days ago that improved after she decided to take a double dose of her lisinopril and a lump to the right shoulder which she first noticed ~ 1 year ago, states it has gotten larger since onset and has been assessed by her PCP. Pt is a current smoker.   Past Medical History:  Diagnosis Date  . Anxiety   . Bipolar 1 disorder ([place])   . Depression   . Hypertension   . Obesity   . Polysubstance abuse   . Schizophrenia [facility])     Patient Active Problem List   Diagnosis Date Noted  . Wound infection after surgery 08/11/2014  . Post-operative state 07/31/2014  . Diverticulosis 07/02/2014  . Intramural leiomyoma of uterus 07/02/2014  . Pelvic pain in female 07/02/2014  . Morbid obesity ([place]) 07/02/2014  . Diverticulosis of large intestine without hemorrhage 07/02/2014  . Fibroid uterus, with degeneration 05/25/2014  . Alcohol abuse 10/05/2012   . Cocaine abuse 10/05/2012  . Chronic back pain greater than 3 months duration 07/16/2012  . Major depressive disorder, recurrent episode, moderate ([place]) 06/20/2012    Past Surgical History:  Procedure Laterality Date  . CESAREAN SECTION    . DENTAL SURGERY    . SUPRACERVICAL ABDOMINAL HYSTERECTOMY N/A 07/31/2014   Procedure: HYSTERECTOMY SUPRACERVICAL ABDOMINAL;  Surgeon: Emily Filbert, MD;  Location: [facility];  Service: Gynecology;  Laterality: N/A;  Requested 07/31/14 @ 10:00a with Nelwyn Salisbury first assist  . TUBAL LIGATION      OB History    Gravida Para Term Preterm AB Living   5 3 1 2 1 2    SAB TAB Ectopic Multiple Live Births           3      Obstetric Comments   1- SIDS 1- Stillbirth 2 living children       Home Medications    Prior to Admission medications   Medication Sig Start Date End Date Taking? Authorizing Provider  amLODipine (NORVASC) 10 MG tablet Take 1 tablet (10 mg total) by mouth daily. Patient taking differently: Take 10 mg by mouth daily after supper.  08/24/13  Yes Gregor Hams, MD  diclofenac (VOLTAREN) 75 MG EC tablet Take 75 mg by mouth 2 (two) times daily as needed for mild pain.   Yes Historical Provider, MD  lisinopril-hydrochlorothiazide (PRINZIDE,ZESTORETIC) 10-12.5 MG per tablet Take 1 tablet by mouth daily.  Yes Historical Provider, MD  naproxen (NAPROSYN) 375 MG tablet Take 375 mg by mouth 2 (two) times daily as needed for mild pain.   Yes Historical Provider, MD  oxyCODONE (ROXICODONE) 15 MG immediate release tablet Take 15 mg by mouth every 4 (four) hours.   Yes Historical Provider, MD    Family History Family History  Problem Relation Age of Onset  . Hypertension Mother   . Osteoarthritis Mother   . Osteoarthritis Father     Social History Social History  Substance Use Topics  . Smoking status: Current Every Day Smoker    Packs/day: 0.20    Types: Cigarettes    Last attempt to quit: 10/18/2012  . Smokeless tobacco: Not on file  .  Alcohol use Yes     Comment: 48 oz beer/sporadic     Allergies   Ceftin; Meloxicam; and Tramadol   Review of Systems Review of Systems  Constitutional: Negative for fever.  Respiratory: Positive for shortness of breath. Negative for cough.   Cardiovascular: Positive for leg swelling (resolved).  Musculoskeletal: Positive for back pain.  Neurological: Negative for weakness.  All other systems reviewed and are negative.    Physical Exam Updated Vital Signs BP (!) 187/128 (BP Location: Left Arm)   Pulse 94   Temp 98.8 F (37.1 C) (Oral)   Resp 20   Ht 5\' 6"  (1.676 m)   Wt (!) 400 lb (181.4 kg)   LMP 06/19/2014   SpO2 97%   BMI 64.56 kg/m   Physical Exam  Constitutional: She is oriented to person, place, and time. She appears well-developed and well-nourished. No distress.  Morbidly obese   HENT:  Head: Normocephalic and atraumatic.  Eyes: EOM are normal.  Neck: Normal range of motion.  Cardiovascular: Normal rate, regular rhythm and normal heart sounds.   Pulmonary/Chest: Effort normal and breath sounds normal.  Abdominal: Soft. She exhibits no distension. There is no tenderness.  Musculoskeletal: Normal range of motion.  Rubbery mass on left shoulder consistent with lipoma; nontender    Neurological: She is alert and oriented to person, place, and time.  Skin: Skin is warm and dry.  Psychiatric: She has a normal mood and affect. Judgment normal.  Nursing note and vitals reviewed.    ED Treatments / Results  DIAGNOSTIC STUDIES:  Oxygen Saturation is 97% on RA, normal by my interpretation.    COORDINATION OF CARE:  2:57 PM Discussed treatment plan with pt at bedside and pt agreed to plan.  Labs (all labs ordered are listed, but only abnormal results are displayed) Labs Reviewed  CBC WITH DIFFERENTIAL/PLATELET - Abnormal; Notable for the following:       Result Value   WBC 12.1 (*)    Hemoglobin 11.8 (*)    HCT 34.8 (*)    MCV 73.7 (*)    MCH 25.0  (*)    Neutro Abs 8.2 (*)    Monocytes Absolute 1.1 (*)    All other components within normal limits  BASIC METABOLIC PANEL  TROPONIN I    EKG  EKG Interpretation  Date/Time:  Monday April 13 2016 12:31:28 EST Ventricular Rate:  92 PR Interval:    QRS Duration: 98 QT Interval:  364 QTC Calculation: 451 R Axis:   78 Text Interpretation:  Sinus rhythm Anteroseptal infarct, old Confirmed by Wilson Singer  MD, Holland Kotter [phone]) on 04/13/2016 3:13:25 PM       Radiology Dg Chest 2 View  Result Date: 04/13/2016 CLINICAL DATA:  Shortness of  breath and low back pain since last night. EXAM: CHEST  2 VIEW COMPARISON:  PA and lateral chest and CT chest 08/15/2011. FINDINGS: The lungs are clear. Heart size is normal. There is no pneumothorax or pleural effusion. No acute bony abnormality. IMPRESSION: No acute disease. Electronically Signed   By: Inge Rise M.D.   On: 04/13/2016 13:12    Procedures Procedures (including critical care time)  Medications Ordered in ED Medications - No data to display   Initial Impression / Assessment and Plan / ED Course  I have reviewed the triage vital signs and the nursing notes.  Pertinent labs & imaging results that were available during my care of the patient were reviewed by me and considered in my medical decision making (see chart for details).     Final Clinical Impressions(s) / ED Diagnoses   Final diagnoses:  Dyspnea, unspecified type  Chronic back pain, unspecified back location, unspecified back pain laterality  Lipoma of left shoulder    New Prescriptions New Prescriptions   No medications on file    I personally preformed the services scribed in my presence. The recorded information has been reviewed is accurate. Virgel Manifold, MD.     Virgel Manifold, MD 04/19/16 [phone]

## 2016-06-02 ENCOUNTER — Other Ambulatory Visit: Payer: Self-pay | Admitting: Sports Medicine

## 2016-06-02 DIAGNOSIS — G8929 Other chronic pain: Secondary | ICD-10-CM

## 2016-06-02 DIAGNOSIS — M25561 Pain in right knee: Principal | ICD-10-CM

## 2016-06-11 ENCOUNTER — Other Ambulatory Visit: Payer: Self-pay | Admitting: Sports Medicine

## 2016-06-13 ENCOUNTER — Ambulatory Visit
Admission: RE | Admit: 2016-06-13 | Discharge: 2016-06-13 | Disposition: A | Discharge status: Home / Self Care | Payer: Medicaid Other | Source: Ambulatory Visit | Attending: Sports Medicine | Admitting: Sports Medicine

## 2016-06-13 DIAGNOSIS — M25561 Pain in right knee: Principal | ICD-10-CM

## 2016-06-13 DIAGNOSIS — G8929 Other chronic pain: Secondary | ICD-10-CM

## 2016-06-17 ENCOUNTER — Other Ambulatory Visit: Payer: Self-pay | Admitting: Sports Medicine

## 2016-06-18 ENCOUNTER — Other Ambulatory Visit: Payer: Self-pay | Admitting: Sports Medicine

## 2016-06-19 ENCOUNTER — Other Ambulatory Visit: Payer: Self-pay | Admitting: Sports Medicine

## 2016-06-19 DIAGNOSIS — G8929 Other chronic pain: Secondary | ICD-10-CM

## 2016-06-19 DIAGNOSIS — M545 Low back pain: Principal | ICD-10-CM

## 2016-06-24 ENCOUNTER — Ambulatory Visit
Admission: RE | Admit: 2016-06-24 | Discharge: 2016-06-24 | Disposition: A | Discharge status: Home / Self Care | Payer: Medicaid Other | Source: Ambulatory Visit | Attending: Sports Medicine | Admitting: Sports Medicine

## 2016-06-24 DIAGNOSIS — G8929 Other chronic pain: Secondary | ICD-10-CM

## 2016-06-24 DIAGNOSIS — M545 Low back pain: Principal | ICD-10-CM

## 2016-06-24 MED ORDER — IOPAMIDOL (ISOVUE-M 200) INJECTION 41%
1.0000 mL | Freq: Once | INTRAMUSCULAR | Status: AC
  Administered 2016-06-24: 1 mL via EPIDURAL

## 2016-06-24 MED ORDER — METHYLPREDNISOLONE ACETATE 40 MG/ML INJ SUSP (RADIOLOG
120.0000 mg | Freq: Once | INTRAMUSCULAR | Status: AC
  Administered 2016-06-24: 120 mg via EPIDURAL

## 2016-06-24 NOTE — Discharge Instructions (Signed)

## 2016-07-19 ENCOUNTER — Encounter (HOSPITAL_COMMUNITY): Payer: Self-pay

## 2016-07-19 ENCOUNTER — Emergency Department (HOSPITAL_COMMUNITY): Payer: Medicaid Other

## 2016-07-19 ENCOUNTER — Emergency Department (HOSPITAL_COMMUNITY)
Admission: EM | Admit: 2016-07-19 | Discharge: 2016-07-19 | Disposition: A | Discharge status: Home / Self Care | Payer: Medicaid Other | Attending: Emergency Medicine | Admitting: Emergency Medicine

## 2016-07-19 DIAGNOSIS — Z79899 Other long term (current) drug therapy: Secondary | ICD-10-CM | POA: Insufficient documentation

## 2016-07-19 DIAGNOSIS — F17210 Nicotine dependence, cigarettes, uncomplicated: Secondary | ICD-10-CM | POA: Insufficient documentation

## 2016-07-19 DIAGNOSIS — K2970 Gastritis, unspecified, without bleeding: Secondary | ICD-10-CM | POA: Diagnosis not present

## 2016-07-19 DIAGNOSIS — I10 Essential (primary) hypertension: Secondary | ICD-10-CM | POA: Insufficient documentation

## 2016-07-19 DIAGNOSIS — R1011 Right upper quadrant pain: Secondary | ICD-10-CM | POA: Diagnosis present

## 2016-07-19 LAB — CBC
HCT: 37.1 % (ref 36.0–46.0)
HEMOGLOBIN: 12.4 g/dL (ref 12.0–15.0)
MCH: 25.8 pg — AB (ref 26.0–34.0)
MCHC: 33.4 g/dL (ref 30.0–36.0)
MCV: 77.3 fL — AB (ref 78.0–100.0)
Platelets: 298 10*3/uL (ref 150–400)
RBC: 4.8 MIL/uL (ref 3.87–5.11)
RDW: 15.6 % — ABNORMAL HIGH (ref 11.5–15.5)
WBC: 10.1 10*3/uL (ref 4.0–10.5)

## 2016-07-19 LAB — URINALYSIS, ROUTINE W REFLEX MICROSCOPIC
Bilirubin Urine: NEGATIVE
GLUCOSE, UA: NEGATIVE mg/dL
HGB URINE DIPSTICK: NEGATIVE
Ketones, ur: NEGATIVE mg/dL
Leukocytes, UA: NEGATIVE
Nitrite: NEGATIVE
Protein, ur: NEGATIVE mg/dL
SPECIFIC GRAVITY, URINE: 1.016 (ref 1.005–1.030)
pH: 5 (ref 5.0–8.0)

## 2016-07-19 LAB — COMPREHENSIVE METABOLIC PANEL
ALBUMIN: 3.2 g/dL — AB (ref 3.5–5.0)
ALT: 20 U/L (ref 14–54)
ANION GAP: 8 (ref 5–15)
AST: 22 U/L (ref 15–41)
Alkaline Phosphatase: 91 U/L (ref 38–126)
BUN: 5 mg/dL — ABNORMAL LOW (ref 6–20)
CHLORIDE: 104 mmol/L (ref 101–111)
CO2: 26 mmol/L (ref 22–32)
CREATININE: 0.86 mg/dL (ref 0.44–1.00)
Calcium: 8.5 mg/dL — ABNORMAL LOW (ref 8.9–10.3)
GFR calc non Af Amer: >60 mL/min (ref >60)
GLUCOSE: 86 mg/dL (ref 65–99)
Potassium: 3.3 mmol/L — ABNORMAL LOW (ref 3.5–5.1)
SODIUM: 138 mmol/L (ref 135–145)
Total Bilirubin: 0.3 mg/dL (ref 0.3–1.2)
Total Protein: 7 g/dL (ref 6.5–8.1)

## 2016-07-19 LAB — LIPASE, BLOOD: LIPASE: 18 U/L (ref 11–51)

## 2016-07-19 LAB — I-STAT BETA HCG BLOOD, ED (MC, WL, AP ONLY): I-stat hCG, quantitative: <5 m[IU]/mL (ref <5)

## 2016-07-19 MED ORDER — GI COCKTAIL ~~LOC~~
30.0000 mL | Freq: Once | ORAL | Status: AC
  Administered 2016-07-19: 30 mL via ORAL
  Filled 2016-07-19: qty 30

## 2016-07-19 MED ORDER — OMEPRAZOLE 20 MG PO CPDR: 20.0000 mg | DELAYED_RELEASE_CAPSULE | Freq: Every day | ORAL | 0 refills | Status: DC

## 2016-07-19 MED ORDER — MORPHINE SULFATE (PF) 4 MG/ML IV SOLN
4.0000 mg | Freq: Once | INTRAVENOUS | Status: AC
  Administered 2016-07-19: 4 mg via INTRAVENOUS
  Filled 2016-07-19: qty 1

## 2016-07-19 MED ORDER — SUCRALFATE 1 G PO TABS: 1.0000 g | ORAL_TABLET | Freq: Three times a day (TID) | ORAL | 0 refills | Status: DC

## 2016-07-19 NOTE — ED Notes (Signed)
Patient is alert and orientedx4.  Patient was explained discharge instructions and they understood them with no questions.  The patient's  Son, Linda Sheppard is taking her home.

## 2016-07-19 NOTE — ED Notes (Signed)
Family at bedside. 

## 2016-07-19 NOTE — ED Provider Notes (Signed)
[facility] DEPT Provider Note   CSN: [id number] Arrival date & time: 07/19/16  9675     History   Chief Complaint Chief Complaint  Patient presents with  . Abdominal Pain  . Diarrhea    HPI Linda Sheppard is a 41 y.o. female.  HPI   41 year old female presents today with complaints of abdominal discomfort.  Patient notes that several months ago she developed right upper quadrant and epigastric abdominal discomfort that was nonsevere.  She reports this pain felt like "cramp".  She notes the symptoms are aggravated by eating.  She notes shortly after any p.o. intake she developed the pain.  She notes pain did not radiate, lasted for several minutes followed by diarrhea.  She denies any associated vomiting.  She reports that symptoms have been waxing and waning over the last several months, 2 weeks ago were very severe and went away.  She notes several days ago they again recurred with severe pain shortly after eating and associated nonbloody diarrhea.  She again denies any fever, vomiting, lower abdominal pain.  Patient notes that all food and drink with the exception of Pepsi cause her abdominal discomfort.  She denies any belching or burping, or any signs of indigestion.  Patient notes very little p.o. intake over the last day, noting she had a sausage yesterday that caused significant discomfort, and a small amount of liquid this morning.  Patient reports she has a history of partial hysterectomy, no other abdominal surgeries.  She denies any drug or alcohol use.    Past Medical History:  Diagnosis Date  . Anxiety   . Bipolar 1 disorder ([place])   . Depression   . Hypertension   . Obesity   . Polysubstance abuse   . Schizophrenia [facility])     Patient Active Problem List   Diagnosis Date Noted  . Wound infection after surgery 08/11/2014  . Post-operative state 07/31/2014  . Diverticulosis 07/02/2014  . Intramural leiomyoma of uterus 07/02/2014  . Pelvic pain in female  07/02/2014  . Morbid obesity ([place]) 07/02/2014  . Diverticulosis of large intestine without hemorrhage 07/02/2014  . Fibroid uterus, with degeneration 05/25/2014  . Alcohol abuse 10/05/2012  . Cocaine abuse 10/05/2012  . Chronic back pain greater than 3 months duration 07/16/2012  . Major depressive disorder, recurrent episode, moderate ([place]) 06/20/2012    Past Surgical History:  Procedure Laterality Date  . CESAREAN SECTION    . DENTAL SURGERY    . SUPRACERVICAL ABDOMINAL HYSTERECTOMY N/A 07/31/2014   Procedure: HYSTERECTOMY SUPRACERVICAL ABDOMINAL;  Surgeon: Emily Filbert, MD;  Location: [facility];  Service: Gynecology;  Laterality: N/A;  Requested 07/31/14 @ 10:00a with Nelwyn Salisbury first assist  . TUBAL LIGATION      OB History    Gravida Para Term Preterm AB Living   5 3 1 2 1 2    SAB TAB Ectopic Multiple Live Births           3      Obstetric Comments   1- SIDS 1- Stillbirth 2 living children       Home Medications    Prior to Admission medications   Medication Sig Start Date End Date Taking? Authorizing Provider  amLODipine (NORVASC) 10 MG tablet Take 1 tablet (10 mg total) by mouth daily. Patient taking differently: Take 10 mg by mouth daily after supper.  08/24/13  Yes Gregor Hams, MD  diclofenac (VOLTAREN) 75 MG EC tablet Take 75 mg by mouth 2 (two) times daily  as needed for mild pain.   Yes [provider]  DULoxetine (CYMBALTA) 30 MG capsule Take 30 mg by mouth daily.   Yes [provider]  lisinopril-hydrochlorothiazide (PRINZIDE,ZESTORETIC) 10-12.5 MG per tablet Take 1 tablet by mouth daily.   Yes [provider]  naproxen (NAPROSYN) 375 MG tablet Take 375 mg by mouth 2 (two) times daily as needed for mild pain.   Yes [provider]  oxyCODONE (ROXICODONE) 15 MG immediate release tablet Take 15 mg by mouth every 4 (four) hours.   Yes [provider]  omeprazole (PRILOSEC) 20 MG capsule Take 1 capsule (20 mg total) by mouth  daily. 07/19/16   Ndea Kilroy, Dellis Filbert, PA-C  sucralfate (CARAFATE) 1 g tablet Take 1 tablet (1 g total) by mouth 4 (four) times daily -  with meals and at bedtime. 07/19/16   Okey Regal, PA-C    Family History Family History  Problem Relation Age of Onset  . Hypertension Mother   . Osteoarthritis Mother   . Osteoarthritis Father     Social History Social History  Substance Use Topics  . Smoking status: Current Every Day Smoker    Packs/day: 0.20    Types: Cigarettes    Last attempt to quit: 10/18/2012  . Smokeless tobacco: Not on file  . Alcohol use Yes     Comment: 48 oz beer/sporadic     Allergies   Ceftin; Meloxicam; and Tramadol   Review of Systems Review of Systems  All other systems reviewed and are negative.  Physical Exam Updated Vital Signs BP (!) 153/122 (BP Location: Left Arm)   Pulse 82   Temp 97.9 F (36.6 C) (Oral)   Resp 16   LMP 06/19/2014   SpO2 100%   Physical Exam  Constitutional: She is oriented to person, place, and time. She appears well-developed and well-nourished.  HENT:  Head: Normocephalic and atraumatic.  Eyes: Conjunctivae are normal. Pupils are equal, round, and reactive to light. Right eye exhibits no discharge. Left eye exhibits no discharge. No scleral icterus.  Neck: Normal range of motion. No JVD present. No tracheal deviation present.  Pulmonary/Chest: Effort normal. No stridor.  Abdominal:  TTP of RUQ/Epigastric region  Neurological: She is alert and oriented to person, place, and time. Coordination normal.  Psychiatric: She has a normal mood and affect. Her behavior is normal. Judgment and thought content normal.  Nursing note and vitals reviewed.    ED Treatments / Results  Labs (all labs ordered are listed, but only abnormal results are displayed) Labs Reviewed  COMPREHENSIVE METABOLIC PANEL - Abnormal; Notable for the following:       Result Value   Potassium 3.3 (*)    BUN 5 (*)    Calcium 8.5 (*)    Albumin  3.2 (*)    All other components within normal limits  CBC - Abnormal; Notable for the following:    MCV 77.3 (*)    MCH 25.8 (*)    RDW 15.6 (*)    All other components within normal limits  URINALYSIS, ROUTINE W REFLEX MICROSCOPIC - Abnormal; Notable for the following:    APPearance HAZY (*)    All other components within normal limits  LIPASE, BLOOD  I-STAT BETA HCG BLOOD, ED ([facility], [facility], [facility] ONLY)    EKG  EKG Interpretation None       Radiology US Abdomen Limited Ruq  Result Date: 07/19/2016 CLINICAL DATA:  Right upper quadrant pain. EXAM: US ABDOMEN LIMITED - RIGHT UPPER  QUADRANT COMPARISON:  None. FINDINGS: Gallbladder: No gallstones or wall thickening visualized. No sonographic Murphy sign noted by sonographer. Common bile duct: Diameter: 4.6 mm Liver: No focal lesion identified. Within normal limits in parenchymal echogenicity. IMPRESSION: Normal study.  No cause for symptoms identified. Electronically Signed   By: Dorise Bullion III M.D   On: 07/19/2016 12:02    Procedures Procedures (including critical care time)  Medications Ordered in ED Medications  morphine 4 MG/ML injection 4 mg (4 mg Intravenous Given 07/19/16 1119)  gi cocktail (Maalox,Lidocaine,Donnatal) (30 mLs Oral Given 07/19/16 1232)     Initial Impression / Assessment and Plan / ED Course  I have reviewed the triage vital signs and the nursing notes.  Pertinent labs & imaging results that were available during my care of the patient were reviewed by me and considered in my medical decision making (see chart for details).     Final Clinical Impressions(s) / ED Diagnoses   Final diagnoses:  RUQ pain  Gastritis without bleeding, unspecified chronicity, unspecified gastritis type   Labs: Lipase, CMP, CBC, urinalysis  Imaging: Ultrasound abdomen limited right upper quadrant  Consults:  Therapeutics: Morphine, GI cocktail  Discharge Meds: Carafate, Prilosec  Assessment/Plan: 41 year old female  with likely gastritis.  Patient having right upper quadrant and epigastric discomfort.  This was improved with morphine, completely resolved with GI cocktail.  She has reassuring laboratory analysis, negative ultrasound of the gallbladder.  Patient will be started on Carafate, proton pump inhibitor, counseled on diet and exercise and food choices.  She will follow-up with both her primary care and gastroenterology for ongoing management.  She is given strict return precautions, she verbalized understanding and agreement to today's plan had no further questions or concerns.   New Prescriptions Discharge Medication List as of 07/19/2016 12:46 PM    START taking these medications   Details  omeprazole (PRILOSEC) 20 MG capsule Take 1 capsule (20 mg total) by mouth daily., Starting Sun 07/19/2016, Print    sucralfate (CARAFATE) 1 g tablet Take 1 tablet (1 g total) by mouth 4 (four) times daily -  with meals and at bedtime., Starting Sun 07/19/2016, Print         Maleeah Crossman, [place], PA-C 07/19/16 1252    Tavi Gaughran, Dellis Filbert, PA-C 07/19/16 1252    Dorie Rank, MD 07/20/16 [phone]

## 2016-07-19 NOTE — Discharge Instructions (Signed)
Please read attached information. If you experience any new or worsening signs or symptoms please return to the emergency room for evaluation. Please follow-up with your primary care provider or specialist as discussed. Please use medication prescribed only as directed and discontinue taking if you have any concerning signs or symptoms.   °

## 2016-07-19 NOTE — ED Triage Notes (Signed)
Patient complains of 2 days of abdominal cramping with diarrhea if she has any intake. No nausea, no vomiting, denies urinary symptoms. Patient alert and oriented, NAD

## 2016-07-19 NOTE — ED Notes (Signed)
Patient transported to Ultrasound 

## 2016-07-23 ENCOUNTER — Encounter (HOSPITAL_COMMUNITY): Payer: Self-pay | Admitting: *Deleted

## 2016-07-23 ENCOUNTER — Ambulatory Visit: Payer: Self-pay | Admitting: General Surgery

## 2016-07-23 MED ORDER — VANCOMYCIN HCL 10 G IV SOLR
1500.0000 mg | INTRAVENOUS | Status: AC
  Administered 2016-07-24: 1500 mg via INTRAVENOUS
  Filled 2016-07-23: qty 1500

## 2016-07-23 NOTE — H&P (Signed)
Linda Sheppard Location: [facility] Patient #: [id number] DOB: 1975/05/12 Single / Language: Cleophus Molt / Race: Black or African American Female   History of Present Illness  The patient is a 41 year old female who presents with a soft tissue mass. She is referred by Dr Alyson Ingles for evaluation of a left shoulder soft tissue mass. She states that it is been there for probably about 1 year. Initially it was small but has grown in size. Initially it was more tender when it was smaller but not as tender as it was. Her bra strap mainly aggravates it and causes discomfort. She denies any unplanned weight loss. She denies any night sweats or fever or chills. She denies any lymphadenopathy. She denies any other soft tissue masses. She denies any family history of soft tissue masses or cancers. She does smoke. A pack of cigarettes will last 2 days. She denies any alcohol use for the past 2 years. She denies any drug use. She denies any radiculopathy down her left arm.   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 Meloxicam *ANALGESICS - ANTI-INFLAMMATORY*  Anaphylaxis. TraMADol HCl *ANALGESICS - OPIOID*  Itching.  Medication History  AmLODIPine Besylate (10MG  Tablet, Oral) Active. Diclofenac Sodium (75MG  Tablet DR, Oral) Active. Lisinopril-Hydrochlorothiazide (20-25MG  Tablet, Oral) Active. Naproxen (375MG  Tablet, Oral) Active. OxyCODONE HCl (15MG  Tablet, Oral) Active. Medications Reconciled  Social History  Alcohol use  Occasional alcohol use. Caffeine use  Carbonated beverages, Coffee, Tea. No drug use   Family History  Arthritis  Father, Mother. Depression  Daughter. Diabetes Mellitus  Father. Hypertension  Brother, Daughter,  Mother. Migraine Headache  Daughter.  Pregnancy / Birth History Length (months) of breastfeeding  3-6 Para  2   Other Problems  Anxiety Disorder  Arthritis  Depression     Review of Systems  General Present- Fatigue. Not Present- Appetite Loss, Chills, Fever, Night Sweats, Weight Gain and Weight Loss. Respiratory Present- Chronic Cough. Not Present- Bloody sputum, Difficulty Breathing, Snoring and Wheezing. Breast Not Present- Breast Mass, Breast Pain, Nipple Discharge and Skin Changes. Gastrointestinal Not Present- Abdominal Pain, Bloating, Bloody Stool, Change in Bowel Habits, Chronic diarrhea, Constipation, Difficulty Swallowing, Excessive gas, Gets full quickly at meals, Hemorrhoids, Indigestion, Nausea, Rectal Pain and Vomiting. Musculoskeletal Present- Back Pain, Joint Pain, Joint Stiffness, Muscle Pain, Muscle Weakness and Swelling of Extremities. Neurological Present- Decreased Memory, Trouble walking and Weakness. Not Present- Fainting, Headaches, Numbness, Seizures, Tingling and Tremor. Psychiatric Present- Anxiety, Depression and Frequent crying. Not Present- Bipolar, Change in Sleep Pattern and Fearful.  Vitals  Weight: 388.4 lb Height: 66.5in Body Surface Area: 2.67 m Body Mass Index: 61.75 kg/m  Temp.: 99.13F  Pulse: 99 (Regular)  BP: 132/90 (Sitting, Left Arm, Standard)  Physical Exam  General Mental Status-Alert. General Appearance-Consistent with stated age. Hydration-Well hydrated. Voice-Normal. Note: severe obesity   Integumentary Note: top of L shoulder is well circumscribed soft tissue mass, rubbery, mobile, 10 x 13 cm. No overlying skin changes.   Head and Neck Head-normocephalic, atraumatic with no lesions or palpable masses. Trachea-midline. Thyroid Gland Characteristics - normal size and consistency.  Eye Eyeball - Bilateral-Extraocular movements intact. Sclera/Conjunctiva - Bilateral-No scleral  icterus.  ENMT Ears -Note: normal external ears hearing grossly normal.   Chest and Lung Exam Chest and lung exam reveals -quiet,  even and easy respiratory effort with no use of accessory muscles and on auscultation, normal breath sounds, no adventitious sounds and normal vocal resonance. Inspection Chest Wall - Normal. Back - normal.  Breast - Did not examine.  Cardiovascular Cardiovascular examination reveals -normal heart sounds, regular rate and rhythm with no murmurs and normal pedal pulses bilaterally.  Abdomen Inspection Inspection of the abdomen reveals - No Hernias. Skin - Scar - no surgical scars. Palpation/Percussion Palpation and Percussion of the abdomen reveal - Soft, Non Tender, No Rebound tenderness, No Rigidity (guarding) and No hepatosplenomegaly. Auscultation Auscultation of the abdomen reveals - Bowel sounds normal.  Peripheral Vascular Upper Extremity Palpation - Pulses bilaterally normal.  Neurologic Neurologic evaluation reveals -alert and oriented x 3 with no impairment of recent or remote memory. Mental Status-Normal.  Neuropsychiatric The patient's mood and affect are described as -normal. Judgment and Insight-insight is appropriate concerning matters relevant to self.  Musculoskeletal Normal Exam - Left-Upper Extremity Strength Normal and Lower Extremity Strength Normal. Normal Exam - Right-Upper Extremity Strength Normal and Lower Extremity Strength Normal.  Lymphatic Head & Neck  General Head & Neck Lymphatics: Bilateral - Description - Normal. Axillary -Note: no palpable LAD.  Femoral & Inguinal - Did not examine.    Assessment & Plan Randall Hiss M. 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 The patient was given educational material. We discussed that the majority of lipomas are benign although on a rare occasion it can be  malignant.  We discussed observation versus surgical excision. We discussed the risks and benefits of surgery including but not limited to bleeding, infection, injury to surrounding structures, scarring, cosmetic concerns, possible temporary drain placement, blood clot formation, anesthesia issues, possible recurrence, and the typical postoperative course.  The patient has elected to proceed with EXCISION OF LEFT SHOULDER LIPOMA.  We did discuss that she was slightly higher risk for breathing issues because of her morbid obesity as well as infection because of her tobacco use. We did discuss some strategies to work on her weight such as cutting back on sweet tea and sodas as well as cutting back on smoking Current Plans You are being scheduled for surgery- Our schedulers will call you.  You should hear from our office's scheduling department within 5 working days about the location, date, and time of surgery. We try to make accommodations for patient's preferences in scheduling surgery, but sometimes the OR schedule or the surgeon's schedule prevents [place] from making those accommodations.  If you have not heard from our office [phone]) in 5 working days, call the office and ask for your surgeon's nurse.  If you have other questions about your diagnosis, plan, or surgery, call the office and ask for your surgeon's nurse.  Pt Education - [facility] Education - Written Instructions given: discussed with patient and provided information. TOBACCO USE (Z72.0) Current Plans Pt Education - Smoking: Ways to Quit: tobacco  Leighton Ruff. Redmond Pulling, MD, FACS General, Bariatric, & Minimally Invasive Surgery [facility], [place]

## 2016-07-23 NOTE — Progress Notes (Signed)
Pt denies SOB, chest pain, and being under the care of a cardiologist. Pt denies having a stress test, echo and cardiac cath. Pt made aware to stop taking  Aspirin,vitamins, fish oil and herbal medications. Do not take any NSAIDs ie: Ibuprofen, Advil, Naproxen, BC and Goody Powder, Voltaren or any medication containing Aspirin. Pt verbalized understanding of all pre-op instructions.

## 2016-07-24 ENCOUNTER — Encounter (HOSPITAL_COMMUNITY): Payer: Self-pay | Admitting: Urology

## 2016-07-24 ENCOUNTER — Ambulatory Visit (HOSPITAL_COMMUNITY): Payer: Medicaid Other | Admitting: Certified Registered Nurse Anesthetist

## 2016-07-24 ENCOUNTER — Encounter (HOSPITAL_COMMUNITY)
Admission: RE | Disposition: A | Discharge status: Home / Self Care | Payer: Self-pay | Source: Ambulatory Visit | Attending: General Surgery

## 2016-07-24 ENCOUNTER — Observation Stay (HOSPITAL_COMMUNITY)
Admission: RE | Admit: 2016-07-24 | Discharge: 2016-07-25 | Disposition: A | Discharge status: Home / Self Care | Payer: Medicaid Other | Source: Ambulatory Visit | Attending: General Surgery | Admitting: General Surgery

## 2016-07-24 DIAGNOSIS — D1722 Benign lipomatous neoplasm of skin and subcutaneous tissue of left arm: Secondary | ICD-10-CM | POA: Diagnosis not present

## 2016-07-24 DIAGNOSIS — Z79891 Long term (current) use of opiate analgesic: Secondary | ICD-10-CM | POA: Diagnosis not present

## 2016-07-24 DIAGNOSIS — K219 Gastro-esophageal reflux disease without esophagitis: Secondary | ICD-10-CM | POA: Diagnosis not present

## 2016-07-24 DIAGNOSIS — M1990 Unspecified osteoarthritis, unspecified site: Secondary | ICD-10-CM | POA: Diagnosis not present

## 2016-07-24 DIAGNOSIS — I10 Essential (primary) hypertension: Secondary | ICD-10-CM | POA: Diagnosis not present

## 2016-07-24 DIAGNOSIS — Z6841 Body Mass Index (BMI) 40.0 and over, adult: Secondary | ICD-10-CM | POA: Insufficient documentation

## 2016-07-24 DIAGNOSIS — F419 Anxiety disorder, unspecified: Secondary | ICD-10-CM | POA: Diagnosis not present

## 2016-07-24 HISTORY — DX: Benign lipomatous neoplasm, unspecified: D17.9

## 2016-07-24 HISTORY — PX: LIPOMA EXCISION: SHX5283

## 2016-07-24 HISTORY — DX: Unspecified osteoarthritis, unspecified site: M19.90

## 2016-07-24 HISTORY — DX: Gastro-esophageal reflux disease without esophagitis: K21.9

## 2016-07-24 HISTORY — DX: Pneumonia, unspecified organism: J18.9

## 2016-07-24 HISTORY — DX: Anemia, unspecified: D64.9

## 2016-07-24 SURGERY — EXCISION LIPOMA
Anesthesia: General | Site: Shoulder | Laterality: Left

## 2016-07-24 MED ORDER — MORPHINE SULFATE (PF) 4 MG/ML IV SOLN
1.0000 mg | INTRAVENOUS | Status: DC | PRN
  Administered 2016-07-24 – 2016-07-25 (×2): 2 mg via INTRAVENOUS
  Filled 2016-07-24 (×2): qty 1

## 2016-07-24 MED ORDER — ONDANSETRON HCL 4 MG/2ML IJ SOLN
INTRAMUSCULAR | Status: DC | PRN
  Administered 2016-07-24: 4 mg via INTRAVENOUS

## 2016-07-24 MED ORDER — ROCURONIUM BROMIDE 100 MG/10ML IV SOLN
INTRAVENOUS | Status: DC | PRN
  Administered 2016-07-24: 50 mg via INTRAVENOUS

## 2016-07-24 MED ORDER — OXYCODONE HCL 5 MG PO TABS
5.0000 mg | ORAL_TABLET | ORAL | Status: DC | PRN
  Administered 2016-07-24 – 2016-07-25 (×5): 15 mg via ORAL
  Filled 2016-07-24: qty 3
  Filled 2016-07-24: qty 1
  Filled 2016-07-24 (×3): qty 3
  Filled 2016-07-24: qty 2

## 2016-07-24 MED ORDER — ONDANSETRON HCL 4 MG/2ML IJ SOLN
4.0000 mg | Freq: Four times a day (QID) | INTRAMUSCULAR | Status: DC | PRN
  Administered 2016-07-24: 4 mg via INTRAVENOUS
  Filled 2016-07-24: qty 2

## 2016-07-24 MED ORDER — DICLOFENAC SODIUM 75 MG PO TBEC
75.0000 mg | DELAYED_RELEASE_TABLET | Freq: Two times a day (BID) | ORAL | Status: DC | PRN
  Filled 2016-07-24: qty 1

## 2016-07-24 MED ORDER — LISINOPRIL 20 MG PO TABS
20.0000 mg | ORAL_TABLET | Freq: Every day | ORAL | Status: DC
  Administered 2016-07-24 – 2016-07-25 (×2): 20 mg via ORAL
  Filled 2016-07-24 (×2): qty 1

## 2016-07-24 MED ORDER — KCL IN DEXTROSE-NACL 20-5-0.45 MEQ/L-%-% IV SOLN
INTRAVENOUS | Status: DC
  Administered 2016-07-24: 12:00:00 via INTRAVENOUS
  Filled 2016-07-24: qty 1000

## 2016-07-24 MED ORDER — BUPIVACAINE HCL (PF) 0.25 % IJ SOLN
INTRAMUSCULAR | Status: AC
  Filled 2016-07-24: qty 30

## 2016-07-24 MED ORDER — SUCRALFATE 1 G PO TABS
1.0000 g | ORAL_TABLET | Freq: Three times a day (TID) | ORAL | Status: DC
  Administered 2016-07-24 – 2016-07-25 (×4): 1 g via ORAL
  Filled 2016-07-24 (×4): qty 1

## 2016-07-24 MED ORDER — SUGAMMADEX SODIUM 500 MG/5ML IV SOLN
INTRAVENOUS | Status: AC
Start: 2016-07-24 — End: 2016-07-24
  Filled 2016-07-24: qty 5

## 2016-07-24 MED ORDER — ACETAMINOPHEN 500 MG PO TABS
1000.0000 mg | ORAL_TABLET | ORAL | Status: AC
  Administered 2016-07-24: 1000 mg via ORAL
  Filled 2016-07-24: qty 2

## 2016-07-24 MED ORDER — FENTANYL CITRATE (PF) 250 MCG/5ML IJ SOLN
INTRAMUSCULAR | Status: AC
  Filled 2016-07-24: qty 5

## 2016-07-24 MED ORDER — BACITRACIN-NEOMYCIN-POLYMYXIN 400-5-5000 EX OINT
TOPICAL_OINTMENT | CUTANEOUS | Status: AC
  Filled 2016-07-24: qty 1

## 2016-07-24 MED ORDER — ONDANSETRON HCL 4 MG/2ML IJ SOLN
INTRAMUSCULAR | Status: AC
  Filled 2016-07-24: qty 2

## 2016-07-24 MED ORDER — CHLORHEXIDINE GLUCONATE 4 % EX LIQD: 60.0000 mL | Freq: Once | CUTANEOUS | Status: DC

## 2016-07-24 MED ORDER — ZOLPIDEM TARTRATE 5 MG PO TABS
5.0000 mg | ORAL_TABLET | Freq: Every evening | ORAL | Status: DC | PRN
  Administered 2016-07-24: 5 mg via ORAL
  Filled 2016-07-24: qty 1

## 2016-07-24 MED ORDER — LACTATED RINGERS IV SOLN
INTRAVENOUS | Status: DC | PRN
  Administered 2016-07-24 (×2): via INTRAVENOUS

## 2016-07-24 MED ORDER — BUPIVACAINE HCL (PF) 0.25 % IJ SOLN
INTRAMUSCULAR | Status: AC
Start: 2016-07-24 — End: 2016-07-24
  Filled 2016-07-24: qty 30

## 2016-07-24 MED ORDER — PANTOPRAZOLE SODIUM 40 MG PO TBEC
40.0000 mg | DELAYED_RELEASE_TABLET | Freq: Every day | ORAL | Status: DC
  Administered 2016-07-24 – 2016-07-25 (×2): 40 mg via ORAL
  Filled 2016-07-24 (×2): qty 1

## 2016-07-24 MED ORDER — LORAZEPAM 1 MG PO TABS
1.0000 mg | ORAL_TABLET | Freq: Three times a day (TID) | ORAL | Status: DC | PRN
  Administered 2016-07-24: 1 mg via ORAL
  Filled 2016-07-24: qty 1

## 2016-07-24 MED ORDER — DIPHENHYDRAMINE HCL 12.5 MG/5ML PO LIQD
12.5000 mg | Freq: Every day | ORAL | Status: DC | PRN
  Filled 2016-07-24: qty 5

## 2016-07-24 MED ORDER — ROCURONIUM BROMIDE 10 MG/ML (PF) SYRINGE
PREFILLED_SYRINGE | INTRAVENOUS | Status: AC
  Filled 2016-07-24: qty 5

## 2016-07-24 MED ORDER — ACETAMINOPHEN 650 MG RE SUPP
650.0000 mg | Freq: Four times a day (QID) | RECTAL | Status: DC | PRN
Start: 2016-07-24 — End: 2016-07-25

## 2016-07-24 MED ORDER — FENTANYL CITRATE (PF) 100 MCG/2ML IJ SOLN
INTRAMUSCULAR | Status: AC
  Filled 2016-07-24: qty 2

## 2016-07-24 MED ORDER — DOCUSATE SODIUM 100 MG PO CAPS
100.0000 mg | ORAL_CAPSULE | Freq: Two times a day (BID) | ORAL | Status: DC
  Administered 2016-07-24 – 2016-07-25 (×3): 100 mg via ORAL
  Filled 2016-07-24 (×3): qty 1

## 2016-07-24 MED ORDER — LIDOCAINE HCL (CARDIAC) 20 MG/ML IV SOLN
INTRAVENOUS | Status: DC | PRN
  Administered 2016-07-24: 100 mg via INTRAVENOUS

## 2016-07-24 MED ORDER — PROPOFOL 10 MG/ML IV BOLUS
INTRAVENOUS | Status: DC | PRN
  Administered 2016-07-24: 200 mg via INTRAVENOUS

## 2016-07-24 MED ORDER — KETOROLAC TROMETHAMINE 15 MG/ML IJ SOLN
15.0000 mg | Freq: Four times a day (QID) | INTRAMUSCULAR | Status: DC | PRN
  Administered 2016-07-24: 15 mg via INTRAVENOUS

## 2016-07-24 MED ORDER — 0.9 % SODIUM CHLORIDE (POUR BTL) OPTIME
TOPICAL | Status: DC | PRN
  Administered 2016-07-24: 1000 mL

## 2016-07-24 MED ORDER — ENOXAPARIN SODIUM 40 MG/0.4ML ~~LOC~~ SOLN
40.0000 mg | SUBCUTANEOUS | Status: DC
  Administered 2016-07-24: 40 mg via SUBCUTANEOUS
  Filled 2016-07-24: qty 0.4

## 2016-07-24 MED ORDER — SUCCINYLCHOLINE CHLORIDE 200 MG/10ML IV SOSY
PREFILLED_SYRINGE | INTRAVENOUS | Status: AC
Start: 2016-07-24 — End: 2016-07-24
  Filled 2016-07-24: qty 10

## 2016-07-24 MED ORDER — ACETAMINOPHEN 325 MG PO TABS
650.0000 mg | ORAL_TABLET | Freq: Four times a day (QID) | ORAL | Status: DC | PRN
Start: 2016-07-24 — End: 2016-07-25

## 2016-07-24 MED ORDER — NICOTINE 21 MG/24HR TD PT24
21.0000 mg | MEDICATED_PATCH | Freq: Every day | TRANSDERMAL | Status: DC
  Administered 2016-07-24: 21 mg via TRANSDERMAL
  Filled 2016-07-24: qty 1

## 2016-07-24 MED ORDER — FENTANYL CITRATE (PF) 100 MCG/2ML IJ SOLN
INTRAMUSCULAR | Status: DC | PRN
  Administered 2016-07-24: 50 ug via INTRAVENOUS
  Administered 2016-07-24: 100 ug via INTRAVENOUS

## 2016-07-24 MED ORDER — FENTANYL CITRATE (PF) 100 MCG/2ML IJ SOLN
25.0000 ug | INTRAMUSCULAR | Status: DC | PRN
  Administered 2016-07-24: 100 ug via INTRAVENOUS

## 2016-07-24 MED ORDER — PHENYLEPHRINE HCL 10 MG/ML IJ SOLN
INTRAVENOUS | Status: DC | PRN
  Administered 2016-07-24: 50 ug/min via INTRAVENOUS

## 2016-07-24 MED ORDER — LIDOCAINE 2% (20 MG/ML) 5 ML SYRINGE
INTRAMUSCULAR | Status: AC
  Filled 2016-07-24: qty 5

## 2016-07-24 MED ORDER — SUCCINYLCHOLINE CHLORIDE 20 MG/ML IJ SOLN
INTRAMUSCULAR | Status: DC | PRN
  Administered 2016-07-24: 200 mg via INTRAVENOUS

## 2016-07-24 MED ORDER — MORPHINE SULFATE (PF) 2 MG/ML IV SOLN: 1.0000 mg | INTRAVENOUS | Status: DC | PRN

## 2016-07-24 MED ORDER — KETOROLAC TROMETHAMINE 30 MG/ML IJ SOLN
INTRAMUSCULAR | Status: AC
  Filled 2016-07-24: qty 1

## 2016-07-24 MED ORDER — PHENYLEPHRINE 40 MCG/ML (10ML) SYRINGE FOR IV PUSH (FOR BLOOD PRESSURE SUPPORT)
PREFILLED_SYRINGE | INTRAVENOUS | Status: AC
  Filled 2016-07-24: qty 10

## 2016-07-24 MED ORDER — DULOXETINE HCL 30 MG PO CPEP
30.0000 mg | ORAL_CAPSULE | Freq: Every day | ORAL | Status: DC
  Administered 2016-07-24 – 2016-07-25 (×2): 30 mg via ORAL
  Filled 2016-07-24 (×2): qty 1

## 2016-07-24 MED ORDER — SUGAMMADEX SODIUM 500 MG/5ML IV SOLN
INTRAVENOUS | Status: DC | PRN
  Administered 2016-07-24: 500 mg via INTRAVENOUS

## 2016-07-24 MED ORDER — PROPOFOL 10 MG/ML IV BOLUS
INTRAVENOUS | Status: AC
  Filled 2016-07-24: qty 40

## 2016-07-24 MED ORDER — MIDAZOLAM HCL 2 MG/2ML IJ SOLN
INTRAMUSCULAR | Status: AC
  Filled 2016-07-24: qty 2

## 2016-07-24 MED ORDER — HYDROCHLOROTHIAZIDE 25 MG PO TABS
25.0000 mg | ORAL_TABLET | Freq: Every day | ORAL | Status: DC
  Administered 2016-07-24 – 2016-07-25 (×2): 25 mg via ORAL
  Filled 2016-07-24 (×2): qty 1

## 2016-07-24 MED ORDER — OXYCODONE HCL 5 MG PO TABS
ORAL_TABLET | ORAL | Status: AC
  Filled 2016-07-24: qty 1

## 2016-07-24 MED ORDER — BISMUTH SUBSALICYLATE 262 MG/15ML PO SUSP
30.0000 mL | Freq: Four times a day (QID) | ORAL | Status: DC | PRN
  Filled 2016-07-24: qty 118

## 2016-07-24 MED ORDER — BACITRACIN-NEOMYCIN-POLYMYXIN 400-5-5000 EX OINT
TOPICAL_OINTMENT | CUTANEOUS | Status: DC | PRN
  Administered 2016-07-24: 1 via TOPICAL

## 2016-07-24 MED ORDER — MIDAZOLAM HCL 5 MG/5ML IJ SOLN
INTRAMUSCULAR | Status: DC | PRN
  Administered 2016-07-24: 2 mg via INTRAVENOUS

## 2016-07-24 MED ORDER — BUPIVACAINE HCL (PF) 0.25 % IJ SOLN
INTRAMUSCULAR | Status: DC | PRN
  Administered 2016-07-24: 30 mL

## 2016-07-24 MED ORDER — ONDANSETRON 4 MG PO TBDP
4.0000 mg | ORAL_TABLET | Freq: Four times a day (QID) | ORAL | Status: DC | PRN
  Filled 2016-07-24: qty 1

## 2016-07-24 MED ORDER — AMLODIPINE BESYLATE 10 MG PO TABS
10.0000 mg | ORAL_TABLET | Freq: Every day | ORAL | Status: DC
  Administered 2016-07-24: 10 mg via ORAL
  Filled 2016-07-24: qty 1

## 2016-07-24 MED ORDER — OXYCODONE HCL 5 MG PO TABS
5.0000 mg | ORAL_TABLET | Freq: Once | ORAL | Status: AC | PRN
  Administered 2016-07-24: 5 mg via ORAL

## 2016-07-24 MED ORDER — OXYCODONE HCL 5 MG PO TABS: 5.0000 mg | ORAL_TABLET | ORAL | Status: DC | PRN

## 2016-07-24 MED ORDER — OXYCODONE HCL 5 MG/5ML PO SOLN: 5.0000 mg | Freq: Once | ORAL | Status: AC | PRN

## 2016-07-24 MED ORDER — LISINOPRIL-HYDROCHLOROTHIAZIDE 20-25 MG PO TABS: 1.0000 | ORAL_TABLET | Freq: Every day | ORAL | Status: DC

## 2016-07-24 MED ORDER — PHENYLEPHRINE HCL 10 MG/ML IJ SOLN
INTRAMUSCULAR | Status: DC | PRN
  Administered 2016-07-24 (×5): 80 ug via INTRAVENOUS

## 2016-07-24 MED ORDER — GABAPENTIN 300 MG PO CAPS
300.0000 mg | ORAL_CAPSULE | ORAL | Status: AC
  Administered 2016-07-24: 300 mg via ORAL
  Filled 2016-07-24: qty 1

## 2016-07-24 SURGICAL SUPPLY — 43 items
BLADE CLIPPER SURG (BLADE) IMPLANT
CANISTER SUCT 3000ML PPV (MISCELLANEOUS) IMPLANT
CHLORAPREP W/TINT 26ML (MISCELLANEOUS) ×3 IMPLANT
COVER SURGICAL LIGHT HANDLE (MISCELLANEOUS) ×3 IMPLANT
DECANTER SPIKE VIAL GLASS SM (MISCELLANEOUS) ×3 IMPLANT
DERMABOND ADVANCED (GAUZE/BANDAGES/DRESSINGS)
DERMABOND ADVANCED .7 DNX12 (GAUZE/BANDAGES/DRESSINGS) IMPLANT
DRAPE LAPAROTOMY T 102X78X121 (DRAPES) ×3 IMPLANT
DRAPE UTILITY XL STRL (DRAPES) ×6 IMPLANT
DRSG TEGADERM 4X4.75 (GAUZE/BANDAGES/DRESSINGS) ×6 IMPLANT
ELECT CAUTERY BLADE 6.4 (BLADE) ×3 IMPLANT
ELECT REM PT RETURN 9FT ADLT (ELECTROSURGICAL) ×3
ELECTRODE REM PT RTRN 9FT ADLT (ELECTROSURGICAL) ×1 IMPLANT
GAUZE SPONGE 4X4 12PLY STRL (GAUZE/BANDAGES/DRESSINGS) ×3 IMPLANT
GLOVE BIOGEL M STRL SZ7.5 (GLOVE) ×3 IMPLANT
GLOVE BIOGEL PI IND STRL 8 (GLOVE) ×3 IMPLANT
GLOVE BIOGEL PI INDICATOR 8 (GLOVE) ×6
GOWN STRL REUS W/ TWL LRG LVL3 (GOWN DISPOSABLE) ×1 IMPLANT
GOWN STRL REUS W/TWL 2XL LVL3 (GOWN DISPOSABLE) ×3 IMPLANT
GOWN STRL REUS W/TWL LRG LVL3 (GOWN DISPOSABLE) ×2
KIT BASIN OR (CUSTOM PROCEDURE TRAY) ×3 IMPLANT
KIT ROOM TURNOVER OR (KITS) ×3 IMPLANT
MARKER SKIN DUAL TIP RULER LAB (MISCELLANEOUS) ×3 IMPLANT
NEEDLE HYPO 25GX1X1/2 BEV (NEEDLE) ×3 IMPLANT
NS IRRIG 1000ML POUR BTL (IV SOLUTION) ×3 IMPLANT
PACK SURGICAL SETUP 50X90 (CUSTOM PROCEDURE TRAY) ×3 IMPLANT
PAD ARMBOARD 7.5X6 YLW CONV (MISCELLANEOUS) ×6 IMPLANT
PENCIL BUTTON HOLSTER BLD 10FT (ELECTRODE) ×3 IMPLANT
SPECIMEN JAR SMALL (MISCELLANEOUS) IMPLANT
SPONGE LAP 18X18 X RAY DECT (DISPOSABLE) ×3 IMPLANT
STAPLER VISISTAT 35W (STAPLE) IMPLANT
SUT ETHILON 3 0 PS 1 (SUTURE) ×9 IMPLANT
SUT MNCRL AB 4-0 PS2 18 (SUTURE) IMPLANT
SUT VIC AB 3-0 SH 18 (SUTURE) ×3 IMPLANT
SUT VIC AB 3-0 SH 27 (SUTURE) ×2
SUT VIC AB 3-0 SH 27X BRD (SUTURE) ×1 IMPLANT
SYR CONTROL 10ML LL (SYRINGE) ×3 IMPLANT
TOWEL OR 17X24 6PK STRL BLUE (TOWEL DISPOSABLE) ×3 IMPLANT
TOWEL OR 17X26 10 PK STRL BLUE (TOWEL DISPOSABLE) ×3 IMPLANT
TUBE CONNECTING 12'X1/4 (SUCTIONS)
TUBE CONNECTING 12X1/4 (SUCTIONS) IMPLANT
WATER STERILE IRR 1000ML POUR (IV SOLUTION) IMPLANT
YANKAUER SUCT BULB TIP NO VENT (SUCTIONS) IMPLANT

## 2016-07-24 NOTE — Op Note (Signed)
Linda Sheppard, Linda Sheppard             ACCOUNT NO.:  [id number]  MEDICAL RECORD NO.:  [id number]  LOCATION:                                 FACILITY:  PHYSICIAN:  Leighton Ruff. Redmond Pulling, MD, FACSDATE OF BIRTH:  1975/08/23  DATE OF PROCEDURE:  07/24/2016 DATE OF DISCHARGE:                              OPERATIVE REPORT   PREOPERATIVE DIAGNOSIS:  Left shoulder lipoma.  POSTOPERATIVE DIAGNOSIS:  Left shoulder lipoma (14 x 14 cm).  PROCEDURE:  Excision of left shoulder lipoma.  SURGEON:  Leighton Ruff. Redmond Pulling, MD, FACS.  ASSISTANT SURGEON:  None.  ANESTHESIA:  General.  ESTIMATED BLOOD LOSS:  Minimal.  SPECIMEN:  Left shoulder lipoma.  INDICATIONS FOR PROCEDURE:  The patient is a morbidly obese 41 year old [place] American female, who presented with symptomatic shoulder lipoma. It was sitting on top of her shoulder girdle and extending slightly posterior.  She desired surgical excision.  We discussed risks and benefits of surgery extensively.  Please see my H and P for further details.  DESCRIPTION OF PROCEDURE:  Preoperatively, the patient received medications consisting of oral Tylenol and gabapentin.  She was taken to the operating room and placed supine on the operating table.  General endotracheal anesthesia was established.  She was then rolled slightly to her right with left side up with a large roll placed behind her back. She had appropriate padding between her thighs and arms and was secured to the bed in multiple locations.  Sequential compression devices had been placed.  The top of her shoulder and upper back were prepped and draped in the usual standard surgical fashion with ChloraPrep.  I then drew an incision with a marking pen bivalving the lesion.  It sat on top of her left shoulder extending posteriorly.  A surgical time-out was performed.  Skin was incised with a #15 blade.  Deep dermis was divided with electrocautery.  There was normal subcutaneous fat and then I got into  membrane that I transected with the electrocautery, which revealed a deep lipoma that went all the way down to periosteum of her shoulder. The lipoma was removed in its entirety using blunt dissection along with electrocautery.  This freed the lipoma.  It measured about 14 x 14 cm. The wound was irrigated.  Local was infiltrated.  Deep dermis was reapproximated with interrupted inverted 3-0 Vicryl sutures and the skin was reapproximated with interrupted 3-0 nylon sutures followed by the application of Triple Antibiotic Ointment and sterile dressing.  Thirteen sutures were used on the skin.  The patient tolerated the procedure well.  She was extubated and taken to the recovery room in stable condition.  All needle and instrument counts were correct x2.  No immediate complications.     Leighton Ruff. Redmond Pulling, MD, FACS     EMW/MEDQ  D:  07/24/2016  T:  07/24/2016  Job:  [id number]

## 2016-07-24 NOTE — Interval H&P Note (Signed)
History and Physical Interval Note:  07/24/2016 7:24 AM  Linda Sheppard  has presented today for surgery, with the diagnosis of Left shoulder lipoma  The various methods of treatment have been discussed with the patient and family. After consideration of risks, benefits and other options for treatment, the patient has consented to  Procedure(s): EXCISION LIPOMA LEFT SHOULDER (Left) as a surgical intervention .  The patient's history has been reviewed, patient examined, no change in status, stable for surgery.  I have reviewed the patient's chart and labs.  Questions were answered to the patient's satisfaction.    Leighton Ruff. Redmond Pulling, MD, [place], Bariatric, & Minimally Invasive Surgery [facility], [place]  [facility]

## 2016-07-24 NOTE — Transfer of Care (Signed)
Immediate Anesthesia Transfer of Care Note  Patient: Linda Sheppard  Procedure(s) Performed: Procedure(s): EXCISION LIPOMA LEFT SHOULDER (Left)  Patient Location: PACU  Anesthesia Type:General  Level of Consciousness: awake, alert  and oriented  Airway & Oxygen Therapy: Patient Spontanous Breathing  Post-op Assessment: Report given to RN and Post -op Vital signs reviewed and stable  Post vital signs: Reviewed and stable  Last Vitals:  Vitals:   07/24/16 0608  BP: 135/85  Pulse: 82  Resp: 20  Temp: 36.8 C    Last Pain:  Vitals:   07/24/16 0608  TempSrc: Oral      Patients Stated Pain Goal: 3 (01/09/14 9458)  Complications: No apparent anesthesia complications

## 2016-07-24 NOTE — Anesthesia Procedure Notes (Signed)
Procedure Name: Intubation Date/Time: 07/24/2016 7:45 AM Performed by: Valda Favia Pre-anesthesia Checklist: Patient identified, Emergency Drugs available, Suction available, Patient being monitored and Timeout performed Patient Re-evaluated:Patient Re-evaluated prior to inductionOxygen Delivery Method: Circle system utilized Preoxygenation: Pre-oxygenation with 100% oxygen Intubation Type: IV induction and Rapid sequence Laryngoscope Size: Glidescope and 4 Grade View: Grade I Tube type: Oral Tube size: 7.0 mm Number of attempts: 1 Airway Equipment and Method: Video-laryngoscopy Placement Confirmation: ETT inserted through vocal cords under direct vision,  positive ETCO2 and breath sounds checked- equal and bilateral Secured at: 21 cm Tube secured with: Tape Dental Injury: Teeth and Oropharynx as per pre-operative assessment

## 2016-07-24 NOTE — Progress Notes (Signed)
Laughing/joking with staff and talking on cell phone with family.......has said aloud she feels better after fentanyl dose....moved to room 16

## 2016-07-24 NOTE — Brief Op Note (Signed)
07/24/2016  8:41 AM  PATIENT:  Linda Sheppard  41 y.o. female  PRE-OPERATIVE DIAGNOSIS:  Left shoulder lipoma  POST-OPERATIVE DIAGNOSIS:  Left shoulder lipoma 14 x 14 cm  PROCEDURE:  Procedure(s): EXCISION LIPOMA LEFT SHOULDER (Left)  SURGEON:  Surgeon(s) and Role:    Greer Pickerel, MD - Primary  PHYSICIAN ASSISTANT:   ASSISTANTS:    ANESTHESIA:   general  EBL:  No intake/output data recorded.  BLOOD ADMINISTERED:none  DRAINS: none   LOCAL MEDICATIONS USED:  MARCAINE     SPECIMEN:  Source of Specimen:  left shoulder lipoma  DISPOSITION OF SPECIMEN:  PATHOLOGY  COUNTS:  YES  TOURNIQUET:  * No tourniquets in log *  DICTATION: .Other Dictation: Dictation Number [id number]  PLAN OF CARE: Admit for overnight observation  PATIENT DISPOSITION:  PACU - hemodynamically stable.   Delay start of Pharmacological VTE agent (>24hrs) due to surgical blood loss or risk of bleeding: no  Leighton Ruff. Redmond Pulling, MD, FACS General, Bariatric, & Minimally Invasive Surgery [facility], [place]

## 2016-07-24 NOTE — Anesthesia Postprocedure Evaluation (Addendum)
Anesthesia Post Note  Patient: VAANI MORREN  Procedure(s) Performed: Procedure(s) (LRB): EXCISION LIPOMA LEFT SHOULDER (Left)  Patient location during evaluation: PACU Anesthesia Type: General Level of consciousness: awake and alert Pain management: pain level controlled Vital Signs Assessment: post-procedure vital signs reviewed and stable Respiratory status: spontaneous breathing, nonlabored ventilation, respiratory function stable and patient connected to nasal cannula oxygen Cardiovascular status: blood pressure returned to baseline and stable Postop Assessment: no signs of nausea or vomiting Anesthetic complications: no       Last Vitals:  Vitals:   07/24/16 0920 07/24/16 0930  BP:    Pulse: (!) 102 (!) 104  Resp: 16 19  Temp:      Last Pain:  Vitals:   07/24/16 0608  TempSrc: Oral                 Jericho Alcorn

## 2016-07-24 NOTE — Anesthesia Preprocedure Evaluation (Signed)
Anesthesia Evaluation  Patient identified by MRN, date of birth, ID band Patient awake    Reviewed: Allergy & Precautions, NPO status , Patient's Chart, lab work & pertinent test results, reviewed documented beta blocker date and time   History of Anesthesia Complications Negative for: history of anesthetic complications  Airway Mallampati: II  TM Distance: >3 FB Neck ROM: Full    Dental no notable dental hx. (+) Teeth Intact,    Pulmonary neg shortness of breath, neg sleep apnea, neg recent URI, Current Smoker,    Pulmonary exam normal breath sounds clear to auscultation       Cardiovascular hypertension, Pt. on medications Normal cardiovascular exam Rhythm:Regular Rate:Normal     Neuro/Psych PSYCHIATRIC DISORDERS Anxiety Depression Bipolar Disorder Schizophrenia negative neurological ROS     GI/Hepatic GERD  ,(+)     substance abuse  cocaine use,   Endo/Other  Morbid obesity  Renal/GU negative Renal ROS  negative genitourinary   Musculoskeletal negative musculoskeletal ROS (+)   Abdominal (+) + obese,   Peds  Hematology   Anesthesia Other Findings   Reproductive/Obstetrics Pelvic pain Degenerating uterine fibroid                             Anesthesia Physical Anesthesia Plan  ASA: III  Anesthesia Plan: General   Post-op Pain Management:    Induction: Intravenous  Airway Management Planned: Oral ETT  Additional Equipment: None  Intra-op Plan:   Post-operative Plan: Extubation in OR  Informed Consent: I have reviewed the patients History and Physical, chart, labs and discussed the procedure including the risks, benefits and alternatives for the proposed anesthesia with the patient or authorized representative who has indicated his/her understanding and acceptance.   Dental advisory given  Plan Discussed with: Surgeon and CRNA  Anesthesia Plan Comments:          Anesthesia Quick Evaluation

## 2016-07-25 ENCOUNTER — Encounter (HOSPITAL_COMMUNITY): Payer: Self-pay | Admitting: General Surgery

## 2016-07-25 DIAGNOSIS — D1722 Benign lipomatous neoplasm of skin and subcutaneous tissue of left arm: Secondary | ICD-10-CM | POA: Diagnosis not present

## 2016-07-25 MED ORDER — OXYCODONE HCL 5 MG PO TABS
5.0000 mg | ORAL_TABLET | ORAL | 0 refills | Status: DC | PRN
Start: 2016-07-25 — End: 2016-09-14

## 2016-07-25 NOTE — Discharge Summary (Signed)
[facility]/Trauma Discharge Summary   Patient ID: Linda Sheppard MRN: [id number] DOB/AGE: 1975-11-07 41 y.o.  Admit date: 07/24/2016 Discharge date: 07/25/2016  Admitting Diagnosis: Lipoma  Discharge Diagnosis Patient Active Problem List   Diagnosis Date Noted  . Lipoma of left shoulder 07/24/2016  . Wound infection after surgery 08/11/2014  . Post-operative state 07/31/2014  . Diverticulosis 07/02/2014  . Intramural leiomyoma of uterus 07/02/2014  . Pelvic pain in female 07/02/2014  . Morbid obesity ([place]) 07/02/2014  . Diverticulosis of large intestine without hemorrhage 07/02/2014  . Fibroid uterus, with degeneration 05/25/2014  . Alcohol abuse 10/05/2012  . Cocaine abuse 10/05/2012  . Chronic back pain greater than 3 months duration 07/16/2012  . Major depressive disorder, recurrent episode, moderate ([place]) 06/20/2012    Consultants None  Imaging: No results found.  Procedures Dr. Redmond Pulling (07/24/16) - Excision of left shoulder lipoma  Hospital Course:  Pt is a 41 year old obese female who presented for elective surgery.  Patient was admitted and underwent procedure listed above.  Tolerated procedure well and was transferred to the floor.  Diet was advanced as tolerated.  On POD#1, the patient was voiding well, tolerating diet, ambulating well, pain well controlled, vital signs stable, incisions c/d/i and felt stable for discharge home.  Patient will follow up with Dr. Redmond Pulling in 2 weeks and knows to call with questions or concerns.     Patient was discharged in good condition.  The [place] Substance controlled database was reviewed prior to prescribing narcotic pain medication to this patient.  Physical Exam: General:  Alert, NAD, pleasant, cooperative, well appearing Cardio: RRR, S1 & S2 normal, no murmur, rubs, gallops Resp: Effort normal, lungs CTA bilaterally, no wheezes, rales, rhonchi Skin: warm and dry, left upper posterior shoulder with  dressing in place over surgical incision  Allergies as of 07/25/2016      Reactions   Ceftin Anaphylaxis      Medication List    TAKE these medications   amLODipine 10 MG tablet Commonly known as:  NORVASC Take 1 tablet (10 mg total) by mouth daily. What changed:  when to take this   bismuth subsalicylate 462 VO/35KK suspension Commonly known as:  PEPTO BISMOL Take 30 mLs by mouth every 6 (six) hours as needed for indigestion or diarrhea or loose stools.   diclofenac 75 MG EC tablet Commonly known as:  VOLTAREN Take 75 mg by mouth 2 (two) times daily as needed for mild pain.   diphenhydrAMINE 12.5 MG/5ML liquid Commonly known as:  BENADRYL Take 12.5 mg by mouth daily as needed for itching or allergies.   DULoxetine 30 MG capsule Commonly known as:  CYMBALTA Take 30 mg by mouth daily.   lisinopril-hydrochlorothiazide 20-25 MG tablet Commonly known as:  PRINZIDE,ZESTORETIC Take 1 tablet by mouth daily.   omeprazole 20 MG capsule Commonly known as:  PRILOSEC Take 1 capsule (20 mg total) by mouth daily.   oxyCODONE 5 MG immediate release tablet Commonly known as:  Oxy IR/ROXICODONE Take 1 tablet (5 mg total) by mouth every 4 (four) hours as needed for severe pain. What changed:  medication strength  how much to take  when to take this  reasons to take this   sucralfate 1 g tablet Commonly known as:  CARAFATE Take 1 tablet (1 g total) by mouth 4 (four) times daily -  with meals and at bedtime.      Follow-up Information    Greer Pickerel, MD. Schedule an appointment  as soon as possible for a visit in 2 week(s).   Specialty:  General Surgery Why:  for follow up Contact information: [address] [place] [address] [phone]             Signed: [facility] 07/25/2016, 9:12 AM Pager: [phone] Consults: [phone] Mon-Fri 7:00 am-4:30 pm Sat-Sun 7:00 am-11:30 am

## 2016-07-25 NOTE — Discharge Instructions (Signed)
You may remove your dressing tomorrow and shower everyday. Replace with a clean sterile dressing after showering. Keep you wound clean and dry.    1. PAIN CONTROL:  1. Pain is best controlled by a usual combination of three different methods TOGETHER:  1. Ice/Heat 2. Over the counter pain medication 3. Prescription pain medication 2. Most patients will experience some swelling and bruising around wounds. Ice packs or heating pads (30-60 minutes up to 6 times a day) will help. Use ice for the first few days to help decrease swelling and bruising, then switch to heat to help relax tight/sore spots and speed recovery. Some people prefer to use ice alone, heat alone, alternating between ice & heat. Experiment to what works for you. Swelling and bruising can take several weeks to resolve.  3. It is helpful to take an over-the-counter pain medication regularly for the first few weeks. Avoid ibuprofen or NSAIDS if any acid reflux or history of stomach ulcers 1. Acetaminophen (Tylenol, etc) 500-650mg  four times a day (every meal & bedtime) 4. A prescription for pain medication (such as oxycodone, hydrocodone, etc) should be given to you upon discharge. Take your pain medication as prescribed.  1. If you are having problems/concerns with the prescription medicine (does not control pain, nausea, vomiting, rash, itching, etc), please call us [phone] to see if we need to switch you to a different pain medicine that will work better for you and/or control your side effect better. 2. You will not be issued a refill of pain medication 4. Avoid getting constipated. When taking pain medications, it is common to experience some constipation. Increasing fluid intake and taking a fiber supplement (such as Metamucil, Citrucel, FiberCon, MiraLax, etc) 1-2 times a day regularly will usually help prevent this problem from occurring. A mild laxative (prune juice, Milk of Magnesia, MiraLax, etc) should be taken according  to package directions if there are no bowel movements after 48 hours.  5. Watch out for diarrhea. If you have many loose bowel movements, simplify your diet to bland foods & liquids for a few days. Stop any stool softeners and decrease your fiber supplement. Switching to mild anti-diarrheal medications (Kayopectate, Pepto Bismol) can help. If this worsens or does not improve, please call us. 6. Wash / shower every day. You may shower daily and replace your bandges after showering. No bathing or submerging your wounds in water until they heal. 7. FOLLOW UP in our office  1. Please call [facility] at [phone] to set up an appointment for a follow-up appointment approximately 2 weeks after discharge for wound check  WHEN TO CALL us [phone]:  1. Poor pain control 2. Reactions / problems with new medications (rash/itching, nausea, etc)  3. Fever over 101.5 F (38.5 C) 4. Worsening swelling or bruising 5. Continued bleeding from wounds. 6. Increased pain, redness, or drainage from the wounds which could be signs of infection  The clinic staff is available to answer your questions during regular business hours (8:30am-5pm). Please dont hesitate to call and ask to speak to one of our nurses for clinical concerns.  If you have a medical emergency, go to the nearest emergency room or call 911.  A surgeon from [facility] is always on call at the [facility], [address], [place], [place], [place] [address] ?  MAIN: [phone] ? TOLL FREE: [phone] ?  FAX [phone]  [email or web address]

## 2016-07-25 NOTE — Care Management Note (Signed)
Case Management Note  Patient Details  Name: JUDIANN CELIA MRN: [id number] Date of Birth: [birth year]  Subjective/Objective: 41 y.o. F s/p Excision Shoulder Lipoma to be discharged with no needs                    Action/Plan:CM will sign off for now but will be available should additional discharge needs arise or disposition change.    Expected Discharge Date:  07/25/16               Expected Discharge Plan:  Home/Self Care  In-House Referral:     Discharge planning Services  CM Consult  Post Acute Care Choice:    Choice offered to:     DME Arranged:    DME Agency:     HH Arranged:    HH Agency:     Status of Service:  Completed, signed off  If discussed at [employer] of Stay Meetings, dates discussed:    Additional Comments:  Delrae Sawyers, RN 07/25/2016, 9:29 AM

## 2016-08-04 ENCOUNTER — Encounter (HOSPITAL_COMMUNITY): Payer: Self-pay | Admitting: Family Medicine

## 2016-08-04 ENCOUNTER — Ambulatory Visit (HOSPITAL_COMMUNITY)
Admission: EM | Admit: 2016-08-04 | Discharge: 2016-08-04 | Disposition: A | Discharge status: Home / Self Care | Payer: Medicaid Other | Attending: Family Medicine | Admitting: Family Medicine

## 2016-08-04 DIAGNOSIS — K219 Gastro-esophageal reflux disease without esophagitis: Secondary | ICD-10-CM | POA: Diagnosis not present

## 2016-08-04 DIAGNOSIS — Z79899 Other long term (current) drug therapy: Secondary | ICD-10-CM | POA: Insufficient documentation

## 2016-08-04 DIAGNOSIS — I10 Essential (primary) hypertension: Secondary | ICD-10-CM | POA: Diagnosis not present

## 2016-08-04 DIAGNOSIS — F319 Bipolar disorder, unspecified: Secondary | ICD-10-CM | POA: Insufficient documentation

## 2016-08-04 DIAGNOSIS — F419 Anxiety disorder, unspecified: Secondary | ICD-10-CM | POA: Insufficient documentation

## 2016-08-04 DIAGNOSIS — F17210 Nicotine dependence, cigarettes, uncomplicated: Secondary | ICD-10-CM | POA: Diagnosis not present

## 2016-08-04 DIAGNOSIS — F209 Schizophrenia, unspecified: Secondary | ICD-10-CM | POA: Insufficient documentation

## 2016-08-04 DIAGNOSIS — J029 Acute pharyngitis, unspecified: Secondary | ICD-10-CM | POA: Diagnosis not present

## 2016-08-04 LAB — POCT RAPID STREP A: Streptococcus, Group A Screen (Direct): NEGATIVE

## 2016-08-04 MED ORDER — CHLORHEXIDINE GLUCONATE 0.12 % MT SOLN: 15.0000 mL | Freq: Two times a day (BID) | OROMUCOSAL | 0 refills | Status: DC

## 2016-08-04 NOTE — ED Triage Notes (Signed)
sorethroat         Pain   When   She   Swallows         Took   Some  Lidocaine  With  Partial  releif       Pt is   Verbally  reposonsive   Speaking  In  Complete   sentances          Symptoms  Today

## 2016-08-04 NOTE — ED Provider Notes (Signed)
[facility]    CSN: [id number] Arrival date & time: 08/04/16  7062     History   Chief Complaint Chief Complaint  Patient presents with  . Sore Throat    HPI Linda Sheppard is a 41 y.o. female.   This is a 40 year old woman, recently seen for evaluation of abdominal discomfort in the emergency department, who today presents with a sore throat.  Her symptoms began this morning. Her throat is not hurting quite as bad after her morning coffee.  Patient works as a Geophysicist/field seismologist for [employer].      Past Medical History:  Diagnosis Date  . Anemia   . Anxiety   . Arthritis   . Bipolar 1 disorder ([place])   . Depression   . GERD (gastroesophageal reflux disease)   . Hypertension   . Lipoma    left shoulder  . Obesity   . Pneumonia   . Polysubstance abuse   . Schizophrenia [facility])     Patient Active Problem List   Diagnosis Date Noted  . Lipoma of left shoulder 07/24/2016  . Wound infection after surgery 08/11/2014  . Post-operative state 07/31/2014  . Diverticulosis 07/02/2014  . Intramural leiomyoma of uterus 07/02/2014  . Pelvic pain in female 07/02/2014  . Morbid obesity ([place]) 07/02/2014  . Diverticulosis of large intestine without hemorrhage 07/02/2014  . Fibroid uterus, with degeneration 05/25/2014  . Alcohol abuse 10/05/2012  . Cocaine abuse 10/05/2012  . Chronic back pain greater than 3 months duration 07/16/2012  . Major depressive disorder, recurrent episode, moderate ([place]) 06/20/2012    Past Surgical History:  Procedure Laterality Date  . CESAREAN SECTION    . DENTAL SURGERY    . LIPOMA EXCISION Left 07/24/2016   Procedure: EXCISION LIPOMA LEFT SHOULDER;  Surgeon: Greer Pickerel, MD;  Location: [place];  Service: General;  Laterality: Left;  . SUPRACERVICAL ABDOMINAL HYSTERECTOMY N/A 07/31/2014   Procedure: HYSTERECTOMY SUPRACERVICAL ABDOMINAL;  Surgeon: Emily Filbert, MD;  Location: [facility];  Service: Gynecology;  Laterality: N/A;  Requested 07/31/14 @  10:00a with Nelwyn Salisbury first assist  . TUBAL LIGATION      OB History    Gravida Para Term Preterm AB Living   5 3 1 2 1 2    SAB TAB Ectopic Multiple Live Births           3      Obstetric Comments   1- SIDS 1- Stillbirth 2 living children       Home Medications    Prior to Admission medications   Medication Sig Start Date End Date Taking? Authorizing Provider  amLODipine (NORVASC) 10 MG tablet Take 1 tablet (10 mg total) by mouth daily. Patient taking differently: Take 10 mg by mouth daily after supper.  08/24/13   Gregor Hams, MD  bismuth subsalicylate (PEPTO BISMOL) 262 MG/15ML suspension Take 30 mLs by mouth every 6 (six) hours as needed for indigestion or diarrhea or loose stools.    [provider]  chlorhexidine (PERIDEX) 0.12 % solution Use as directed 15 mLs in the mouth or throat 2 (two) times daily. 08/04/16   Robyn Haber, MD  diclofenac (VOLTAREN) 75 MG EC tablet Take 75 mg by mouth 2 (two) times daily as needed for mild pain.    [provider]  diphenhydrAMINE (BENADRYL) 12.5 MG/5ML liquid Take 12.5 mg by mouth daily as needed for itching or allergies.    [provider]  DULoxetine (CYMBALTA) 30 MG capsule Take 30 mg  by mouth daily.    [provider]  lisinopril-hydrochlorothiazide (PRINZIDE,ZESTORETIC) 20-25 MG tablet Take 1 tablet by mouth daily.    [provider]  omeprazole (PRILOSEC) 20 MG capsule Take 1 capsule (20 mg total) by mouth daily. 07/19/16   Hedges, Dellis Filbert, PA-C  oxyCODONE (OXY IR/ROXICODONE) 5 MG immediate release tablet Take 1 tablet (5 mg total) by mouth every 4 (four) hours as needed for severe pain. 07/25/16   Focht, Fraser Din, PA  sucralfate (CARAFATE) 1 g tablet Take 1 tablet (1 g total) by mouth 4 (four) times daily -  with meals and at bedtime. 07/19/16   Okey Regal, PA-C    Family History Family History  Problem Relation Age of Onset  . Hypertension Mother   . Osteoarthritis Mother   .  Osteoarthritis Father     Social History Social History  Substance Use Topics  . Smoking status: Current Every Day Smoker    Packs/day: 0.50    Types: Cigarettes  . Smokeless tobacco: Never Used  . Alcohol use Yes     Comment: occasional     Allergies   Ceftin   Review of Systems Review of Systems  Constitutional: Negative.   HENT: Positive for sore throat.   Respiratory: Negative.   Cardiovascular: Negative.   Gastrointestinal: Negative.   All other systems reviewed and are negative.    Physical Exam Triage Vital Signs ED Triage Vitals  Enc Vitals Group     BP      Pulse      Resp      Temp      Temp src      SpO2      Weight      Height      Head Circumference      Peak Flow      Pain Score      Pain Loc      Pain Edu?      Excl. in [place]?    No data found.   Updated Vital Signs BP 125/83 (BP Location: Right Arm)   Pulse 78   Temp 99.7 F (37.6 C) (Oral)   Resp 18   LMP 06/19/2014    Physical Exam  Constitutional: She is oriented to person, place, and time. She appears well-developed and well-nourished.  HENT:  Head: Normocephalic.  Right Ear: External ear normal.  Left Ear: External ear normal.  Mildly erythematous and slightly swollen tonsils and posterior pharynx  Eyes: Conjunctivae and EOM are normal. Pupils are equal, round, and reactive to light.  Neck: Normal range of motion. Neck supple.  Pulmonary/Chest: Effort normal.  Musculoskeletal: Normal range of motion.  Neurological: She is alert and oriented to person, place, and time.  Skin: Skin is warm and dry.  Nursing note and vitals reviewed.    UC Treatments / Results  Labs (all labs ordered are listed, but only abnormal results are displayed) Labs Reviewed  POCT RAPID STREP A    EKG  EKG Interpretation None       Radiology No results found.  Procedures Procedures (including critical care time)  Medications Ordered in UC Medications - No data to  display   Initial Impression / Assessment and Plan / UC Course  I have reviewed the triage vital signs and the nursing notes.  Pertinent labs & imaging results that were available during my care of the patient were reviewed by me and considered in my medical decision making (see chart for details).  Final Clinical Impressions(s) / UC Diagnoses   Final diagnoses:  Sore throat    New Prescriptions New Prescriptions   CHLORHEXIDINE (PERIDEX) 0.12 % SOLUTION    Use as directed 15 mLs in the mouth or throat 2 (two) times daily.     Robyn Haber, MD 08/04/16 1036

## 2016-08-07 LAB — CULTURE, GROUP A STREP (THRC)

## 2016-08-10 NOTE — Addendum Note (Signed)
Addendum  created 08/10/16 1533 by Oleta Mouse, MD   Sign clinical note

## 2016-09-14 ENCOUNTER — Encounter (HOSPITAL_COMMUNITY): Payer: Self-pay | Admitting: Emergency Medicine

## 2016-09-14 ENCOUNTER — Emergency Department (HOSPITAL_COMMUNITY): Payer: Medicaid Other

## 2016-09-14 ENCOUNTER — Emergency Department (HOSPITAL_COMMUNITY)
Admission: EM | Admit: 2016-09-14 | Discharge: 2016-09-14 | Disposition: A | Discharge status: Home / Self Care | Payer: Medicaid Other | Attending: Emergency Medicine | Admitting: Emergency Medicine

## 2016-09-14 DIAGNOSIS — F17210 Nicotine dependence, cigarettes, uncomplicated: Secondary | ICD-10-CM | POA: Insufficient documentation

## 2016-09-14 DIAGNOSIS — R1013 Epigastric pain: Secondary | ICD-10-CM | POA: Insufficient documentation

## 2016-09-14 DIAGNOSIS — I10 Essential (primary) hypertension: Secondary | ICD-10-CM | POA: Insufficient documentation

## 2016-09-14 DIAGNOSIS — Z79899 Other long term (current) drug therapy: Secondary | ICD-10-CM | POA: Diagnosis not present

## 2016-09-14 DIAGNOSIS — R1010 Upper abdominal pain, unspecified: Secondary | ICD-10-CM | POA: Diagnosis not present

## 2016-09-14 LAB — CBC
HEMATOCRIT: 37.8 % (ref 36.0–46.0)
Hemoglobin: 13 g/dL (ref 12.0–15.0)
MCH: 26.3 pg (ref 26.0–34.0)
MCHC: 34.4 g/dL (ref 30.0–36.0)
MCV: 76.4 fL — ABNORMAL LOW (ref 78.0–100.0)
PLATELETS: 290 10*3/uL (ref 150–400)
RBC: 4.95 MIL/uL (ref 3.87–5.11)
RDW: 14.7 % (ref 11.5–15.5)
WBC: 14.9 10*3/uL — ABNORMAL HIGH (ref 4.0–10.5)

## 2016-09-14 LAB — COMPREHENSIVE METABOLIC PANEL
ALBUMIN: 3.7 g/dL (ref 3.5–5.0)
ALK PHOS: 81 U/L (ref 38–126)
ALT: 16 U/L (ref 14–54)
AST: 20 U/L (ref 15–41)
Anion gap: 11 (ref 5–15)
BILIRUBIN TOTAL: 0.4 mg/dL (ref 0.3–1.2)
BUN: 7 mg/dL (ref 6–20)
CALCIUM: 9.1 mg/dL (ref 8.9–10.3)
CO2: 26 mmol/L (ref 22–32)
CREATININE: 0.92 mg/dL (ref 0.44–1.00)
Chloride: 103 mmol/L (ref 101–111)
GFR calc Af Amer: >60 mL/min (ref >60)
GFR calc non Af Amer: >60 mL/min (ref >60)
GLUCOSE: 103 mg/dL — AB (ref 65–99)
Potassium: 3.6 mmol/L (ref 3.5–5.1)
Sodium: 140 mmol/L (ref 135–145)
TOTAL PROTEIN: 7.8 g/dL (ref 6.5–8.1)

## 2016-09-14 LAB — LIPASE, BLOOD: Lipase: 13 U/L (ref 11–51)

## 2016-09-14 MED ORDER — DIPHENHYDRAMINE HCL 50 MG/ML IJ SOLN
12.5000 mg | Freq: Once | INTRAMUSCULAR | Status: AC
  Administered 2016-09-14: 12.5 mg via INTRAVENOUS
  Filled 2016-09-14: qty 1

## 2016-09-14 MED ORDER — DICYCLOMINE HCL 20 MG PO TABS: 20.0000 mg | ORAL_TABLET | Freq: Two times a day (BID) | ORAL | 0 refills | Status: DC

## 2016-09-14 MED ORDER — SODIUM CHLORIDE 0.9 % IV SOLN: INTRAVENOUS | Status: DC

## 2016-09-14 MED ORDER — FAMOTIDINE 20 MG PO TABS
40.0000 mg | ORAL_TABLET | Freq: Once | ORAL | Status: AC
  Administered 2016-09-14: 40 mg via ORAL
  Filled 2016-09-14: qty 2

## 2016-09-14 MED ORDER — SUCRALFATE 1 G PO TABS: 1.0000 g | ORAL_TABLET | Freq: Once | ORAL | Status: DC

## 2016-09-14 MED ORDER — SODIUM CHLORIDE 0.9 % IV BOLUS (SEPSIS): 1000.0000 mL | Freq: Once | INTRAVENOUS | Status: DC

## 2016-09-14 MED ORDER — FAMOTIDINE 20 MG PO TABS: 20.0000 mg | ORAL_TABLET | Freq: Two times a day (BID) | ORAL | 0 refills | Status: DC

## 2016-09-14 MED ORDER — HYDROMORPHONE HCL 1 MG/ML IJ SOLN
1.0000 mg | Freq: Once | INTRAMUSCULAR | Status: AC
  Administered 2016-09-14: 1 mg via INTRAVENOUS
  Filled 2016-09-14: qty 1

## 2016-09-14 MED ORDER — SUCRALFATE 1 G PO TABS
1.0000 g | ORAL_TABLET | Freq: Four times a day (QID) | ORAL | 0 refills | Status: DC
Start: 2016-09-14 — End: 2017-05-13

## 2016-09-14 MED ORDER — METOCLOPRAMIDE HCL 5 MG/ML IJ SOLN
5.0000 mg | Freq: Once | INTRAMUSCULAR | Status: AC
  Administered 2016-09-14: 5 mg via INTRAVENOUS
  Filled 2016-09-14: qty 2

## 2016-09-14 MED ORDER — ONDANSETRON 4 MG PO TBDP
4.0000 mg | ORAL_TABLET | Freq: Once | ORAL | Status: AC | PRN
Start: 2016-09-14 — End: 2016-09-14
  Administered 2016-09-14: 4 mg via ORAL
  Filled 2016-09-14: qty 1

## 2016-09-14 NOTE — ED Notes (Signed)
Pt requesting food at this time, NT took pt food

## 2016-09-14 NOTE — ED Notes (Signed)
Patient offered Zofran but refused at this time. Patient requesting pain medications.

## 2016-09-14 NOTE — ED Notes (Signed)
Patient is alert and oriented x3.  She was given DC instructions and follow up visit instructions.  Patient gave verbal understanding. She was DC ambulatory under her own power to home.  V/S stable.  He was not showing any signs of distress on DC 

## 2016-09-14 NOTE — ED Triage Notes (Signed)
Patient c/o upper abd pain that started today with n/v. patient reports that this is recurring pain over the past couple months.

## 2016-09-14 NOTE — ED Provider Notes (Signed)
[facility] DEPT Provider Note   CSN: [id number] Arrival date & time: 09/14/16  8115     History   Chief Complaint Chief Complaint  Patient presents with  . Abdominal Pain    HPI Linda Sheppard is a 41 y.o. female.  41 year old morbidly obese female presents with recurrent midepigastric abdominal discomfort radiates to her right upper quadrant. Patient has been diagnosed with gastritis in the past from overuse of NSAIDs. Denies any current use of that medication at this time. Pain is somewhat worse after she eats certain foods. Pain characterizes burning and sharp and today was associated with nonbilious/non-bloody emesis. Denies any urinary symptoms. No vaginal bleeding or discharge. Denies any dyspnea or anginal symptoms. No prior diagnosis of cholelithiasis. Symptoms persistent and no treatment use prior to arrival.      Past Medical History:  Diagnosis Date  . Anemia   . Anxiety   . Arthritis   . Bipolar 1 disorder ([place])   . Depression   . GERD (gastroesophageal reflux disease)   . Hypertension   . Lipoma    left shoulder  . Obesity   . Pneumonia   . Polysubstance abuse   . Schizophrenia [facility])     Patient Active Problem List   Diagnosis Date Noted  . Lipoma of left shoulder 07/24/2016  . Wound infection after surgery 08/11/2014  . Post-operative state 07/31/2014  . Diverticulosis 07/02/2014  . Intramural leiomyoma of uterus 07/02/2014  . Pelvic pain in female 07/02/2014  . Morbid obesity ([place]) 07/02/2014  . Diverticulosis of large intestine without hemorrhage 07/02/2014  . Fibroid uterus, with degeneration 05/25/2014  . Alcohol abuse 10/05/2012  . Cocaine abuse 10/05/2012  . Chronic back pain greater than 3 months duration 07/16/2012  . Major depressive disorder, recurrent episode, moderate ([place]) 06/20/2012    Past Surgical History:  Procedure Laterality Date  . CESAREAN SECTION    . DENTAL SURGERY    . LIPOMA EXCISION Left 07/24/2016   Procedure: EXCISION LIPOMA LEFT SHOULDER;  Surgeon: Greer Pickerel, MD;  Location: [place];  Service: General;  Laterality: Left;  . SUPRACERVICAL ABDOMINAL HYSTERECTOMY N/A 07/31/2014   Procedure: HYSTERECTOMY SUPRACERVICAL ABDOMINAL;  Surgeon: Emily Filbert, MD;  Location: [facility];  Service: Gynecology;  Laterality: N/A;  Requested 07/31/14 @ 10:00a with Nelwyn Salisbury first assist  . TUBAL LIGATION      OB History    Gravida Para Term Preterm AB Living   5 3 1 2 1 2    SAB TAB Ectopic Multiple Live Births           3      Obstetric Comments   1- SIDS 1- Stillbirth 2 living children       Home Medications    Prior to Admission medications   Medication Sig Start Date End Date Taking? Authorizing Provider  bismuth subsalicylate (PEPTO BISMOL) 262 MG/15ML suspension Take 30 mLs by mouth every 6 (six) hours as needed for indigestion or diarrhea or loose stools.   Yes [provider]  clonazePAM (KLONOPIN) 1 MG tablet Take 1 mg by mouth 2 (two) times daily.   Yes [provider]  diclofenac (VOLTAREN) 75 MG EC tablet Take 75 mg by mouth 2 (two) times daily as needed for mild pain.   Yes [provider]  DULoxetine (CYMBALTA) 30 MG capsule Take 30 mg by mouth daily.   Yes [provider]  gabapentin (NEURONTIN) 300 MG capsule Take 300 mg by mouth 3 (three) times daily.  Yes [provider]  lisinopril-hydrochlorothiazide (PRINZIDE,ZESTORETIC) 20-25 MG tablet Take 1 tablet by mouth daily.   Yes [provider]  oxyCODONE (ROXICODONE) 15 MG immediate release tablet Take 15 mg by mouth 4 (four) times daily. 07/27/16  Yes [provider]  amLODipine (NORVASC) 10 MG tablet Take 1 tablet (10 mg total) by mouth daily. Patient taking differently: Take 10 mg by mouth daily after supper.  08/24/13   Gregor Hams, MD  chlorhexidine (PERIDEX) 0.12 % solution Use as directed 15 mLs in the mouth or throat 2 (two) times daily. Patient not taking: Reported on  09/14/2016 08/04/16   Robyn Haber, MD  omeprazole (PRILOSEC) 20 MG capsule Take 1 capsule (20 mg total) by mouth daily. Patient not taking: Reported on 09/14/2016 07/19/16   Hedges, Dellis Filbert, PA-C  sucralfate (CARAFATE) 1 g tablet Take 1 tablet (1 g total) by mouth 4 (four) times daily -  with meals and at bedtime. Patient not taking: Reported on 09/14/2016 07/19/16   Okey Regal, PA-C    Family History Family History  Problem Relation Age of Onset  . Hypertension Mother   . Osteoarthritis Mother   . Osteoarthritis Father     Social History Social History  Substance Use Topics  . Smoking status: Current Every Day Smoker    Packs/day: 0.50    Types: Cigarettes  . Smokeless tobacco: Never Used  . Alcohol use Yes     Comment: occasional     Allergies   Ceftin   Review of Systems Review of Systems  All other systems reviewed and are negative.    Physical Exam Updated Vital Signs BP (!) 145/126 (BP Location: Right Arm)   Pulse 88   Temp 98.9 F (37.2 C) (Oral)   Resp 19   LMP 06/19/2014   SpO2 99%   Physical Exam  Constitutional: She is oriented to person, place, and time. She appears well-developed and well-nourished.  Non-toxic appearance. No distress.  HENT:  Head: Normocephalic and atraumatic.  Eyes: Conjunctivae, EOM and lids are normal. Pupils are equal, round, and reactive to light.  Neck: Normal range of motion. Neck supple. No tracheal deviation present. No thyroid mass present.  Cardiovascular: Normal rate, regular rhythm and normal heart sounds.  Exam reveals no gallop.   No murmur heard. Pulmonary/Chest: Effort normal and breath sounds normal. No stridor. No respiratory distress. She has no decreased breath sounds. She has no wheezes. She has no rhonchi. She has no rales.  Abdominal: Soft. Normal appearance and bowel sounds are normal. She exhibits no distension. There is tenderness in the epigastric area. There is no rigidity, no rebound, no guarding and  no CVA tenderness.    Musculoskeletal: Normal range of motion. She exhibits no edema or tenderness.  Neurological: She is alert and oriented to person, place, and time. She has normal strength. No cranial nerve deficit or sensory deficit. GCS eye subscore is 4. GCS verbal subscore is 5. GCS motor subscore is 6.  Skin: Skin is warm and dry. No abrasion and no rash noted.  Psychiatric: She has a normal mood and affect. Her speech is normal and behavior is normal.  Nursing note and vitals reviewed.    ED Treatments / Results  Labs (all labs ordered are listed, but only abnormal results are displayed) Labs Reviewed  COMPREHENSIVE METABOLIC PANEL - Abnormal; Notable for the following:       Result Value   Glucose, Bld 103 (*)    All other components  within normal limits  CBC - Abnormal; Notable for the following:    WBC 14.9 (*)    MCV 76.4 (*)    All other components within normal limits  LIPASE, BLOOD  URINALYSIS, ROUTINE W REFLEX MICROSCOPIC    EKG  EKG Interpretation None       Radiology No results found.  Procedures Procedures (including critical care time)  Medications Ordered in ED Medications  sodium chloride 0.9 % bolus 1,000 mL (not administered)  0.9 %  sodium chloride infusion (not administered)  HYDROmorphone (DILAUDID) injection 1 mg (not administered)  metoCLOPramide (REGLAN) injection 5 mg (not administered)  diphenhydrAMINE (BENADRYL) injection 12.5 mg (not administered)  ondansetron (ZOFRAN-ODT) disintegrating tablet 4 mg (4 mg Oral Given 09/14/16 0947)     Initial Impression / Assessment and Plan / ED Course  I have reviewed the triage vital signs and the nursing notes.  Pertinent labs & imaging results that were available during my care of the patient were reviewed by me and considered in my medical decision making (see chart for details).     Patient treated for likely GERD and feels better. Abdominal ultrasound negative. Will refer to  GI  Final Clinical Impressions(s) / ED Diagnoses   Final diagnoses:  None    New Prescriptions New Prescriptions   No medications on file     Lacretia Leigh, MD 09/14/16 1309

## 2017-02-07 ENCOUNTER — Ambulatory Visit (HOSPITAL_COMMUNITY)
Admission: EM | Admit: 2017-02-07 | Discharge: 2017-02-07 | Disposition: A | Discharge status: Home / Self Care | Payer: Self-pay | Attending: Physician Assistant | Admitting: Physician Assistant

## 2017-02-07 ENCOUNTER — Encounter (HOSPITAL_COMMUNITY): Payer: Self-pay | Admitting: Emergency Medicine

## 2017-02-07 DIAGNOSIS — L02412 Cutaneous abscess of left axilla: Secondary | ICD-10-CM

## 2017-02-07 MED ORDER — OXYCODONE HCL 5 MG PO TABS: 5.0000 mg | ORAL_TABLET | ORAL | 0 refills | Status: DC | PRN

## 2017-02-07 MED ORDER — SULFAMETHOXAZOLE-TRIMETHOPRIM 800-160 MG PO TABS: 1.0000 | ORAL_TABLET | Freq: Two times a day (BID) | ORAL | 0 refills | Status: AC

## 2017-02-07 NOTE — ED Provider Notes (Signed)
[place]    CSN: [id number] Arrival date & time: 02/07/17  1726     History   Chief Complaint Chief Complaint  Patient presents with  . Abscess    HPI Linda Sheppard is a 41 y.o. female.   41 year-old female, with history of schizophrenia, HTN, polysubstance abuse, presenting today due to possible abscess left axilla. States she believes a shirt she wore two days ago irritated the area and it has been worsening since that time. No fever or chills.    The history is provided by the patient.  Abscess  Location:  Shoulder/arm Shoulder/arm abscess location:  L axilla Size:  3 cm  Abscess quality: fluctuance, painful and warmth   Red streaking: no   Duration:  2 days Progression:  Worsening Pain details:    Quality:  Dull   Severity:  Moderate   Duration:  2 days   Timing:  Constant   Progression:  Worsening Chronicity:  New Context: not diabetes, not immunosuppression, not injected drug use, not insect bite/sting and not skin injury   Relieved by:  Nothing Worsened by:  Nothing Ineffective treatments:  None tried Associated symptoms: no anorexia, no fatigue, no fever, no headaches, no nausea and no vomiting   Risk factors: prior abscess   Risk factors: no family hx of MRSA and no hx of MRSA     Past Medical History:  Diagnosis Date  . Anemia   . Anxiety   . Arthritis   . Bipolar 1 disorder ([place])   . Depression   . GERD (gastroesophageal reflux disease)   . Hypertension   . Lipoma    left shoulder  . Obesity   . Pneumonia   . Polysubstance abuse ([place])   . Schizophrenia [facility])     Patient Active Problem List   Diagnosis Date Noted  . Lipoma of left shoulder 07/24/2016  . Wound infection after surgery 08/11/2014  . Post-operative state 07/31/2014  . Diverticulosis 07/02/2014  . Intramural leiomyoma of uterus 07/02/2014  . Pelvic pain in female 07/02/2014  . Morbid obesity ([place]) 07/02/2014  . Diverticulosis of large intestine without  hemorrhage 07/02/2014  . Fibroid uterus, with degeneration 05/25/2014  . Alcohol abuse 10/05/2012  . Cocaine abuse ([place]) 10/05/2012  . Chronic back pain greater than 3 months duration 07/16/2012  . Major depressive disorder, recurrent episode, moderate ([place]) 06/20/2012    Past Surgical History:  Procedure Laterality Date  . CESAREAN SECTION    . DENTAL SURGERY    . LIPOMA EXCISION Left 07/24/2016   Procedure: EXCISION LIPOMA LEFT SHOULDER;  Surgeon: Greer Pickerel, MD;  Location: [place];  Service: General;  Laterality: Left;  . SUPRACERVICAL ABDOMINAL HYSTERECTOMY N/A 07/31/2014   Procedure: HYSTERECTOMY SUPRACERVICAL ABDOMINAL;  Surgeon: Emily Filbert, MD;  Location: [facility];  Service: Gynecology;  Laterality: N/A;  Requested 07/31/14 @ 10:00a with Nelwyn Salisbury first assist  . TUBAL LIGATION      OB History    Gravida Para Term Preterm AB Living   5 3 1 2 1 2    SAB TAB Ectopic Multiple Live Births           3      Obstetric Comments   1- SIDS 1- Stillbirth 2 living children       Home Medications    Prior to Admission medications   Medication Sig Start Date End Date Taking? Authorizing Provider  amLODipine (NORVASC) 10 MG tablet Take 1 tablet (10 mg total) by  mouth daily. Patient taking differently: Take 10 mg by mouth daily after supper.  08/24/13  Yes Gregor Hams, MD  clonazePAM (KLONOPIN) 1 MG tablet Take 1 mg by mouth 2 (two) times daily.   Yes [provider]  dicyclomine (BENTYL) 20 MG tablet Take 1 tablet (20 mg total) by mouth 2 (two) times daily. 09/14/16  Yes Lacretia Leigh, MD  DULoxetine (CYMBALTA) 30 MG capsule Take 30 mg by mouth daily.   Yes [provider]  famotidine (PEPCID) 20 MG tablet Take 1 tablet (20 mg total) by mouth 2 (two) times daily. 09/14/16  Yes Lacretia Leigh, MD  gabapentin (NEURONTIN) 300 MG capsule Take 300 mg by mouth 3 (three) times daily.   Yes [provider]  lisinopril-hydrochlorothiazide (PRINZIDE,ZESTORETIC) 20-25 MG  tablet Take 1 tablet by mouth daily.   Yes [provider]  omeprazole (PRILOSEC) 20 MG capsule Take 1 capsule (20 mg total) by mouth daily. 07/19/16  Yes Hedges, Dellis Filbert, PA-C  oxyCODONE (ROXICODONE) 15 MG immediate release tablet Take 15 mg by mouth 4 (four) times daily. 07/27/16  Yes [provider]  sucralfate (CARAFATE) 1 g tablet Take 1 tablet (1 g total) by mouth 4 (four) times daily -  with meals and at bedtime. 07/19/16  Yes Hedges, Dellis Filbert, PA-C  sucralfate (CARAFATE) 1 g tablet Take 1 tablet (1 g total) by mouth 4 (four) times daily. 09/14/16  Yes Lacretia Leigh, MD  bismuth subsalicylate (PEPTO BISMOL) 262 MG/15ML suspension Take 30 mLs by mouth every 6 (six) hours as needed for indigestion or diarrhea or loose stools.    [provider]  chlorhexidine (PERIDEX) 0.12 % solution Use as directed 15 mLs in the mouth or throat 2 (two) times daily. Patient not taking: Reported on 09/14/2016 08/04/16   Robyn Haber, MD    Family History Family History  Problem Relation Age of Onset  . Hypertension Mother   . Osteoarthritis Mother   . Osteoarthritis Father     Social History Social History   Tobacco Use  . Smoking status: Current Every Day Smoker    Packs/day: 0.50    Types: Cigarettes  . Smokeless tobacco: Never Used  Substance Use Topics  . Alcohol use: Yes    Comment: occasional  . Drug use: Yes    Types: Cocaine    Comment: crack/ sporadic- none since 2014     Allergies   Ceftin   Review of Systems Review of Systems  Constitutional: Negative for chills, fatigue and fever.  HENT: Negative for ear pain and sore throat.   Eyes: Negative for pain and visual disturbance.  Respiratory: Negative for cough and shortness of breath.   Cardiovascular: Negative for chest pain and palpitations.  Gastrointestinal: Negative for abdominal pain, anorexia, nausea and vomiting.  Genitourinary: Negative for dysuria and hematuria.  Musculoskeletal: Negative  for arthralgias and back pain.  Skin: Positive for wound (abscess left axilla ). Negative for color change and rash.  Neurological: Negative for seizures, syncope and headaches.  All other systems reviewed and are negative.    Physical Exam Triage Vital Signs ED Triage Vitals  Enc Vitals Group     BP 02/07/17 1810 (!) 163/128     Pulse Rate 02/07/17 1810 94     Resp 02/07/17 1810 20     Temp 02/07/17 1810 99.6 F (37.6 C)     Temp Source 02/07/17 1810 Oral     SpO2 02/07/17 1810 98 %     Weight --  Height --      Head Circumference --      Peak Flow --      Pain Score 02/07/17 1814 10     Pain Loc --      Pain Edu? --      Excl. in [place]? --    No data found.  Updated Vital Signs BP (!) 163/128 (BP Location: Left Wrist) Comment: notified provider  Pulse 94   Temp 99.6 F (37.6 C) (Oral)   Resp 20   LMP 06/19/2014   SpO2 98%   Visual Acuity Right Eye Distance:   Left Eye Distance:   Bilateral Distance:    Right Eye Near:   Left Eye Near:    Bilateral Near:     Physical Exam  Constitutional: She appears well-developed and well-nourished. No distress.  HENT:  Head: Normocephalic and atraumatic.  Eyes: Conjunctivae are normal.  Neck: Neck supple.  Cardiovascular: Normal rate and regular rhythm.  No murmur heard. Pulmonary/Chest: Effort normal and breath sounds normal. No respiratory distress.  Abdominal: Soft. There is no tenderness.  Musculoskeletal: She exhibits no edema.  Neurological: She is alert.  Skin: Skin is warm and dry.  3x3 cm abscess left axilla with fluctuance   Psychiatric: She has a normal mood and affect.  Nursing note and vitals reviewed.    UC Treatments / Results  Labs (all labs ordered are listed, but only abnormal results are displayed) Labs Reviewed - No data to display  EKG  EKG Interpretation None       Radiology No results found.  Procedures Procedures (including critical care time)  Medications Ordered in  UC Medications - No data to display   Initial Impression / Assessment and Plan / UC Course  I have reviewed the triage vital signs and the nursing notes.  Pertinent labs & imaging results that were available during my care of the patient were reviewed by me and considered in my medical decision making (see chart for details).     Recommended I&D. However, the patient refused. Would like to try oral antibiotics first Will follow-up with PCP  Final Clinical Impressions(s) / UC Diagnoses   Final diagnoses:  None    ED Discharge Orders    None       Controlled Substance Prescriptions [place] Controlled Substance Registry consulted? yes   Phebe Colla, [place] 02/07/17 1832
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
## 2017-03-11 ENCOUNTER — Ambulatory Visit (HOSPITAL_COMMUNITY)
Admission: EM | Admit: 2017-03-11 | Discharge: 2017-03-11 | Disposition: A | Discharge status: Home / Self Care | Payer: Self-pay | Attending: Physician Assistant | Admitting: Physician Assistant

## 2017-03-11 ENCOUNTER — Encounter (HOSPITAL_COMMUNITY): Payer: Self-pay | Admitting: Family Medicine

## 2017-03-11 DIAGNOSIS — I10 Essential (primary) hypertension: Secondary | ICD-10-CM

## 2017-03-11 DIAGNOSIS — M791 Myalgia, unspecified site: Secondary | ICD-10-CM

## 2017-03-11 DIAGNOSIS — M545 Low back pain: Secondary | ICD-10-CM

## 2017-03-11 DIAGNOSIS — M7918 Myalgia, other site: Secondary | ICD-10-CM

## 2017-03-11 MED ORDER — HYDROCODONE-ACETAMINOPHEN 5-325 MG PO TABS: 2.0000 | ORAL_TABLET | ORAL | 0 refills | Status: DC | PRN

## 2017-03-11 MED ORDER — LISINOPRIL-HYDROCHLOROTHIAZIDE 20-25 MG PO TABS: 1.0000 | ORAL_TABLET | Freq: Every day | ORAL | 0 refills | Status: AC

## 2017-03-11 MED ORDER — AMLODIPINE BESYLATE 10 MG PO TABS: 10.0000 mg | ORAL_TABLET | Freq: Every day | ORAL | 0 refills | Status: DC
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
## 2017-03-11 NOTE — Discharge Instructions (Signed)
Call [facility] [place] [facility] and ask about Weight loss programs.  See Dr. Noah Delaine for recheck

## 2017-03-14 ENCOUNTER — Ambulatory Visit (HOSPITAL_COMMUNITY)
Admission: EM | Admit: 2017-03-14 | Discharge: 2017-03-14 | Disposition: A | Discharge status: Home / Self Care | Payer: Self-pay | Attending: Urgent Care | Admitting: Urgent Care

## 2017-03-14 ENCOUNTER — Encounter (HOSPITAL_COMMUNITY): Payer: Self-pay | Admitting: Emergency Medicine

## 2017-03-14 DIAGNOSIS — M5126 Other intervertebral disc displacement, lumbar region: Secondary | ICD-10-CM

## 2017-03-14 DIAGNOSIS — M5136 Other intervertebral disc degeneration, lumbar region: Secondary | ICD-10-CM

## 2017-03-14 DIAGNOSIS — G8929 Other chronic pain: Secondary | ICD-10-CM

## 2017-03-14 DIAGNOSIS — Z8739 Personal history of other diseases of the musculoskeletal system and connective tissue: Secondary | ICD-10-CM

## 2017-03-14 DIAGNOSIS — M549 Dorsalgia, unspecified: Secondary | ICD-10-CM

## 2017-03-14 MED ORDER — CYCLOBENZAPRINE HCL 10 MG PO TABS: 10.0000 mg | ORAL_TABLET | Freq: Three times a day (TID) | ORAL | 0 refills | Status: DC | PRN

## 2017-03-14 NOTE — Discharge Instructions (Signed)
You may try Tylenol 1,000mg  every 8 hours as needed for severe back pain. Otherwise, try 500mg  once every 6 hours as needed for your back pain. Take either Robaxin (methocarbamol) or Flexeril but not both. Let me know if you change your mind about Celebrex (celecoxib).
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
## 2017-05-13 ENCOUNTER — Encounter: Payer: Self-pay | Admitting: Family Medicine

## 2017-05-13 ENCOUNTER — Ambulatory Visit (INDEPENDENT_AMBULATORY_CARE_PROVIDER_SITE_OTHER): Payer: Self-pay | Admitting: Family Medicine

## 2017-05-13 VITALS — BP 142/92 | HR 84 | Temp 99.3°F | Resp 16 | Ht 66.5 in | Wt 371.0 lb

## 2017-05-13 DIAGNOSIS — Z135 Encounter for screening for eye and ear disorders: Secondary | ICD-10-CM

## 2017-05-13 DIAGNOSIS — R600 Localized edema: Secondary | ICD-10-CM

## 2017-05-13 DIAGNOSIS — G629 Polyneuropathy, unspecified: Secondary | ICD-10-CM

## 2017-05-13 DIAGNOSIS — F329 Major depressive disorder, single episode, unspecified: Secondary | ICD-10-CM

## 2017-05-13 DIAGNOSIS — I10 Essential (primary) hypertension: Secondary | ICD-10-CM

## 2017-05-13 DIAGNOSIS — F32A Depression, unspecified: Secondary | ICD-10-CM

## 2017-05-13 DIAGNOSIS — M533 Sacrococcygeal disorders, not elsewhere classified: Secondary | ICD-10-CM

## 2017-05-13 LAB — POCT URINALYSIS DIP (DEVICE)
Bilirubin Urine: NEGATIVE
Glucose, UA: NEGATIVE mg/dL
HGB URINE DIPSTICK: NEGATIVE
Ketones, ur: NEGATIVE mg/dL
Leukocytes, UA: NEGATIVE
NITRITE: NEGATIVE
PH: 5.5 (ref 5.0–8.0)
Protein, ur: NEGATIVE mg/dL
Specific Gravity, Urine: 1.015 (ref 1.005–1.030)
UROBILINOGEN UA: 0.2 mg/dL (ref 0.0–1.0)

## 2017-05-13 LAB — POCT GLYCOSYLATED HEMOGLOBIN (HGB A1C): Hemoglobin A1C: 5.6

## 2017-05-13 MED ORDER — DULOXETINE HCL 30 MG PO CPEP: 30.0000 mg | ORAL_CAPSULE | Freq: Every day | ORAL | 5 refills | Status: DC

## 2017-05-13 MED ORDER — AMLODIPINE BESYLATE 10 MG PO TABS: 10.0000 mg | ORAL_TABLET | Freq: Every day | ORAL | 5 refills | Status: AC

## 2017-05-13 MED ORDER — GABAPENTIN 300 MG PO CAPS: 300.0000 mg | ORAL_CAPSULE | Freq: Four times a day (QID) | ORAL | 5 refills | Status: DC

## 2017-05-13 NOTE — Progress Notes (Signed)
Chief Complaint  Patient presents with  . Establish Care  . Back Pain    chronic back pain   . Knee Pain  . Hypertension  . Medication Refill    on all meds     Subjective:    Patient ID: Linda Sheppard, female    DOB: October 24, 1975, 42 y.o.   MRN: [id number]  HPI  42 year old female with a history of hypertension, chronic back pain, and morbid obesity presents to establish care. She was a patient of Dr. Ricke Hey prior to establishing care. Patient has been out of antihypertensive medications for greater than a month.  She was taking amlodipine 10 mg and lisinopril hydrochlorothiazide 20-25 mg daily.  She  does not check blood pressures at home but says that she periodically checks blood pressure at retail establishment.  Patient does not exercise routinely but states that she follows a low-fat, low carbohydrate diet.  Body mass index is 58.98 kg/m.  Patient's cardiac risk factors include morbid obesity and chronic everyday tobacco use.  She currently denies headache, dizziness, shortness of breath, nausea, vomiting, or diarrhea.  Patient endorses bilateral lower extremity edema and reports history of fluid retention.  Patient is complaining of back pain primarily to sacrum and neck.  She reports a history of osteoarthritis.  She has been evaluated by orthopedic specialist.  She says that she was followed by [facility] prior to losing insurance benefits.  Also patient's chronic pain by her previous primary care provider.  Patient was taking oxycodone 15 mg every 4 hours for moderate to severe pain.  She says that she has been out of medication for about a month.  She is taking gabapentin 300 mg every 8 hours without sustained relief.  Patient says that pain is worsened by prolonged sitting standing or bending.  She also endorses weakness in upper extremities.  Current pain intensity 9/10 characterized as constant and throbbing.    Past Medical History:  Diagnosis Date  .  Anemia   . Anxiety   . Arthritis   . Bipolar 1 disorder ([place])   . Depression   . GERD (gastroesophageal reflux disease)   . Hypertension   . Lipoma    left shoulder  . Obesity   . Pneumonia   . Polysubstance abuse ([place])   . Schizophrenia [facility])    Social History   Socioeconomic History  . Marital status: Legally Separated    Spouse name: Not on file  . Number of children: Not on file  . Years of education: Not on file  . Highest education level: Not on file  Social Needs  . Financial resource strain: Not on file  . Food insecurity - worry: Not on file  . Food insecurity - inability: Not on file  . Transportation needs - medical: Not on file  . Transportation needs - non-medical: Not on file  Occupational History  . Not on file  Tobacco Use  . Smoking status: Current Every Day Smoker    Packs/day: 0.50    Types: Cigarettes  . Smokeless tobacco: Never Used  Substance and Sexual Activity  . Alcohol use: No    Frequency: Never  . Drug use: No    Comment:  (denies use 05/12/2017)crack/ sporadic- none since 2014  . Sexual activity: Yes    Birth control/protection: Surgical  Other Topics Concern  . Not on file  Social History Narrative  . Not on file   Review of Systems  Constitutional: Negative.  Negative for fatigue.  HENT: Negative.   Eyes: Negative.   Respiratory: Negative.   Cardiovascular: Negative.  Negative for chest pain, palpitations and leg swelling.  Gastrointestinal: Negative.   Endocrine: Negative.   Genitourinary: Negative.   Musculoskeletal: Positive for arthralgias, back pain and neck pain.  Skin: Negative.   Allergic/Immunologic: Negative.   Neurological: Negative.   Hematological: Negative.   Psychiatric/Behavioral: Negative.        Objective:   Physical Exam  Constitutional:  Morbid obesity  HENT:  Head: Normocephalic and atraumatic.  Right Ear: External ear normal.  Left Ear: External ear normal.  Mouth/Throat: Oropharynx is clear  and moist.  Eyes: Conjunctivae are normal. Pupils are equal, round, and reactive to light.  Neck: Normal range of motion. Neck supple.  Pulmonary/Chest: Effort normal and breath sounds normal.  Abdominal: Soft. Bowel sounds are normal.  Increased abdominal girth  Musculoskeletal:       Lumbar back: She exhibits decreased range of motion, tenderness, pain and spasm.      BP (!) 148/98 (BP Location: Right Arm, Patient Position: Sitting, Cuff Size: Large) Comment: manually  Pulse 84   Temp 99.3 F (37.4 C) (Oral)   Resp 16   Ht 5' 6.5" (1.689 m)   Wt (!) 371 lb (168.3 kg)   LMP 06/19/2014   SpO2 99%   BMI 58.98 kg/m  Assessment & Plan:  1. Essential hypertension Blood pressure is above goal. Will restart Amlodipine 10 mg and lisinopril-hydrochlorothiazide 20-25 mg daily. Reviewed urinalysis, no proteinuria present Blood pressure goal is less than 140/90. - POCT urinalysis dip (device) - amLODipine (NORVASC) 10 MG tablet; Take 1 tablet (10 mg total) by mouth daily after supper.  Dispense: 30 tablet; Refill: 5 - CBC with Differential - Microalbumin/Creatinine Ratio, Urine  2. Lower extremity edema  - POCT urinalysis dip (device) - Comprehensive metabolic panel - Microalbumin/Creatinine Ratio, Urine  3. Diabetic  mellitus screening  - HgB A1c  4. Sacral back pain Patient is complaining of chronic back pain.  Current pain intensity is 9 out of 10 described as constant and throbbing.  Patient is requesting prescription opiate medication.  Reviewed opiate prescribing system, patient last oxycodone 80 tablets on 05/03/2017.  Patient also received a prescription for oxycodone 10 mg 120 tablets on 03/25/2017.  Discussed with patient that I will not prescribe opiate medications in primary care.  Patient says that she has been out of medications for a month but according to substance reporting system patient recently received a prescription, thereby noting inconsistencies.  I recommend that  patient follow-up with pain clinic.  She currently does not have a payer source.  Given written information pertaining to financial assistance options. - Ambulatory referral to Pain Clinic  5. Neuropathy For ongoing neuropathy, increase gabapentin to 300 mg 4 times per day. - gabapentin (NEURONTIN) 300 MG capsule; Take 1 capsule (300 mg total) by mouth 4 (four) times daily.  Dispense: 120 capsule; Refill: 5  6. Depression, unspecified depression type Patient reports a history of depression and has been taking Cymbalta 30 mg consistently, will continue. - DULoxetine (CYMBALTA) 30 MG capsule; Take 1 capsule (30 mg total) by mouth daily.  Dispense: 30 capsule; Refill: 5  7. Morbid obesity ([place]) Body mass index is 58.98 kg/m. The patient is asked to make an attempt to improve diet and exercise patterns to aid in medical management of this problem.  - TSH   RTC: 3 months for hypertension   Donia Pounds  MSN, FNP-C [facility] [address] [place], [place] [address] [phone]    The patient was given clear instructions to go to ER or return to medical center if symptoms do not improve, worsen or new problems develop. The patient verbalized understanding.

## 2017-05-13 NOTE — Patient Instructions (Addendum)
Your blood pressure is above goal, will restart antihypertensive medications. Amlodipine 10 mg daily Lisinopril hydrochlorothiazide 20-25 milligrams daily   - Monitor blood pressure at home. Continue DASH diet. Reminder to go to the ER if any CP, SOB, nausea, dizziness, severe HA, changes vision/speech, left arm numbness and tingling and jaw pain.   Discussed chronic pain at length. Will send a referral to pain management. Increased gabapentin to 300 mg to 4 times per day.   Will follow up by phone with any abnormal lab results    [place] stands for "Dietary Approaches to Stop Hypertension." The DASH eating plan is a healthy eating plan that has been shown to reduce high blood pressure (hypertension). It may also reduce your risk for type 2 diabetes, heart disease, and stroke. The DASH eating plan may also help with weight loss. What are tips for following this plan? General guidelines  Avoid eating more than 2,300 mg (milligrams) of salt (sodium) a day. If you have hypertension, you may need to reduce your sodium intake to 1,500 mg a day.  Limit alcohol intake to no more than 1 drink a day for nonpregnant women and 2 drinks a day for men. One drink equals 12 oz of beer, 5 oz of wine, or 1 oz of hard liquor.  Work with your health care provider to maintain a healthy body weight or to lose weight. Ask what an ideal weight is for you.  Get at least 30 minutes of exercise that causes your heart to beat faster (aerobic exercise) most days of the week. Activities may include walking, swimming, or biking.  Work with your health care provider or diet and nutrition specialist (dietitian) to adjust your eating plan to your individual calorie needs. Reading food labels  Check food labels for the amount of sodium per serving. Choose foods with less than 5 percent of the Daily Value of sodium. Generally, foods with less than 300 mg of sodium per serving fit into this eating  plan.  To find whole grains, look for the word "whole" as the first word in the ingredient list. Shopping  Buy products labeled as "low-sodium" or "no salt added."  Buy fresh foods. Avoid canned foods and premade or frozen meals. Cooking  Avoid adding salt when cooking. Use salt-free seasonings or herbs instead of table salt or sea salt. Check with your health care provider or pharmacist before using salt substitutes.  Do not fry foods. Cook foods using healthy methods such as baking, boiling, grilling, and broiling instead.  Cook with heart-healthy oils, such as olive, canola, soybean, or sunflower oil. Meal planning   Eat a balanced diet that includes: ? 5 or more servings of fruits and vegetables each day. At each meal, try to fill half of your plate with fruits and vegetables. ? Up to 6-8 servings of whole grains each day. ? Less than 6 oz of lean meat, poultry, or fish each day. A 3-oz serving of meat is about the same size as a deck of cards. One egg equals 1 oz. ? 2 servings of low-fat dairy each day. ? A serving of nuts, seeds, or beans 5 times each week. ? Heart-healthy fats. Healthy fats called Omega-3 fatty acids are found in foods such as flaxseeds and coldwater fish, like sardines, salmon, and mackerel.  Limit how much you eat of the following: ? Canned or prepackaged foods. ? Food that is high in trans fat, such as fried foods. ? Food that  is high in saturated fat, such as fatty meat. ? Sweets, desserts, sugary drinks, and other foods with added sugar. ? Full-fat dairy products.  Do not salt foods before eating.  Try to eat at least 2 vegetarian meals each week.  Eat more home-cooked food and less restaurant, buffet, and fast food.  When eating at a restaurant, ask that your food be prepared with less salt or no salt, if possible. What foods are recommended? The items listed may not be a complete list. Talk with your dietitian about what dietary choices are best  for you. Grains Whole-grain or whole-wheat bread. Whole-grain or whole-wheat pasta. Brown rice. [place]. Bulgur. Whole-grain and low-sodium cereals. Pita bread. Low-fat, low-sodium crackers. Whole-wheat flour tortillas. Vegetables Fresh or frozen vegetables (raw, steamed, roasted, or grilled). Low-sodium or reduced-sodium tomato and vegetable juice. Low-sodium or reduced-sodium tomato sauce and tomato paste. Low-sodium or reduced-sodium canned vegetables. Fruits All fresh, dried, or frozen fruit. Canned fruit in natural juice (without added sugar). Meat and other protein foods Skinless chicken or [place]. Ground chicken or [place]. Pork with fat trimmed off. Fish and seafood. Egg whites. Dried beans, peas, or lentils. Unsalted nuts, nut butters, and seeds. Unsalted canned beans. Lean cuts of beef with fat trimmed off. Low-sodium, lean deli meat. Dairy Low-fat (1%) or fat-free (skim) milk. Fat-free, low-fat, or reduced-fat cheeses. Nonfat, low-sodium ricotta or cottage cheese. Low-fat or nonfat yogurt. Low-fat, low-sodium cheese. Fats and oils Soft margarine without trans fats. Vegetable oil. Low-fat, reduced-fat, or light mayonnaise and salad dressings (reduced-sodium). Canola, safflower, olive, soybean, and sunflower oils. Avocado. Seasoning and other foods Herbs. Spices. Seasoning mixes without salt. Unsalted popcorn and pretzels. Fat-free sweets. What foods are not recommended? The items listed may not be a complete list. Talk with your dietitian about what dietary choices are best for you. Grains Baked goods made with fat, such as croissants, muffins, or some breads. Dry pasta or rice meal packs. Vegetables Creamed or fried vegetables. Vegetables in a cheese sauce. Regular canned vegetables (not low-sodium or reduced-sodium). Regular canned tomato sauce and paste (not low-sodium or reduced-sodium). Regular tomato and vegetable juice (not low-sodium or reduced-sodium). Angie Fava.  Olives. Fruits Canned fruit in a light or heavy syrup. Fried fruit. Fruit in cream or butter sauce. Meat and other protein foods Fatty cuts of meat. Ribs. Fried meat. Berniece Salines. Sausage. Bologna and other processed lunch meats. Salami. Fatback. Hotdogs. Bratwurst. Salted nuts and seeds. Canned beans with added salt. Canned or smoked fish. Whole eggs or egg yolks. Chicken or [place] with skin. Dairy Whole or 2% milk, cream, and half-and-half. Whole or full-fat cream cheese. Whole-fat or sweetened yogurt. Full-fat cheese. Nondairy creamers. Whipped toppings. Processed cheese and cheese spreads. Fats and oils Butter. Stick margarine. Lard. Shortening. Ghee. Bacon fat. Tropical oils, such as coconut, palm kernel, or palm oil. Seasoning and other foods Salted popcorn and pretzels. Onion salt, garlic salt, seasoned salt, table salt, and sea salt. Worcestershire sauce. Tartar sauce. Barbecue sauce. Teriyaki sauce. Soy sauce, including reduced-sodium. Steak sauce. Canned and packaged gravies. Fish sauce. Oyster sauce. Cocktail sauce. Horseradish that you find on the shelf. Ketchup. Mustard. Meat flavorings and tenderizers. Bouillon cubes. Hot sauce and Tabasco sauce. Premade or packaged marinades. Premade or packaged taco seasonings. Relishes. Regular salad dressings. Where to find more information:  National Heart, Lung, and [place]: https://wilson-eaton.com/  American Heart Association: www.heart.org Summary  The DASH eating plan is a healthy eating plan that has been shown to reduce high blood pressure (hypertension).  It may also reduce your risk for type 2 diabetes, heart disease, and stroke.  With the DASH eating plan, you should limit salt (sodium) intake to 2,300 mg a day. If you have hypertension, you may need to reduce your sodium intake to 1,500 mg a day.  When on the DASH eating plan, aim to eat more fresh fruits and vegetables, whole grains, lean proteins, low-fat dairy, and heart-healthy  fats.  Work with your health care provider or diet and nutrition specialist (dietitian) to adjust your eating plan to your individual calorie needs. This information is not intended to replace advice given to you by your health care provider. Make sure you discuss any questions you have with your health care provider. Document Released: 02/12/2011 Document Revised: 02/17/2016 Document Reviewed: 02/17/2016 Elsevier Interactive Patient Education  2018 [employer].  Chronic Pain, Adult Chronic pain is a type of pain that lasts or keeps coming back (recurs) for at least six months. You may have chronic headaches, abdominal pain, or body pain. Chronic pain may be related to an illness, such as fibromyalgia or complex regional pain syndrome. Sometimes the cause of chronic pain is not known. Chronic pain can make it hard for you to do daily activities. If not treated, chronic pain can lead to other health problems, including anxiety and depression. Treatment depends on the cause and severity of your pain. You may need to work with a pain specialist to come up with a treatment plan. The plan may include medicine, counseling, and physical therapy. Many people benefit from a combination of two or more types of treatment to control their pain. Follow these instructions at home: Lifestyle  Consider keeping a pain diary to share with your health care providers.  Consider talking with a mental health care provider (psychologist) about how to cope with chronic pain.  Consider joining a chronic pain support group.  Try to control or lower your stress levels. Talk to your health care provider about strategies to do this. General instructions   Take over-the-counter and prescription medicines only as told by your health care provider.  Follow your treatment plan as told by your health care provider. This may include: ? Gentle, regular exercise. ? Eating a healthy diet that includes foods such as  vegetables, fruits, fish, and lean meats. ? Cognitive or behavioral therapy. ? Working with a Community education officer. ? Meditation or yoga. ? Acupuncture or massage therapy. ? Aroma, color, light, or sound therapy. ? Local electrical stimulation. ? Shots (injections) of numbing or pain-relieving medicines into the spine or the area of pain.  Check your pain level as told by your health care provider. Ask your health care provider if you should use a pain scale.  Learn as much as you can about how to manage your chronic pain. Ask your health care provider if an intensive pain rehabilitation program or a chronic pain specialist would be helpful.  Keep all follow-up visits as told by your health care provider. This is important. Contact a health care provider if:  Your pain gets worse.  You have new pain.  You have trouble sleeping.  You have trouble doing your normal activities.  Your pain is not controlled with treatment.  Your have side effects from pain medicine.  You feel weak. Get help right away if:  You lose feeling or have numbness in your body.  You lose control of bowel or bladder function.  Your pain suddenly gets much worse.  You develop shaking or  chills.  You develop confusion.  You develop chest pain.  You have trouble breathing or shortness of breath.  You pass out.  You have thoughts about hurting yourself or others. This information is not intended to replace advice given to you by your health care provider. Make sure you discuss any questions you have with your health care provider. Document Released: 11/15/2001 Document Revised: 10/24/2015 Document Reviewed: 08/13/2015 Elsevier Interactive Patient Education  [employer].

## 2017-05-14 ENCOUNTER — Telehealth: Payer: Self-pay

## 2017-05-14 LAB — TSH: TSH: 0.605 u[IU]/mL (ref 0.450–4.500)

## 2017-05-14 LAB — CBC WITH DIFFERENTIAL/PLATELET
BASOS: 0 %
Basophils Absolute: 0 10*3/uL (ref 0.0–0.2)
EOS (ABSOLUTE): 0 10*3/uL (ref 0.0–0.4)
EOS: 0 %
HEMOGLOBIN: 14 g/dL (ref 11.1–15.9)
Hematocrit: 42.1 % (ref 34.0–46.6)
IMMATURE GRANS (ABS): 0 10*3/uL (ref 0.0–0.1)
IMMATURE GRANULOCYTES: 0 %
LYMPHS: 17 %
Lymphocytes Absolute: 1.9 10*3/uL (ref 0.7–3.1)
MCH: 26.7 pg (ref 26.6–33.0)
MCHC: 33.3 g/dL (ref 31.5–35.7)
MCV: 80 fL (ref 79–97)
MONOCYTES: 5 %
Monocytes Absolute: 0.6 10*3/uL (ref 0.1–0.9)
NEUTROS ABS: 8.4 10*3/uL — AB (ref 1.4–7.0)
NEUTROS PCT: 78 %
Platelets: 287 10*3/uL (ref 150–379)
RBC: 5.25 x10E6/uL (ref 3.77–5.28)
RDW: 15.6 % — ABNORMAL HIGH (ref 12.3–15.4)
WBC: 11 10*3/uL — ABNORMAL HIGH (ref 3.4–10.8)

## 2017-05-14 LAB — COMPREHENSIVE METABOLIC PANEL
ALBUMIN: 4.2 g/dL (ref 3.5–5.5)
ALT: 11 IU/L (ref 0–32)
AST: 12 IU/L (ref 0–40)
Albumin/Globulin Ratio: 1.3 (ref 1.2–2.2)
Alkaline Phosphatase: 81 IU/L (ref 39–117)
BUN / CREAT RATIO: 11 (ref 9–23)
BUN: 12 mg/dL (ref 6–20)
Bilirubin Total: <0.2 mg/dL (ref 0.0–1.2)
CALCIUM: 9.3 mg/dL (ref 8.7–10.2)
CHLORIDE: 104 mmol/L (ref 96–106)
CO2: 22 mmol/L (ref 20–29)
CREATININE: 1.1 mg/dL — AB (ref 0.57–1.00)
GFR calc Af Amer: 73 mL/min/{1.73_m2} (ref >59)
GFR, EST NON AFRICAN AMERICAN: 63 mL/min/{1.73_m2} (ref >59)
GLUCOSE: 85 mg/dL (ref 65–99)
Globulin, Total: 3.3 g/dL (ref 1.5–4.5)
Potassium: 4.3 mmol/L (ref 3.5–5.2)
Sodium: 140 mmol/L (ref 134–144)
Total Protein: 7.5 g/dL (ref 6.0–8.5)

## 2017-05-14 NOTE — Telephone Encounter (Signed)
-----   Message from Dorena Dew, [place] sent at 05/14/2017  5:47 AM EST ----- Regarding: lab results Please inform patient that creatinine level (indicator of kidney functioning) is mildly elevated. Discuss the importance of taking anti hypertensive medications consistently. Medications were sent to [facility]. Also, recommend starting a DASH diet. Follow up as scheduled.   Thanks

## 2017-05-14 NOTE — Telephone Encounter (Signed)
Called, no answer and no voicemail set up. Will try later. Thanks!  

## 2017-05-17 NOTE — Telephone Encounter (Signed)
Called and spoke with patient, advised that creatine was mildly elevated. Discussed that it is very important to take medication consistently for hypertension and that we recommend her starting a DASH diet. Asked that she keep next scheduled follow up. Patient verbalized understanding. Thanks!

## 2017-06-09 ENCOUNTER — Telehealth: Payer: Self-pay

## 2017-06-09 NOTE — Telephone Encounter (Signed)
Called patient, she asked about creatine level. I advised of level and that there is nothing to do an this time except continue to monitor it. Advised that she can help prevent this from getting worse by taking bp meds consistently and eating a DASH diet while exercising. Patient verbalized understanding. Thanks!

## 2017-06-14 ENCOUNTER — Telehealth: Payer: Self-pay

## 2017-06-14 NOTE — Telephone Encounter (Signed)
Patient states that she needs a letter for her insurance at her job  regarding her kidneys so that she can get her visit covered to see kidney specialists.

## 2017-06-23 ENCOUNTER — Other Ambulatory Visit: Payer: Self-pay

## 2017-06-23 ENCOUNTER — Emergency Department (HOSPITAL_COMMUNITY)
Admission: EM | Admit: 2017-06-23 | Discharge: 2017-06-24 | Disposition: A | Discharge status: Home / Self Care | Payer: Medicaid Other | Attending: Emergency Medicine | Admitting: Emergency Medicine

## 2017-06-23 ENCOUNTER — Emergency Department (HOSPITAL_COMMUNITY): Payer: Medicaid Other

## 2017-06-23 DIAGNOSIS — R079 Chest pain, unspecified: Secondary | ICD-10-CM | POA: Insufficient documentation

## 2017-06-23 DIAGNOSIS — Z79899 Other long term (current) drug therapy: Secondary | ICD-10-CM | POA: Insufficient documentation

## 2017-06-23 DIAGNOSIS — F17210 Nicotine dependence, cigarettes, uncomplicated: Secondary | ICD-10-CM | POA: Insufficient documentation

## 2017-06-23 LAB — BASIC METABOLIC PANEL
Anion gap: 11 (ref 5–15)
BUN: 12 mg/dL (ref 6–20)
CO2: 24 mmol/L (ref 22–32)
Calcium: 8.8 mg/dL — ABNORMAL LOW (ref 8.9–10.3)
Chloride: 105 mmol/L (ref 101–111)
Creatinine, Ser: 0.93 mg/dL (ref 0.44–1.00)
GFR calc Af Amer: >60 mL/min (ref >60)
GLUCOSE: 125 mg/dL — AB (ref 65–99)
POTASSIUM: 3.6 mmol/L (ref 3.5–5.1)
Sodium: 140 mmol/L (ref 135–145)

## 2017-06-23 LAB — CBC
HEMATOCRIT: 37.5 % (ref 36.0–46.0)
Hemoglobin: 12.6 g/dL (ref 12.0–15.0)
MCH: 26.9 pg (ref 26.0–34.0)
MCHC: 33.6 g/dL (ref 30.0–36.0)
MCV: 80 fL (ref 78.0–100.0)
Platelets: 293 10*3/uL (ref 150–400)
RBC: 4.69 MIL/uL (ref 3.87–5.11)
RDW: 14.5 % (ref 11.5–15.5)
WBC: 8.2 10*3/uL (ref 4.0–10.5)

## 2017-06-23 LAB — I-STAT BETA HCG BLOOD, ED (MC, WL, AP ONLY): I-stat hCG, quantitative: <5 m[IU]/mL (ref <5)

## 2017-06-23 LAB — I-STAT TROPONIN, ED: Troponin i, poc: 0 ng/mL (ref 0.00–0.08)

## 2017-06-23 NOTE — ED Triage Notes (Signed)
Per Pt: Last night around 12 pt reports she started having some chest pain that radiated to her neck. Pt reports it is on the left side of her chest. Pt reports diaphoresis and trouble catching her breath when walking.

## 2017-06-24 MED ORDER — OXYCODONE-ACETAMINOPHEN 5-325 MG PO TABS
1.0000 | ORAL_TABLET | Freq: Once | ORAL | Status: AC
  Administered 2017-06-24: 1 via ORAL
  Filled 2017-06-24: qty 1

## 2017-06-24 NOTE — Discharge Instructions (Signed)
Your workup today was normal. Please follow-up with your primary care doctor.  I have attached copies of your labs for their review. Return here for any new or worsening symptoms.

## 2017-06-24 NOTE — ED Provider Notes (Signed)
[facility] DEPT Provider Note   CSN: [id number] Arrival date & time: 06/23/17  1931     History   Chief Complaint Chief Complaint  Patient presents with  . Chest Pain  . Shortness of Breath    HPI Linda Sheppard is a 42 y.o. female.  The history is provided by the patient and medical records.  Chest Pain   Associated symptoms include shortness of breath.  Shortness of Breath  Associated symptoms include chest pain.     42 year old female with history of anemia, anxiety, arthritis, bipolar disorder, depression, GERD, hypertension, schizophrenia, history of polysubstance abuse in remission, presenting to the ED with episode of chest pain.  Patient states this occurred last night while she was cleaning her home.  States she started moving a lot faster than normal and felt that her heart was racing, she felt sweaty, and short of breath.  States she sat on the couch to rest and it seemed to return back to normal.  States about 30 minutes later she developed left-sided chest pain and pain into the left shoulder.  She took some over-the-counter pain relievers and it seemed to resolve.  States throughout the day today she has had some intermittent episodes of chest pain as well, worse with movement of the left arm.  She denies any shortness of breath, diaphoresis, nausea, or vomiting with chest pain today.  States it feels like pressure and "soreness".  She denies any strenuous activity other than cleaning her house.  She denies any heavy lifting.  She has no known history of cardiac disease.  She is a daily smoker.  She has remote history of cocaine abuse, states she has not used in several years.  States she has been under a great deal of stress recently secondary to her job.  States she also had some blood work drawn at primary care office recently and told that her kidneys were not functioning well so she has been very anxious and worried about that.  Past  Medical History:  Diagnosis Date  . Anemia   . Anxiety   . Arthritis   . Bipolar 1 disorder ([place])   . Depression   . GERD (gastroesophageal reflux disease)   . Hypertension   . Lipoma    left shoulder  . Obesity   . Pneumonia   . Polysubstance abuse ([place])   . Schizophrenia [facility])     Patient Active Problem List   Diagnosis Date Noted  . Lipoma of left shoulder 07/24/2016  . Wound infection after surgery 08/11/2014  . Post-operative state 07/31/2014  . Diverticulosis 07/02/2014  . Intramural leiomyoma of uterus 07/02/2014  . Pelvic pain in female 07/02/2014  . Morbid obesity ([place]) 07/02/2014  . Diverticulosis of large intestine without hemorrhage 07/02/2014  . Fibroid uterus, with degeneration 05/25/2014  . Alcohol abuse 10/05/2012  . Cocaine abuse ([place]) 10/05/2012  . Chronic back pain greater than 3 months duration 07/16/2012  . Major depressive disorder, recurrent episode, moderate ([place]) 06/20/2012    Past Surgical History:  Procedure Laterality Date  . CESAREAN SECTION    . DENTAL SURGERY    . LIPOMA EXCISION Left 07/24/2016   Procedure: EXCISION LIPOMA LEFT SHOULDER;  Surgeon: Greer Pickerel, MD;  Location: [facility];  Service: General;  Laterality: Left;  . SUPRACERVICAL ABDOMINAL HYSTERECTOMY N/A 07/31/2014   Procedure: HYSTERECTOMY SUPRACERVICAL ABDOMINAL;  Surgeon: Emily Filbert, MD;  Location: [facility];  Service: Gynecology;  Laterality: N/A;  Requested 07/31/14 @  10:00a with Keela first assist  . TUBAL LIGATION       OB History    Gravida  5   Para  3   Term  1   Preterm  2   AB  1   Living  2     SAB      TAB      Ectopic      Multiple      Live Births  3        Obstetric Comments  1- SIDS 1- Stillbirth 2 living children         Home Medications    Prior to Admission medications   Medication Sig Start Date End Date Taking? Authorizing Provider  amLODipine (NORVASC) 10 MG tablet Take 1 tablet (10 mg total) by mouth daily after supper.  05/13/17  Yes Dorena Dew, FNP  clonazePAM (KLONOPIN) 1 MG tablet Take 1 mg by mouth 2 (two) times daily.   Yes [provider]  DULoxetine (CYMBALTA) 30 MG capsule Take 1 capsule (30 mg total) by mouth daily. 05/13/17  Yes Dorena Dew, FNP  gabapentin (NEURONTIN) 300 MG capsule Take 1 capsule (300 mg total) by mouth 4 (four) times daily. 05/13/17  Yes Dorena Dew, FNP  lisinopril-hydrochlorothiazide (PRINZIDE,ZESTORETIC) 20-25 MG tablet Take 1 tablet by mouth daily. 03/11/17  Yes Caryl Ada K, PA-C  oxyCODONE (ROXICODONE) 5 MG immediate release tablet Take 1 tablet (5 mg total) by mouth every 4 (four) hours as needed for severe pain. Patient taking differently: Take 15 mg by mouth every 4 (four) hours as needed for severe pain.  02/07/17  Yes Blue, Olivia C, PA-C  chlorhexidine (PERIDEX) 0.12 % solution Use as directed 15 mLs in the mouth or throat 2 (two) times daily. Patient not taking: Reported on 09/14/2016 08/04/16   Robyn Haber, MD  dicyclomine (BENTYL) 20 MG tablet Take 1 tablet (20 mg total) by mouth 2 (two) times daily. Patient not taking: Reported on 06/24/2017 09/14/16   Lacretia Leigh, MD  famotidine (PEPCID) 20 MG tablet Take 1 tablet (20 mg total) by mouth 2 (two) times daily. Patient not taking: Reported on 06/24/2017 09/14/16   Lacretia Leigh, MD  HYDROcodone-acetaminophen (NORCO/VICODIN) 5-325 MG tablet Take 2 tablets by mouth every 4 (four) hours as needed. Patient not taking: Reported on 05/13/2017 03/11/17   Fransico Meadow, PA-C  omeprazole (PRILOSEC) 20 MG capsule Take 1 capsule (20 mg total) by mouth daily. Patient not taking: Reported on 06/24/2017 07/19/16   Hedges, Dellis Filbert, PA-C  risperiDONE (RISPERDAL) 0.5 MG tablet Take 1 tablet (0.5 mg total) by mouth at bedtime. For mood control 06/24/12 07/10/12  Niel Hummer, NP    Family History Family History  Problem Relation Age of Onset  . Hypertension Mother   . Osteoarthritis Mother   . Osteoarthritis Father      Social History Social History   Tobacco Use  . Smoking status: Current Every Day Smoker    Packs/day: 0.50    Types: Cigarettes  . Smokeless tobacco: Never Used  Substance Use Topics  . Alcohol use: No    Frequency: Never  . Drug use: No    Types: Cocaine    Comment:  (denies use 05/12/2017)crack/ sporadic- none since 2014     Allergies   Ceftin   Review of Systems Review of Systems  Respiratory: Positive for shortness of breath.   Cardiovascular: Positive for chest pain.  All other systems reviewed and are negative.  Physical Exam Updated Vital Signs BP (!) 138/96 (BP Location: Right Arm)   Pulse 77   Temp 99.3 F (37.4 C) (Oral)   Resp 18   Ht 5' 6.5" (1.689 m)   Wt (!) 164.7 kg (363 lb)   LMP 06/19/2014   SpO2 100%   BMI 57.71 kg/m   Physical Exam  Constitutional: She is oriented to person, place, and time. She appears well-developed and well-nourished.  Morbidly obese  HENT:  Head: Normocephalic and atraumatic.  Mouth/Throat: Oropharynx is clear and moist.  Eyes: Pupils are equal, round, and reactive to light. Conjunctivae and EOM are normal.  Neck: Normal range of motion.  Cardiovascular: Normal rate, regular rhythm and normal heart sounds.  Pulmonary/Chest: Effort normal and breath sounds normal. No accessory muscle usage. No tachypnea. She has no decreased breath sounds. She has no wheezes. She has no rhonchi.  Abdominal: Soft. Bowel sounds are normal. There is no tenderness. There is no rebound.  Musculoskeletal: Normal range of motion.  Reproducible pain of the left trapezius, there is no spasm, no deformity  Neurological: She is alert and oriented to person, place, and time.  Skin: Skin is warm and dry.  Psychiatric:  Very odd affect, staring off into space during exam  Nursing note and vitals reviewed.    ED Treatments / Results  Labs (all labs ordered are listed, but only abnormal results are displayed) Labs Reviewed  BASIC  METABOLIC PANEL - Abnormal; Notable for the following components:      Result Value   Glucose, Bld 125 (*)    Calcium 8.8 (*)    All other components within normal limits  CBC  I-STAT TROPONIN, ED  I-STAT BETA HCG BLOOD, ED ([facility], [facility], [facility] ONLY)    EKG EKG Interpretation  Date/Time:  Wednesday June 23 2017 19:46:10 EDT Ventricular Rate:  94 PR Interval:  142 QRS Duration: 84 QT Interval:  390 QTC Calculation: 487 R Axis:   76 Text Interpretation:  Normal sinus rhythm Anterior infarct , age undetermined Abnormal ECG since last tracing no significant change Confirmed by Daleen Bo [phone]) on 06/23/2017 8:14:39 PM   Radiology Dg Chest 2 View  Result Date: 06/23/2017 CLINICAL DATA:  42 y/o F; left-sided chest pain radiating to the neck. EXAM: CHEST - 2 VIEW COMPARISON:  04/13/2016 chest radiograph FINDINGS: Stable heart size and mediastinal contours are within normal limits. Both lungs are clear. The visualized skeletal structures are unremarkable. IMPRESSION: No acute pulmonary process identified. Electronically Signed   By: Kristine Garbe M.D.   On: 06/23/2017 20:41    Procedures Procedures (including critical care time)  Medications Ordered in ED Medications - No data to display   Initial Impression / Assessment and Plan / ED Course  I have reviewed the triage vital signs and the nursing notes.  Pertinent labs & imaging results that were available during my care of the patient were reviewed by me and considered in my medical decision making (see chart for details).  41 y.o. F here with episode of chest pain.  She is afebrile and nontoxic.  Exam is overall benign aside from some reproducible pain of her left trapezius muscles.  EKG sinus rhythm, no acute ischemic changes.  Labs are reassuring, troponin negative.  Has history of cocaine abuse, reports none in the past several years.  She has no known cardiac history.  Symptoms are somewhat atypical.  Low suspicion for  ACS, PE, dissection, acute cardiac event.  Low risk heart score  of 2.  Feel she is stable for outpatient management.  We will have her follow-up closely with her primary care doctor.  She was given copies of her labs for physician review she previously had some renal impairment, creatinine is normal today.  She understands to return here for any new or acute changes.   Final Clinical Impressions(s) / ED Diagnoses   Final diagnoses:  Chest pain in adult    ED Discharge Orders    None       Larene Pickett, PA-C 06/24/17 0149    Molpus, Jenny Reichmann, MD 06/24/17 [phone]

## 2017-07-25 ENCOUNTER — Encounter (HOSPITAL_COMMUNITY): Payer: Self-pay | Admitting: Emergency Medicine

## 2017-07-25 ENCOUNTER — Emergency Department (HOSPITAL_COMMUNITY)
Admission: EM | Admit: 2017-07-25 | Discharge: 2017-07-25 | Disposition: A | Discharge status: Home / Self Care | Payer: Self-pay | Attending: Emergency Medicine | Admitting: Emergency Medicine

## 2017-07-25 DIAGNOSIS — Z5321 Procedure and treatment not carried out due to patient leaving prior to being seen by health care provider: Secondary | ICD-10-CM | POA: Insufficient documentation

## 2017-07-25 DIAGNOSIS — K0889 Other specified disorders of teeth and supporting structures: Secondary | ICD-10-CM | POA: Insufficient documentation

## 2017-07-25 NOTE — ED Notes (Signed)
Called Pt to be roomed, no response in lobby x1.

## 2017-07-25 NOTE — ED Notes (Signed)
Called Pt to be roomed, no response x2.

## 2017-07-25 NOTE — ED Notes (Signed)
No answer from lobby  

## 2017-07-25 NOTE — ED Triage Notes (Addendum)
Patient here from home with complaint of left upper dental pain. Hx of same. Also reports pelvic pain, bleeding unsure if it is vaginal bleeding or blood in urine. States that she hasnt had a cycle since 2016. Also complains of hypertension. Out of meds.

## 2017-07-26 NOTE — ED Notes (Signed)
Follow up call complete   07/26/17      1215  s Hamed Debella rn

## 2017-08-12 ENCOUNTER — Telehealth: Payer: Self-pay

## 2017-08-12 ENCOUNTER — Ambulatory Visit: Payer: Medicaid Other | Admitting: Family Medicine

## 2017-08-12 NOTE — Telephone Encounter (Signed)
Called patient and provided her with her 05/13/2017 lab results and advised her to review with provider at next visit.

## 2017-08-18 ENCOUNTER — Ambulatory Visit: Payer: Medicaid Other | Admitting: Family Medicine

## 2018-01-09 ENCOUNTER — Encounter (HOSPITAL_COMMUNITY): Payer: Self-pay

## 2018-01-09 ENCOUNTER — Ambulatory Visit (HOSPITAL_COMMUNITY)
Admission: EM | Admit: 2018-01-09 | Discharge: 2018-01-09 | Disposition: A | Discharge status: Home / Self Care | Payer: Medicaid Other | Attending: Family Medicine | Admitting: Family Medicine

## 2018-01-09 ENCOUNTER — Other Ambulatory Visit: Payer: Self-pay

## 2018-01-09 DIAGNOSIS — M545 Low back pain, unspecified: Secondary | ICD-10-CM

## 2018-01-09 DIAGNOSIS — G8929 Other chronic pain: Secondary | ICD-10-CM

## 2018-01-09 MED ORDER — KETOROLAC TROMETHAMINE 60 MG/2ML IM SOLN
INTRAMUSCULAR | Status: AC
  Filled 2018-01-09: qty 2

## 2018-01-09 MED ORDER — KETOROLAC TROMETHAMINE 60 MG/2ML IM SOLN
60.0000 mg | Freq: Once | INTRAMUSCULAR | Status: AC
  Administered 2018-01-09: 60 mg via INTRAMUSCULAR

## 2018-01-09 NOTE — Discharge Instructions (Signed)
Continue with your prednisone and flexeril as prescribed.  Light and regular activity as tolerated.  Continue to follow up with a primary care provider for management of your chronic pain.
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
## 2018-10-21 ENCOUNTER — Other Ambulatory Visit: Payer: Self-pay

## 2018-10-21 ENCOUNTER — Ambulatory Visit (HOSPITAL_COMMUNITY)
Admission: EM | Admit: 2018-10-21 | Discharge: 2018-10-21 | Disposition: A | Discharge status: Home / Self Care | Payer: Self-pay | Attending: Physician Assistant | Admitting: Physician Assistant

## 2018-10-21 ENCOUNTER — Encounter (HOSPITAL_COMMUNITY): Payer: Self-pay | Admitting: Emergency Medicine

## 2018-10-21 ENCOUNTER — Ambulatory Visit (INDEPENDENT_AMBULATORY_CARE_PROVIDER_SITE_OTHER): Payer: Self-pay

## 2018-10-21 DIAGNOSIS — S161XXA Strain of muscle, fascia and tendon at neck level, initial encounter: Secondary | ICD-10-CM

## 2018-10-21 NOTE — Discharge Instructions (Addendum)
Follow up with PCP for referral to physical therapy or ortho or for advanced imaging. Continue ibuprofen ever 6 to 8 hours as needed for pain. Ice and heat neck 15 minutes 4 times per day.
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
## 2018-11-08 ENCOUNTER — Emergency Department (HOSPITAL_COMMUNITY): Payer: BLUE CROSS/BLUE SHIELD

## 2018-11-08 ENCOUNTER — Encounter (HOSPITAL_COMMUNITY): Payer: Self-pay | Admitting: Family Medicine

## 2018-11-08 ENCOUNTER — Other Ambulatory Visit: Payer: Self-pay

## 2018-11-08 ENCOUNTER — Emergency Department (HOSPITAL_COMMUNITY)
Admission: EM | Admit: 2018-11-08 | Discharge: 2018-11-09 | Disposition: A | Discharge status: Home / Self Care | Payer: BLUE CROSS/BLUE SHIELD | Attending: Emergency Medicine | Admitting: Emergency Medicine

## 2018-11-08 DIAGNOSIS — Z6841 Body Mass Index (BMI) 40.0 and over, adult: Secondary | ICD-10-CM | POA: Diagnosis not present

## 2018-11-08 DIAGNOSIS — R0789 Other chest pain: Secondary | ICD-10-CM | POA: Diagnosis not present

## 2018-11-08 DIAGNOSIS — M25562 Pain in left knee: Secondary | ICD-10-CM | POA: Insufficient documentation

## 2018-11-08 DIAGNOSIS — M5489 Other dorsalgia: Secondary | ICD-10-CM | POA: Diagnosis not present

## 2018-11-08 DIAGNOSIS — Z79899 Other long term (current) drug therapy: Secondary | ICD-10-CM | POA: Diagnosis not present

## 2018-11-08 DIAGNOSIS — F17210 Nicotine dependence, cigarettes, uncomplicated: Secondary | ICD-10-CM | POA: Diagnosis not present

## 2018-11-08 DIAGNOSIS — I10 Essential (primary) hypertension: Secondary | ICD-10-CM | POA: Insufficient documentation

## 2018-11-08 DIAGNOSIS — R609 Edema, unspecified: Secondary | ICD-10-CM

## 2018-11-08 DIAGNOSIS — R600 Localized edema: Secondary | ICD-10-CM | POA: Diagnosis not present

## 2018-11-08 DIAGNOSIS — R0602 Shortness of breath: Secondary | ICD-10-CM | POA: Diagnosis not present

## 2018-11-08 DIAGNOSIS — M549 Dorsalgia, unspecified: Secondary | ICD-10-CM

## 2018-11-08 NOTE — ED Triage Notes (Signed)
Patient is complaining of shortness of breath, lower extremity swelling, and left lower back pain. Symptoms started on Saturday. Patient is ambulatory to the triage room.

## 2018-11-09 ENCOUNTER — Encounter (HOSPITAL_COMMUNITY): Payer: Self-pay

## 2018-11-09 ENCOUNTER — Emergency Department (HOSPITAL_COMMUNITY): Payer: BLUE CROSS/BLUE SHIELD

## 2018-11-09 LAB — CBC WITH DIFFERENTIAL/PLATELET
Abs Immature Granulocytes: 0.21 10*3/uL — ABNORMAL HIGH (ref 0.00–0.07)
Basophils Absolute: 0.1 10*3/uL (ref 0.0–0.1)
Basophils Relative: 0 %
Eosinophils Absolute: 0.3 10*3/uL (ref 0.0–0.5)
Eosinophils Relative: 2 %
HCT: 35.8 % — ABNORMAL LOW (ref 36.0–46.0)
Hemoglobin: 11.8 g/dL — ABNORMAL LOW (ref 12.0–15.0)
Immature Granulocytes: 2 %
Lymphocytes Relative: 17 %
Lymphs Abs: 2.3 10*3/uL (ref 0.7–4.0)
MCH: 26.6 pg (ref 26.0–34.0)
MCHC: 33 g/dL (ref 30.0–36.0)
MCV: 80.8 fL (ref 80.0–100.0)
Monocytes Absolute: 0.9 10*3/uL (ref 0.1–1.0)
Monocytes Relative: 6 %
Neutro Abs: 10.2 10*3/uL — ABNORMAL HIGH (ref 1.7–7.7)
Neutrophils Relative %: 73 %
Platelets: 279 10*3/uL (ref 150–400)
RBC: 4.43 MIL/uL (ref 3.87–5.11)
RDW: 14.1 % (ref 11.5–15.5)
WBC: 13.9 10*3/uL — ABNORMAL HIGH (ref 4.0–10.5)
nRBC: 0.2 % (ref 0.0–0.2)

## 2018-11-09 LAB — COMPREHENSIVE METABOLIC PANEL
ALT: 16 U/L (ref 0–44)
AST: 18 U/L (ref 15–41)
Albumin: 3.6 g/dL (ref 3.5–5.0)
Alkaline Phosphatase: 76 U/L (ref 38–126)
Anion gap: 10 (ref 5–15)
BUN: 15 mg/dL (ref 6–20)
CO2: 27 mmol/L (ref 22–32)
Calcium: 9 mg/dL (ref 8.9–10.3)
Chloride: 99 mmol/L (ref 98–111)
Creatinine, Ser: 0.97 mg/dL (ref 0.44–1.00)
GFR calc Af Amer: >60 mL/min (ref >60)
GFR calc non Af Amer: >60 mL/min (ref >60)
Glucose, Bld: 104 mg/dL — ABNORMAL HIGH (ref 70–99)
Potassium: 3.7 mmol/L (ref 3.5–5.1)
Sodium: 136 mmol/L (ref 135–145)
Total Bilirubin: 0.4 mg/dL (ref 0.3–1.2)
Total Protein: 7.3 g/dL (ref 6.5–8.1)

## 2018-11-09 LAB — BRAIN NATRIURETIC PEPTIDE: B Natriuretic Peptide: 22.9 pg/mL (ref 0.0–100.0)

## 2018-11-09 LAB — D-DIMER, QUANTITATIVE: D-Dimer, Quant: 1.59 ug/mL-FEU — ABNORMAL HIGH (ref 0.00–0.50)

## 2018-11-09 MED ORDER — FUROSEMIDE 40 MG PO TABS: 40.0000 mg | ORAL_TABLET | Freq: Every day | ORAL | 0 refills | Status: DC

## 2018-11-09 MED ORDER — HYDROMORPHONE HCL 1 MG/ML IJ SOLN
1.0000 mg | Freq: Once | INTRAMUSCULAR | Status: AC
  Administered 2018-11-09: 03:00:00 1 mg via INTRAVENOUS
  Filled 2018-11-09: qty 1

## 2018-11-09 MED ORDER — IOHEXOL 350 MG/ML SOLN
100.0000 mL | Freq: Once | INTRAVENOUS | Status: AC | PRN
  Administered 2018-11-09: 100 mL via INTRAVENOUS

## 2018-11-09 MED ORDER — SODIUM CHLORIDE (PF) 0.9 % IJ SOLN
INTRAMUSCULAR | Status: AC
  Filled 2018-11-09: qty 50

## 2018-11-09 MED ORDER — CYCLOBENZAPRINE HCL 10 MG PO TABS: 10.0000 mg | ORAL_TABLET | Freq: Three times a day (TID) | ORAL | 0 refills | Status: DC | PRN

## 2018-11-09 MED ORDER — ONDANSETRON HCL 4 MG/2ML IJ SOLN
4.0000 mg | Freq: Once | INTRAMUSCULAR | Status: AC
  Administered 2018-11-09: 03:00:00 4 mg via INTRAVENOUS
  Filled 2018-11-09: qty 2

## 2018-11-09 NOTE — ED Provider Notes (Signed)
[facility] [facility] DEPT Provider Note   CSN: [id number] Arrival date & time: 11/08/18  2119    History   Chief Complaint Chief Complaint  Patient presents with  . Back Pain  . Leg Swelling  . Shortness of Breath    HPI Linda Sheppard is a 43 y.o. female.   The history is provided by the patient.  Back Pain Shortness of Breath She has a history of hypertension, GERD, bipolar disorder, substance abuse and comes in with complaints of bilateral leg swelling for the last 3 days.  There is associated shortness of breath when she exerts herself, but not when she lays flat.  She denies chest pain, heaviness, tightness, pressure.  There has been a very slight cough productive of a very small amount of clear sputum.  She denies fever, chills, sweats.  She also has complained of pain in her left lower rib cage posteriorly over the same time.  She has a history of chronic back pain secondary to a ruptured disc, but states that this pain is different.  And feels like there is a muscle that is tight that she stretches if she moves a certain way.  She is also complaining of pain in her left knee.  At times, it feels like something is popping in the knee.  Is worse with any movement.  Overall, she rates her pain at 8/10.  She takes oxycodone 15 mg 4 times a day, but it is not helping.  Of note, she has had problems in her right knee, and she has been favoring it for several years.  She denies any recent trauma.  Past Medical History:  Diagnosis Date  . Anemia   . Anxiety   . Arthritis   . Bipolar 1 disorder ([place])   . Depression   . GERD (gastroesophageal reflux disease)   . Hypertension   . Lipoma    left shoulder  . Obesity   . Pneumonia   . Polysubstance abuse ([place])   . Schizophrenia [facility])     Patient Active Problem List   Diagnosis Date Noted  . Lipoma of left shoulder 07/24/2016  . Wound infection after surgery 08/11/2014  . Post-operative state 07/31/2014   . Diverticulosis 07/02/2014  . Intramural leiomyoma of uterus 07/02/2014  . Pelvic pain in female 07/02/2014  . Morbid obesity ([place]) 07/02/2014  . Diverticulosis of large intestine without hemorrhage 07/02/2014  . Fibroid uterus, with degeneration 05/25/2014  . Alcohol abuse 10/05/2012  . Cocaine abuse ([place]) 10/05/2012  . Chronic back pain greater than 3 months duration 07/16/2012  . Major depressive disorder, recurrent episode, moderate ([place]) 06/20/2012    Past Surgical History:  Procedure Laterality Date  . CESAREAN SECTION    . DENTAL SURGERY    . LIPOMA EXCISION Left 07/24/2016   Procedure: EXCISION LIPOMA LEFT SHOULDER;  Surgeon: Greer Pickerel, MD;  Location: [place];  Service: General;  Laterality: Left;  . SUPRACERVICAL ABDOMINAL HYSTERECTOMY N/A 07/31/2014   Procedure: HYSTERECTOMY SUPRACERVICAL ABDOMINAL;  Surgeon: Emily Filbert, MD;  Location: [place] [facility] [facility];  Service: Gynecology;  Laterality: N/A;  Requested 07/31/14 @ 10:00a with Nelwyn Salisbury first assist  . TUBAL LIGATION       OB History    Gravida  5   Para  3   Term  1   Preterm  2   AB  1   Living  2     SAB      TAB  Ectopic      Multiple      Live Births  3        Obstetric Comments  1- SIDS 1- Stillbirth 2 living children         Home Medications    Prior to Admission medications   Medication Sig Start Date End Date Taking? Authorizing Provider  amLODipine (NORVASC) 10 MG tablet Take 1 tablet (10 mg total) by mouth daily after supper. 05/13/17  Yes Dorena Dew, FNP  lisinopril-hydrochlorothiazide (PRINZIDE,ZESTORETIC) 20-25 MG tablet Take 1 tablet by mouth daily. 03/11/17  Yes Caryl Ada K, PA-C  oxyCODONE (ROXICODONE) 5 MG immediate release tablet Take 1 tablet (5 mg total) by mouth every 4 (four) hours as needed for severe pain. Patient taking differently: Take 15 mg by mouth every 4 (four) hours as needed for severe pain.  02/07/17  Yes Blue, Olivia C, PA-C  QUEtiapine (SEROQUEL) 50 MG tablet  Take 50 mg by mouth at bedtime.   Yes [provider]  chlorhexidine (PERIDEX) 0.12 % solution Use as directed 15 mLs in the mouth or throat 2 (two) times daily. Patient not taking: Reported on 09/14/2016 08/04/16   Robyn Haber, MD  dicyclomine (BENTYL) 20 MG tablet Take 1 tablet (20 mg total) by mouth 2 (two) times daily. Patient not taking: Reported on 06/24/2017 09/14/16   Lacretia Leigh, MD  DULoxetine (CYMBALTA) 30 MG capsule Take 1 capsule (30 mg total) by mouth daily. Patient not taking: Reported on 11/09/2018 05/13/17   Dorena Dew, FNP  famotidine (PEPCID) 20 MG tablet Take 1 tablet (20 mg total) by mouth 2 (two) times daily. Patient not taking: Reported on 06/24/2017 09/14/16   Lacretia Leigh, MD  gabapentin (NEURONTIN) 300 MG capsule Take 1 capsule (300 mg total) by mouth 4 (four) times daily. Patient not taking: Reported on 11/09/2018 05/13/17   Dorena Dew, FNP  HYDROcodone-acetaminophen (NORCO/VICODIN) 5-325 MG tablet Take 2 tablets by mouth every 4 (four) hours as needed. Patient not taking: Reported on 05/13/2017 03/11/17   Fransico Meadow, PA-C  omeprazole (PRILOSEC) 20 MG capsule Take 1 capsule (20 mg total) by mouth daily. Patient not taking: Reported on 06/24/2017 07/19/16   Okey Regal, PA-C    Family History Family History  Problem Relation Age of Onset  . Hypertension Mother   . Osteoarthritis Mother   . Osteoarthritis Father     Social History Social History   Tobacco Use  . Smoking status: Current Every Day Smoker    Packs/day: 0.50    Types: Cigarettes  . Smokeless tobacco: Never Used  Substance Use Topics  . Alcohol use: No    Frequency: Never  . Drug use: No    Types: Cocaine    Comment:  (denies use 05/12/2017)crack/ sporadic- none since 2014     Allergies   Ceftin   Review of Systems Review of Systems  Respiratory: Positive for shortness of breath.   Musculoskeletal: Positive for back pain.  All other systems reviewed and are  negative.    Physical Exam Updated Vital Signs BP 123/67   Pulse 92   Temp 98.5 F (36.9 C) (Oral)   Resp 17   Ht 5' 6.5" (1.689 m)   Wt (!) 165.1 kg   LMP 06/19/2014   SpO2 97%   BMI 57.87 kg/m   Physical Exam Vitals signs and nursing note reviewed.    Morbidly obese 43 year old female, resting comfortably and in no acute distress. Vital signs  are normal. Oxygen saturation is 97%, which is normal. Head is normocephalic and atraumatic. PERRLA, EOMI. Oropharynx is clear. Neck is nontender and supple without adenopathy or JVD. Back is nontender and there is no CVA tenderness. Lungs are clear without rales, wheezes, or rhonchi. Chest is nontender. Heart has regular rate and rhythm without murmur. Abdomen is soft, flat, nontender without masses or hepatosplenomegaly and peristalsis is normoactive. Extremities have 2+ pitting pedal and pretibial edema.  Calf circumference is symmetric.  There are no cords palpable.  Homans sign is negative.  There is no obvious swelling of the left knee, but this is difficult to assess due to body habitus.  No effusion is detected.  There is no instability on valgus or varus stress.  Lachman and McMurray's tests are grossly negative although she does complain of pain with performing McMurray's test. Skin is warm and dry without rash. Neurologic: Mental status is normal, cranial nerves are intact, there are no motor or sensory deficits.  ED Treatments / Results  Labs (all labs ordered are listed, but only abnormal results are displayed) Labs Reviewed  COMPREHENSIVE METABOLIC PANEL - Abnormal; Notable for the following components:      Result Value   Glucose, Bld 104 (*)    All other components within normal limits  CBC WITH DIFFERENTIAL/PLATELET - Abnormal; Notable for the following components:   WBC 13.9 (*)    Hemoglobin 11.8 (*)    HCT 35.8 (*)    Neutro Abs 10.2 (*)    Abs Immature Granulocytes 0.21 (*)    All other components within  normal limits  D-DIMER, QUANTITATIVE (NOT AT [facility]) - Abnormal; Notable for the following components:   D-Dimer, Quant 1.59 (*)    All other components within normal limits  BRAIN NATRIURETIC PEPTIDE    EKG EKG Interpretation  Date/Time:  Tuesday November 08 2018 22:05:23 EDT Ventricular Rate:  95 PR Interval:    QRS Duration: 92 QT Interval:  350 QTC Calculation: 440 R Axis:   81 Text Interpretation:  Sinus rhythm Anterior infarct, old No significant change since last tracing Confirmed by Dorie Rank [phone]) on 11/08/2018 10:08:04 PM   Radiology Dg Chest 2 View  Result Date: 11/08/2018 CLINICAL DATA:  Shortness of breath EXAM: CHEST - 2 VIEW COMPARISON:  06/23/2017 FINDINGS: No pleural effusion. Streaky perihilar opacity and mild right basilar ground-glass opacity. Stable cardiomediastinal silhouette. No pneumothorax. IMPRESSION: Streaky perihilar and mild ground-glass opacity at the right base, consider atypical respiratory infection. Electronically Signed   By: Donavan Foil M.D.   On: 11/08/2018 22:50    Procedures Procedures   Medications Ordered in ED Medications  sodium chloride (PF) 0.9 % injection (has no administration in time range)  HYDROmorphone (DILAUDID) injection 1 mg (1 mg Intravenous Given 11/09/18 0326)  ondansetron (ZOFRAN) injection 4 mg (4 mg Intravenous Given 11/09/18 0326)  iohexol (OMNIPAQUE) 350 MG/ML injection 100 mL (100 mLs Intravenous Contrast Given 11/09/18 0413)     Initial Impression / Assessment and Plan / ED Course  I have reviewed the triage vital signs and the nursing notes.  Pertinent labs & imaging results that were available during my care of the patient were reviewed by me and considered in my medical decision making (see chart for details).  Peripheral edema, possibly right-sided heart failure.  Chest x-ray does show mild cardiomegaly as well as report of groundglass density at the right base.  Clinically, she does not have pneumonia.  Back  pain seems muscular.  Knee  pain is likely secondary to arthritis.  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  Will check knee x-ray and will also check screening labs including BNP and d-dimer.  Screening labs are normal except for d-dimer which is moderately elevated.  She is sent for CT angiogram of the chest which shows no evidence of pulmonary embolism, some evidence of dependent atelectasis.  She is discharged with prescription for furosemide, recommended close follow-up with PCP to monitor ongoing diuretic use.  Also given prescription for cyclobenzaprine to treat her muscle spasm in the left mid back.  Final Clinical Impressions(s) / ED Diagnoses   Final diagnoses:  Peripheral edema  Musculoskeletal back pain  Morbid obesity [facility])    ED Discharge Orders         Ordered    furosemide (LASIX) 40 MG tablet  Daily     11/09/18 0603    cyclobenzaprine (FLEXERIL) 10 MG tablet  3 times daily PRN     11/09/18 0000000           Delora Fuel, MD [id number] [phone]

## 2018-11-09 NOTE — Discharge Instructions (Addendum)
Apply ice to the sore area in your back - keep it on for thirty minutes at a time, four times a day.  Please weigh yourself once a day. Any weight change will be from fluid loss. You will need to work with your primary care provider to find your "dry weight", and use that to help guide when you take the fluid pill.

## 2018-11-09 NOTE — ED Notes (Signed)
IV team at bedside 

## 2018-11-09 NOTE — ED Notes (Signed)
Pt ambulated to the restroom.

## 2018-11-09 NOTE — ED Notes (Signed)
Patient transported to CT 

## 2018-11-09 NOTE — ED Notes (Signed)
Patient transported to X-ray 

## 2019-03-15 ENCOUNTER — Other Ambulatory Visit: Payer: Self-pay

## 2019-03-15 ENCOUNTER — Emergency Department (HOSPITAL_COMMUNITY)
Admission: EM | Admit: 2019-03-15 | Discharge: 2019-03-15 | Disposition: A | Discharge status: Home / Self Care | Payer: BLUE CROSS/BLUE SHIELD | Attending: Emergency Medicine | Admitting: Emergency Medicine

## 2019-03-15 ENCOUNTER — Encounter (HOSPITAL_COMMUNITY): Payer: Self-pay

## 2019-03-15 DIAGNOSIS — I10 Essential (primary) hypertension: Secondary | ICD-10-CM | POA: Insufficient documentation

## 2019-03-15 DIAGNOSIS — Z79899 Other long term (current) drug therapy: Secondary | ICD-10-CM | POA: Insufficient documentation

## 2019-03-15 DIAGNOSIS — F17210 Nicotine dependence, cigarettes, uncomplicated: Secondary | ICD-10-CM | POA: Insufficient documentation

## 2019-03-15 DIAGNOSIS — J039 Acute tonsillitis, unspecified: Secondary | ICD-10-CM | POA: Insufficient documentation

## 2019-03-15 LAB — GROUP A STREP BY PCR: Group A Strep by PCR: NOT DETECTED

## 2019-03-15 MED ORDER — CLINDAMYCIN HCL 150 MG PO CAPS: 300.0000 mg | ORAL_CAPSULE | Freq: Three times a day (TID) | ORAL | 0 refills | Status: AC

## 2019-03-15 MED ORDER — DEXAMETHASONE SODIUM PHOSPHATE 10 MG/ML IJ SOLN
10.0000 mg | Freq: Once | INTRAMUSCULAR | Status: AC
  Administered 2019-03-15: 05:00:00 10 mg via INTRAMUSCULAR
  Filled 2019-03-15: qty 1

## 2019-03-15 MED ORDER — LIDOCAINE VISCOUS HCL 2 % MT SOLN
15.0000 mL | Freq: Once | OROMUCOSAL | Status: AC
  Administered 2019-03-15: 05:00:00 15 mL via ORAL
  Filled 2019-03-15: qty 15

## 2019-03-15 MED ORDER — KETOROLAC TROMETHAMINE 30 MG/ML IJ SOLN
30.0000 mg | Freq: Once | INTRAMUSCULAR | Status: AC
  Administered 2019-03-15: 05:00:00 30 mg via INTRAMUSCULAR
  Filled 2019-03-15: qty 1

## 2019-03-15 MED ORDER — CLINDAMYCIN HCL 150 MG PO CAPS: 300.0000 mg | ORAL_CAPSULE | Freq: Three times a day (TID) | ORAL | 0 refills | Status: DC

## 2019-03-15 MED ORDER — CHLORASEPTIC 1.4 % MT LIQD: 2.0000 | OROMUCOSAL | 0 refills | Status: DC | PRN

## 2019-03-15 MED ORDER — CLINDAMYCIN HCL 300 MG PO CAPS
300.0000 mg | ORAL_CAPSULE | Freq: Once | ORAL | Status: AC
  Administered 2019-03-15: 05:00:00 300 mg via ORAL
  Filled 2019-03-15: qty 1

## 2019-03-15 MED ORDER — ALUM & MAG HYDROXIDE-SIMETH 200-200-20 MG/5ML PO SUSP
30.0000 mL | Freq: Once | ORAL | Status: AC
  Administered 2019-03-15: 30 mL via ORAL
  Filled 2019-03-15: qty 30

## 2019-03-15 NOTE — ED Provider Notes (Signed)
[facility] DEPT Provider Note   CSN: [id number] Arrival date & time: 03/15/19  [id number]     History No chief complaint on file.   Linda Sheppard is a 44 y.o. female.   44 year old female presenting to the emergency department for complaints of sore throat which began yesterday.  Symptoms have been constant.  Pain is aggravated with swallowing as well as eating and drinking.  She has taken ibuprofen with only minimal relief of her dysphagia.  Feels as though the right side of her neck has become more swollen tonight.  Reports feeling as though it was difficult to breathe tonight when she went to go to sleep.  This prompted her assessment in the ED.  She does have primary care follow-up scheduled for today.  Tmax PTA 100.53F.  Patient is prescribed chronic oxycodone by pain management.       Past Medical History:  Diagnosis Date  . Anemia   . Anxiety   . Arthritis   . Bipolar 1 disorder ([place])   . Depression   . GERD (gastroesophageal reflux disease)   . Hypertension   . Lipoma    left shoulder  . Obesity   . Pneumonia   . Polysubstance abuse ([place])   . Schizophrenia [facility])     Patient Active Problem List   Diagnosis Date Noted  . Lipoma of left shoulder 07/24/2016  . Wound infection after surgery 08/11/2014  . Post-operative state 07/31/2014  . Diverticulosis 07/02/2014  . Intramural leiomyoma of uterus 07/02/2014  . Pelvic pain in female 07/02/2014  . Morbid obesity ([place]) 07/02/2014  . Diverticulosis of large intestine without hemorrhage 07/02/2014  . Fibroid uterus, with degeneration 05/25/2014  . Alcohol abuse 10/05/2012  . Cocaine abuse ([place]) 10/05/2012  . Chronic back pain greater than 3 months duration 07/16/2012  . Major depressive disorder, recurrent episode, moderate ([place]) 06/20/2012    Past Surgical History:  Procedure Laterality Date  . CESAREAN SECTION    . DENTAL SURGERY    . LIPOMA EXCISION Left 07/24/2016   Procedure:  EXCISION LIPOMA LEFT SHOULDER;  Surgeon: Greer Pickerel, MD;  Location: [place] [facility];  Service: General;  Laterality: Left;  . SUPRACERVICAL ABDOMINAL HYSTERECTOMY N/A 07/31/2014   Procedure: HYSTERECTOMY SUPRACERVICAL ABDOMINAL;  Surgeon: Emily Filbert, MD;  Location: [facility];  Service: Gynecology;  Laterality: N/A;  Requested 07/31/14 @ 10:00a with Nelwyn Salisbury first assist  . TUBAL LIGATION       OB History    Gravida  5   Para  3   Term  1   Preterm  2   AB  1   Living  2     SAB      TAB      Ectopic      Multiple      Live Births  3        Obstetric Comments  1- SIDS 1- Stillbirth 2 living children        Family History  Problem Relation Age of Onset  . Hypertension Mother   . Osteoarthritis Mother   . Osteoarthritis Father     Social History   Tobacco Use  . Smoking status: Current Every Day Smoker    Packs/day: 0.50    Types: Cigarettes  . Smokeless tobacco: Never Used  Substance Use Topics  . Alcohol use: No  . Drug use: No    Types: Cocaine    Comment:  (denies use 05/12/2017)crack/ sporadic- none since 2014  Home Medications Prior to Admission medications   Medication Sig Start Date End Date Taking? Authorizing Provider  amLODipine (NORVASC) 10 MG tablet Take 1 tablet (10 mg total) by mouth daily after supper. 05/13/17   Dorena Dew, FNP  clindamycin (CLEOCIN) 150 MG capsule Take 2 capsules (300 mg total) by mouth 3 (three) times daily for 7 days. May dispense as 150mg  capsules 03/15/19 03/22/19  Antonietta Breach, PA-C  cyclobenzaprine (FLEXERIL) 10 MG tablet Take 1 tablet (10 mg total) by mouth 3 (three) times daily as needed for muscle spasms. [id number]   Delora Fuel, MD  furosemide (LASIX) 40 MG tablet Take 1 tablet (40 mg total) by mouth daily. [id number]   Delora Fuel, MD  lisinopril-hydrochlorothiazide (PRINZIDE,ZESTORETIC) 20-25 MG tablet Take 1 tablet by mouth daily. 03/11/17   Fransico Meadow, PA-C  oxyCODONE (ROXICODONE) 5 MG immediate release tablet Take 1  tablet (5 mg total) by mouth every 4 (four) hours as needed for severe pain. Patient taking differently: Take 15 mg by mouth every 4 (four) hours as needed for severe pain.  02/07/17   Blue, Olivia C, PA-C  phenol (CHLORASEPTIC) 1.4 % LIQD Use as directed 2 sprays in the mouth or throat as needed for throat irritation / pain. 03/15/19   Antonietta Breach, PA-C  QUEtiapine (SEROQUEL) 50 MG tablet Take 50 mg by mouth at bedtime.    [provider]  dicyclomine (BENTYL) 20 MG tablet Take 1 tablet (20 mg total) by mouth 2 (two) times daily. Patient not taking: Reported on 06/24/2017 09/14/16 11/09/18  Lacretia Leigh, MD  DULoxetine (CYMBALTA) 30 MG capsule Take 1 capsule (30 mg total) by mouth daily. Patient not taking: Reported on 11/09/2018 05/13/17 11/09/18  Dorena Dew, FNP  famotidine (PEPCID) 20 MG tablet Take 1 tablet (20 mg total) by mouth 2 (two) times daily. Patient not taking: Reported on 06/24/2017 09/14/16 11/09/18  Lacretia Leigh, MD  gabapentin (NEURONTIN) 300 MG capsule Take 1 capsule (300 mg total) by mouth 4 (four) times daily. Patient not taking: Reported on 11/09/2018 05/13/17 11/09/18  Dorena Dew, FNP  omeprazole (PRILOSEC) 20 MG capsule Take 1 capsule (20 mg total) by mouth daily. Patient not taking: Reported on 06/24/2017 07/19/16 11/09/18  Okey Regal, PA-C    Allergies    Ceftin  Review of Systems   Review of Systems  HENT: Positive for congestion, postnasal drip and sore throat. Negative for drooling.   Gastrointestinal: Negative for vomiting.  Ten systems reviewed and are negative for acute change, except as noted in the HPI.    Physical Exam Updated Vital Signs BP (!) 151/115 (BP Location: Left Arm)   Pulse 96   Temp 99.5 F (37.5 C) (Oral)   Resp 18   Ht 5\' 5"  (1.651 m)   Wt (!) 177.2 kg   LMP 06/19/2014   SpO2 95%   BMI 65.00 kg/m   Physical Exam Vitals and nursing note reviewed.  Constitutional:      General: She is not in acute distress.    Appearance:  She is well-developed. She is not diaphoretic.     Comments: Morbidly obese AA female.  HENT:     Head: Normocephalic and atraumatic.     Mouth/Throat:     Comments: Normal phonation. Tongue protrusion is normal. Tolerating secretions with discomfort.  She has enlargement of bilateral tonsils with white exudate. Uvula midline.  Eyes:     General: No scleral icterus.    Conjunctiva/sclera: Conjunctivae normal.  Pulmonary:  Effort: Pulmonary effort is normal. No respiratory distress.     Comments: Respirations even and unlabored Musculoskeletal:        General: Normal range of motion.     Cervical back: Normal range of motion.  Skin:    General: Skin is warm and dry.     Coloration: Skin is not pale.     Findings: No erythema or rash.  Neurological:     Mental Status: She is alert and oriented to person, place, and time.  Psychiatric:        Behavior: Behavior normal.     ED Results / Procedures / Treatments   Labs (all labs ordered are listed, but only abnormal results are displayed) Labs Reviewed  GROUP A STREP BY PCR    EKG None  Radiology No results found.  Procedures Procedures (including critical care time)  Medications Ordered in ED Medications  alum & mag hydroxide-simeth (MAALOX/MYLANTA) 200-200-20 MG/5ML suspension 30 mL (30 mLs Oral Given 03/15/19 0449)    And  lidocaine (XYLOCAINE) 2 % viscous mouth solution 15 mL (15 mLs Oral Given 03/15/19 0449)  ketorolac (TORADOL) 30 MG/ML injection 30 mg (30 mg Intramuscular Given 03/15/19 0450)  dexamethasone (DECADRON) injection 10 mg (10 mg Intramuscular Given 03/15/19 0450)  clindamycin (CLEOCIN) capsule 300 mg (300 mg Oral Given 03/15/19 0450)    ED Course  I have reviewed the triage vital signs and the nursing notes.  Pertinent labs & imaging results that were available during my care of the patient were reviewed by me and considered in my medical decision making (see chart for details).    MDM  Rules/Calculators/A&P                       44 year old female presents to the emergency department for evaluation of dysphagia x2 days.  She is noted to have tonsillar enlargement bilaterally with exudates.  Reports low-grade fever prior to arrival.  Uvula midline with normal phonation.  She is tolerating secretions without drooling.  Has been drinking fluids prior to arrival.  Centor score is 4 with 51-53% likelihood of strep.  Despite negative PCR, will start on Clindamycin and have patient follow up with her PCP.  She has been advised to use Motrin and Chloraseptic for pain; is already on chronic oxycodone.  Return precautions discussed and provided. Patient discharged in stable condition with no unaddressed concerns.   Final Clinical Impression(s) / ED Diagnoses Final diagnoses:  Tonsillitis    Rx / DC Orders ED Discharge Orders         Ordered    clindamycin (CLEOCIN) 150 MG capsule  3 times daily,   Status:  Discontinued     03/15/19 0446    phenol (CHLORASEPTIC) 1.4 % LIQD  As needed,   Status:  Discontinued     03/15/19 0446    clindamycin (CLEOCIN) 150 MG capsule  3 times daily     03/15/19 0459    phenol (CHLORASEPTIC) 1.4 % LIQD  As needed     03/15/19 0459           Antonietta Breach, PA-C 03/15/19 0530    Ripley Fraise, MD 03/15/19 [phone]

## 2019-03-15 NOTE — Discharge Instructions (Signed)
Take Clindamycin as prescribed. We recommend this antibiotic be taken with an over-the-counter probiotic. Use 600mg  ibuprofen for management of ongoing pain. You have also been prescribed Chloraseptic for pain control. Warm liquids may be soothing to your throat; such as tea with honey and lemon. Gargle with salt water 3-4 times per day. Follow up with your primary care doctor today.

## 2019-03-15 NOTE — ED Triage Notes (Signed)
Pt complains of a sore throat since yesterday, she states that it feels different then strep, she also says that it hurts to swallow and the right side of her neck is swollen

## 2019-08-27 ENCOUNTER — Encounter (HOSPITAL_COMMUNITY): Payer: Self-pay | Admitting: Emergency Medicine

## 2019-08-27 ENCOUNTER — Emergency Department (HOSPITAL_COMMUNITY)
Admission: EM | Admit: 2019-08-27 | Discharge: 2019-08-28 | Disposition: A | Discharge status: Home / Self Care | Payer: Medicaid Other | Attending: Emergency Medicine | Admitting: Emergency Medicine

## 2019-08-27 ENCOUNTER — Other Ambulatory Visit: Payer: Self-pay

## 2019-08-27 DIAGNOSIS — Z79899 Other long term (current) drug therapy: Secondary | ICD-10-CM | POA: Insufficient documentation

## 2019-08-27 DIAGNOSIS — W19XXXA Unspecified fall, initial encounter: Secondary | ICD-10-CM

## 2019-08-27 DIAGNOSIS — M5441 Lumbago with sciatica, right side: Secondary | ICD-10-CM

## 2019-08-27 DIAGNOSIS — W108XXA Fall (on) (from) other stairs and steps, initial encounter: Secondary | ICD-10-CM | POA: Insufficient documentation

## 2019-08-27 DIAGNOSIS — Y929 Unspecified place or not applicable: Secondary | ICD-10-CM | POA: Insufficient documentation

## 2019-08-27 DIAGNOSIS — M546 Pain in thoracic spine: Secondary | ICD-10-CM

## 2019-08-27 DIAGNOSIS — Y939 Activity, unspecified: Secondary | ICD-10-CM | POA: Insufficient documentation

## 2019-08-27 DIAGNOSIS — Y999 Unspecified external cause status: Secondary | ICD-10-CM | POA: Insufficient documentation

## 2019-08-27 DIAGNOSIS — R42 Dizziness and giddiness: Secondary | ICD-10-CM | POA: Insufficient documentation

## 2019-08-27 DIAGNOSIS — M5431 Sciatica, right side: Secondary | ICD-10-CM | POA: Insufficient documentation

## 2019-08-27 DIAGNOSIS — R519 Headache, unspecified: Secondary | ICD-10-CM | POA: Insufficient documentation

## 2019-08-27 DIAGNOSIS — F17210 Nicotine dependence, cigarettes, uncomplicated: Secondary | ICD-10-CM | POA: Insufficient documentation

## 2019-08-27 DIAGNOSIS — M791 Myalgia, unspecified site: Secondary | ICD-10-CM | POA: Insufficient documentation

## 2019-08-27 MED ORDER — HYDROMORPHONE HCL 1 MG/ML IJ SOLN
1.0000 mg | Freq: Once | INTRAMUSCULAR | Status: AC
  Administered 2019-08-28: 1 mg via INTRAMUSCULAR
  Filled 2019-08-27: qty 1

## 2019-08-27 NOTE — ED Provider Notes (Signed)
[facility] DEPT Provider Note   CSN: [id number] Arrival date & time: 08/27/19  1651     History Chief Complaint  Patient presents with  . Fall    Linda Sheppard is a 44 y.o. female with a history of schizophrenia, depression, bipolar 2 disorder, anxiety, morbid obesity, chronic opioid use, polysubstance use disorder who presents to the emergency department with a chief complaint of fall.  The patient reports that she tripped and fell down 3 concrete stairs approximately 24 hours ago after losing her footing.  She fell onto her buttocks, back and hit the back of her head on the steps.  She denies LOC, nausea, or vomiting.  Reports that she initially had some dizziness and a headache, but this is since resolved.  She required assistance to stand after the fall due to having sudden onset, severe pain in her low back.  Although she is having bilateral low back pain that is worse than baseline, left-sided pain is significantly worse than her right side.  She also reports that she began having some mid back pain, which is worse than baseline, but is now having a sensation that feels like an electric shock radiating bilaterally if around her back in the thoracic spine.  She also reports that she has developed some tingling in her right lower leg.  She has a hematoma noted to her posterior scalp.  No neck pain, weakness  The history is provided by the patient. No language interpreter was used.       Past Medical History:  Diagnosis Date  . Anemia   . Anxiety   . Arthritis   . Bipolar 1 disorder ([place])   . Depression   . GERD (gastroesophageal reflux disease)   . Hypertension   . Lipoma    left shoulder  . Obesity   . Pneumonia   . Polysubstance abuse ([place])   . Schizophrenia [facility])     Patient Active Problem List   Diagnosis Date Noted  . Lipoma of left shoulder 07/24/2016  . Wound infection after surgery 08/11/2014  . Post-operative state  07/31/2014  . Diverticulosis 07/02/2014  . Intramural leiomyoma of uterus 07/02/2014  . Pelvic pain in female 07/02/2014  . Morbid obesity ([place]) 07/02/2014  . Diverticulosis of large intestine without hemorrhage 07/02/2014  . Fibroid uterus, with degeneration 05/25/2014  . Alcohol abuse 10/05/2012  . Cocaine abuse ([place]) 10/05/2012  . Chronic back pain greater than 3 months duration 07/16/2012  . Major depressive disorder, recurrent episode, moderate ([place]) 06/20/2012    Past Surgical History:  Procedure Laterality Date  . CESAREAN SECTION    . DENTAL SURGERY    . LIPOMA EXCISION Left 07/24/2016   Procedure: EXCISION LIPOMA LEFT SHOULDER;  Surgeon: Greer Pickerel, MD;  Location: [facility];  Service: General;  Laterality: Left;  . SUPRACERVICAL ABDOMINAL HYSTERECTOMY N/A 07/31/2014   Procedure: HYSTERECTOMY SUPRACERVICAL ABDOMINAL;  Surgeon: Emily Filbert, MD;  Location: [facility];  Service: Gynecology;  Laterality: N/A;  Requested 07/31/14 @ 10:00a with Nelwyn Salisbury first assist  . TUBAL LIGATION       OB History    Gravida  5   Para  3   Term  1   Preterm  2   AB  1   Living  2     SAB      TAB      Ectopic      Multiple      Live Births  3  Obstetric Comments  1- SIDS 1- Stillbirth 2 living children        Family History  Problem Relation Age of Onset  . Hypertension Mother   . Osteoarthritis Mother   . Osteoarthritis Father     Social History   Tobacco Use  . Smoking status: Current Every Day Smoker    Packs/day: 0.50    Types: Cigarettes  . Smokeless tobacco: Never Used  Vaping Use  . Vaping Use: Never used  Substance Use Topics  . Alcohol use: No  . Drug use: No    Types: Cocaine    Comment:  (denies use 05/12/2017)crack/ sporadic- none since 2014    Home Medications Prior to Admission medications   Medication Sig Start Date End Date Taking? Authorizing Provider  amLODipine (NORVASC) 10 MG tablet Take 1 tablet (10 mg total) by mouth daily after  supper. 05/13/17   Dorena Dew, FNP  cyclobenzaprine (FLEXERIL) 10 MG tablet Take 1 tablet (10 mg total) by mouth 3 (three) times daily as needed for muscle spasms. 09/09/39   Delora Fuel, MD  furosemide (LASIX) 40 MG tablet Take 1 tablet (40 mg total) by mouth daily. 11/09/77   Delora Fuel, MD  lisinopril-hydrochlorothiazide (PRINZIDE,ZESTORETIC) 20-25 MG tablet Take 1 tablet by mouth daily. 03/11/17   Fransico Meadow, PA-C  oxyCODONE (ROXICODONE) 5 MG immediate release tablet Take 1 tablet (5 mg total) by mouth every 4 (four) hours as needed for severe pain. Patient taking differently: Take 15 mg by mouth every 4 (four) hours as needed for severe pain.  02/07/17   Blue, Olivia C, PA-C  phenol (CHLORASEPTIC) 1.4 % LIQD Use as directed 2 sprays in the mouth or throat as needed for throat irritation / pain. 03/15/19   Antonietta Breach, PA-C  predniSONE (STERAPRED UNI-PAK 21 TAB) 10 MG (21) TBPK tablet Take by mouth daily. Take 6 tabs by mouth daily  for 2 days, then 5 tabs for 2 days, then 4 tabs for 2 days, then 3 tabs for 2 days, 2 tabs for 2 days, then 1 tab by mouth daily for 2 days 08/28/19   Abrar Koone A, PA-C  QUEtiapine (SEROQUEL) 50 MG tablet Take 50 mg by mouth at bedtime.    [provider]  dicyclomine (BENTYL) 20 MG tablet Take 1 tablet (20 mg total) by mouth 2 (two) times daily. Patient not taking: Reported on 06/24/2017 09/14/16 11/09/18  Lacretia Leigh, MD  DULoxetine (CYMBALTA) 30 MG capsule Take 1 capsule (30 mg total) by mouth daily. Patient not taking: Reported on 11/09/2018 05/13/17 11/09/18  Dorena Dew, FNP  famotidine (PEPCID) 20 MG tablet Take 1 tablet (20 mg total) by mouth 2 (two) times daily. Patient not taking: Reported on 06/24/2017 09/14/16 11/09/18  Lacretia Leigh, MD  gabapentin (NEURONTIN) 300 MG capsule Take 1 capsule (300 mg total) by mouth 4 (four) times daily. Patient not taking: Reported on 11/09/2018 05/13/17 11/09/18  Dorena Dew, FNP  omeprazole (PRILOSEC) 20 MG  capsule Take 1 capsule (20 mg total) by mouth daily. Patient not taking: Reported on 06/24/2017 07/19/16 11/09/18  Okey Regal, PA-C    Allergies    Ceftin  Review of Systems   Review of Systems  Constitutional: Negative for activity change, chills and fever.  Eyes: Negative for visual disturbance.  Respiratory: Negative for shortness of breath.   Cardiovascular: Negative for chest pain.  Gastrointestinal: Negative for abdominal pain, diarrhea, nausea and vomiting.  Genitourinary: Negative for dysuria and flank pain.  Musculoskeletal: Positive for back pain and myalgias. Negative for neck pain and neck stiffness.  Skin: Negative for rash.  Allergic/Immunologic: Negative for immunocompromised state.  Neurological: Positive for headaches. Negative for dizziness, seizures, syncope, weakness and numbness.       Paresthesias  Psychiatric/Behavioral: Negative for confusion.    Physical Exam Updated Vital Signs BP 114/77   Pulse 91   Temp 98.5 F (36.9 C) (Oral)   Resp 15   Ht 5' 6.5" (1.689 m)   Wt (!) 163.3 kg   LMP 06/19/2014   SpO2 98%   BMI 57.24 kg/m   Physical Exam Vitals and nursing note reviewed.  Constitutional:      General: She is not in acute distress.    Appearance: She is obese.  HENT:     Head: Normocephalic. No raccoon eyes or Battle's sign.     Comments: Small hematoma noted to the mid occipital scalp no skull abnormalities or lacerations.    Mouth/Throat:     Mouth: Mucous membranes are moist.  Eyes:     Extraocular Movements: Extraocular movements intact.     Conjunctiva/sclera: Conjunctivae normal.     Pupils: Pupils are equal, round, and reactive to light.  Cardiovascular:     Rate and Rhythm: Normal rate and regular rhythm.     Heart sounds: No murmur heard.  No friction rub. No gallop.   Pulmonary:     Effort: Pulmonary effort is normal. No respiratory distress.     Breath sounds: No stridor. No wheezing, rhonchi or rales.     Comments: Lung  sounds are clear and equal in all fields Chest:     Chest wall: No tenderness.  Abdominal:     General: There is no distension.     Palpations: Abdomen is soft. There is no mass.     Tenderness: There is no abdominal tenderness. There is no right CVA tenderness, left CVA tenderness, guarding or rebound.     Hernia: No hernia is present.  Musculoskeletal:     Cervical back: Neck supple.     Right lower leg: No edema.     Left lower leg: No edema.     Comments: WithNo tenderness to palpation to the spinous processes of the cervical spine.  There is diffuse tenderness palpation to the spinous processes of the thoracic and lumbar spine, especially over L5.  No crepitus or step-offs.  Exam of the pelvis is technically difficult secondary to body habitus.  However, pelvis appears stable.  Normal exam of the joints of the bilateral upper and lower extremities.  She is has 2+ peripheral pulses throughout.  Decreased sensation to the right lower extremity to be medial portion of the dorsum of the right foot and circumferentially to the distal lower leg.  Sensation is intact throughout the left lower extremity.  No other neurologic deficits.  Patient is able to stand and bear weight on the bilateral lower extremities. Ambulates with antalgic gait.   Skin:    General: Skin is warm.     Coloration: Skin is not jaundiced.     Findings: No rash.  Neurological:     Mental Status: She is alert and oriented to person, place, and time.     Comments:   Cranial nerves II through XII are grossly intact.  Psychiatric:        Behavior: Behavior normal.     ED Results / Procedures / Treatments   Labs (all labs ordered are listed, but only abnormal  results are displayed) Labs Reviewed - No data to display  EKG None  Radiology CT Head Wo Contrast  Result Date: 08/28/2019 CLINICAL DATA:  Tripped and fall EXAM: CT HEAD WITHOUT CONTRAST TECHNIQUE: Contiguous axial images were obtained from the base of the  skull through the vertex without intravenous contrast. COMPARISON:  June 11, 2013 FINDINGS: Brain: No evidence of acute territorial infarction, hemorrhage, hydrocephalus,extra-axial collection or mass lesion/mass effect. Normal gray-white differentiation. Ventricles are normal in size and contour. Vascular: No hyperdense vessel or unexpected calcification. Skull: The skull is intact. No fracture or focal lesion identified. Sinuses/Orbits: The visualized paranasal sinuses and mastoid air cells are clear. The orbits and globes intact. Other: None IMPRESSION: No acute intracranial abnormality. Electronically Signed   By: Prudencio Pair M.D.   On: 08/28/2019 00:40   CT Thoracic Spine Wo Contrast  Result Date: 08/28/2019 CLINICAL DATA:  Initial evaluation for acute trauma, fall. EXAM: CT THORACIC SPINE WITHOUT CONTRAST TECHNIQUE: Multidetector CT images of the thoracic were obtained using the standard protocol without intravenous contrast. COMPARISON:  None available. FINDINGS: Alignment: Vertebral bodies normally aligned with preservation of the normal thoracic kyphosis. No listhesis or static subluxation. Vertebrae: Vertebral body height well maintained without evidence for acute or chronic fracture. No discrete or worrisome osseous lesions. Visualized ribs intact. Paraspinal and other soft tissues: No acute paraspinous soft tissue abnormality. Partially visualized lungs are clear. Disc levels: Degenerative disc desiccation noted at T10-11. No other significant disc pathology. No appreciable canal or foraminal stenosis. IMPRESSION: No acute traumatic injury within the thoracic spine. Electronically Signed   By: Jeannine Boga M.D.   On: 08/28/2019 01:32   CT Lumbar Spine Wo Contrast  Result Date: 08/28/2019 CLINICAL DATA:  Initial evaluation for acute trauma, fall. EXAM: CT LUMBAR SPINE WITHOUT CONTRAST TECHNIQUE: Multidetector CT imaging of the lumbar spine was performed without intravenous contrast  administration. Multiplanar CT image reconstructions were also generated. COMPARISON:  None available. FINDINGS: Segmentation: Standard. Lowest well-formed disc space labeled the L5-S1 level. Alignment: Physiologic with preservation of the normal lumbar lordosis. No listhesis. Vertebrae: Vertebral body height maintained without evidence for acute or chronic fracture. Visualized sacrum and pelvis grossly intact. No discrete lytic or blastic osseous lesions. Paraspinal and other soft tissues: Paraspinous soft tissues demonstrate no acute finding. Disc levels: Degenerative disc desiccation with mild disc bulge noted at L5-S1 without significant stenosis. Mild to moderate bilateral facet hypertrophy noted at L4-5 and L5-S1. No significant spinal stenosis identified. IMPRESSION: No acute traumatic injury within the lumbar spine. Electronically Signed   By: Jeannine Boga M.D.   On: 08/28/2019 02:07    Procedures Procedures (including critical care time)  Medications Ordered in ED Medications  HYDROmorphone (DILAUDID) injection 1 mg (1 mg Intramuscular Given 08/28/19 0018)    ED Course  I have reviewed the triage vital signs and the nursing notes.  Pertinent labs & imaging results that were available during my care of the patient were reviewed by me and considered in my medical decision making (see chart for details).    MDM Rules/Calculators/A&P                          44 year old female with a history of schizophrenia, depression, bipolar 2 disorder, anxiety, morbid obesity, chronic opioid use, polysubstance use disorder presenting with posttraumatic headache and back pain after she tripped and fell backwards down 3 concrete stairs approximately 24 hours ago.   She is endorsing new paresthesias to  the thoracic spine and in the right lower extremity.  She does have some subjective numbness noted to the right distal lower leg and medial aspect of the dorsum of the right foot, but otherwise  sensation and motor function are intact.  No saddle paresthesias or incontinence.  There is some tenderness to the thoracic and lumbar spine.  Given body habitus of the patient, x-ray was deferred and CT thoracic and lumbar spine were ordered as well as CT head given that she had head trauma.  CT head is unremarkable.  CT imaging of the back with mild disc bulge at L5-S1 without significant stenosis and there is also mild to moderate bilateral facet hypertrophy noted to the lumbar spine.  She did have degenerative disc disease at T10 and 11, around where she was having paresthesias in the thoracic spine.  However, there is no acute traumatic injury noted.  She was given Dilaudid in the ER, on reevaluation, she is markedly improved.  She is currently prescribed home narcotics.  She was advised to continue her home medications and I will discharge her with a course of prednisone and a referral to neurosurgery as she may require nonemergent outpatient MRI.  She was also advised to continue conservative pain management with Tylenol, ibuprofen, and lidocaine patches.  She will also be given a referral to [facility] health and wellness to help establish medical insurance.  The patient was discussed with Dr. Florina Ou, attending physician.  At this time, she is hemodynamically stable and in no acute distress.  Safe for discharge to home with strict ER return precautions and outpatient follow-up as indicated.  Final Clinical Impression(s) / ED Diagnoses Final diagnoses:  Fall, initial encounter  Acute bilateral thoracic back pain  Acute bilateral low back pain with right-sided sciatica    Rx / DC Orders ED Discharge Orders         Ordered    predniSONE (STERAPRED UNI-PAK 21 TAB) 10 MG (21) TBPK tablet  Daily     Discontinue  Reprint     08/28/19 0233           Joline Maxcy A, PA-C 08/28/19 1022    Molpus, John, MD 08/28/19 2235

## 2019-08-27 NOTE — ED Triage Notes (Signed)
Patient reports trip and fall down three stairs today. States hit head on concrete stair. C/o lower back pain. Denies LOC and taking blood thinner. Denies neck pain. Hx herniated disc.

## 2019-08-28 ENCOUNTER — Emergency Department (HOSPITAL_COMMUNITY): Payer: Medicaid Other

## 2019-08-28 MED ORDER — PREDNISONE 10 MG (21) PO TBPK: ORAL_TABLET | Freq: Every day | ORAL | 0 refills | Status: DC

## 2019-08-28 NOTE — Discharge Instructions (Signed)
Thank you for allowing me to care for you today in the Emergency Department.   Take 650 mg of Tylenol or 600 mg of ibuprofen with food every 6 hours for pain.  You can alternate between these 2 medications every 3 hours if your pain returns.  For instance, you can take Tylenol at noon, followed by a dose of ibuprofen at 3, followed by second dose of Tylenol and 6.  Apply an ice pack for 15 to 20 minutes up to 3-4 times a day for the next 5 days.  Use the stretch as above to stretch as your pain allows.  Starting tomorrow, take prednisone as prescribed.  Continue to take your home pain medication as prescribed.  You can follow-up with [facility] and wellness for assistance with some different insurance options at their financial assistance clinic.  I have provided a courtesy referral to the [facility] center.  Call [facility] neurosurgery to follow-up regarding your back pain if it does not significantly improve with the regimen above in 1 week.  Return to the Emergency Department if you become unable to walk, if you have episodes where you start pooping on yourself or completely lose control of your bladder and urinate on yourself, if you develop new weakness, significantly worsening numbness, if you develop numbness in your groin, or other new, concerning symptoms.

## 2019-09-06 ENCOUNTER — Other Ambulatory Visit: Payer: Self-pay

## 2019-09-06 ENCOUNTER — Inpatient Hospital Stay (HOSPITAL_COMMUNITY): Payer: Self-pay

## 2019-09-06 ENCOUNTER — Encounter (HOSPITAL_COMMUNITY): Payer: Self-pay | Admitting: Emergency Medicine

## 2019-09-06 ENCOUNTER — Inpatient Hospital Stay (HOSPITAL_COMMUNITY)
Admission: EM | Admit: 2019-09-06 | Discharge: 2019-09-07 | DRG: 918 | Disposition: A | Discharge status: Home / Self Care | Payer: Self-pay | Attending: Pulmonary Disease | Admitting: Pulmonary Disease

## 2019-09-06 DIAGNOSIS — I10 Essential (primary) hypertension: Secondary | ICD-10-CM | POA: Diagnosis present

## 2019-09-06 DIAGNOSIS — T40601A Poisoning by unspecified narcotics, accidental (unintentional), initial encounter: Principal | ICD-10-CM | POA: Diagnosis present

## 2019-09-06 DIAGNOSIS — F17210 Nicotine dependence, cigarettes, uncomplicated: Secondary | ICD-10-CM | POA: Diagnosis present

## 2019-09-06 DIAGNOSIS — Z79899 Other long term (current) drug therapy: Secondary | ICD-10-CM

## 2019-09-06 DIAGNOSIS — F209 Schizophrenia, unspecified: Secondary | ICD-10-CM | POA: Diagnosis present

## 2019-09-06 DIAGNOSIS — Z8249 Family history of ischemic heart disease and other diseases of the circulatory system: Secondary | ICD-10-CM

## 2019-09-06 DIAGNOSIS — N179 Acute kidney failure, unspecified: Secondary | ICD-10-CM | POA: Diagnosis present

## 2019-09-06 DIAGNOSIS — F319 Bipolar disorder, unspecified: Secondary | ICD-10-CM | POA: Diagnosis present

## 2019-09-06 DIAGNOSIS — E861 Hypovolemia: Secondary | ICD-10-CM | POA: Diagnosis present

## 2019-09-06 DIAGNOSIS — Z20822 Contact with and (suspected) exposure to covid-19: Secondary | ICD-10-CM | POA: Diagnosis present

## 2019-09-06 DIAGNOSIS — F1410 Cocaine abuse, uncomplicated: Secondary | ICD-10-CM | POA: Diagnosis present

## 2019-09-06 DIAGNOSIS — Z9851 Tubal ligation status: Secondary | ICD-10-CM

## 2019-09-06 DIAGNOSIS — F419 Anxiety disorder, unspecified: Secondary | ICD-10-CM | POA: Diagnosis present

## 2019-09-06 DIAGNOSIS — Z6841 Body Mass Index (BMI) 40.0 and over, adult: Secondary | ICD-10-CM

## 2019-09-06 DIAGNOSIS — Z888 Allergy status to other drugs, medicaments and biological substances status: Secondary | ICD-10-CM

## 2019-09-06 DIAGNOSIS — F1990 Other psychoactive substance use, unspecified, uncomplicated: Secondary | ICD-10-CM

## 2019-09-06 DIAGNOSIS — M1990 Unspecified osteoarthritis, unspecified site: Secondary | ICD-10-CM | POA: Diagnosis present

## 2019-09-06 DIAGNOSIS — K219 Gastro-esophageal reflux disease without esophagitis: Secondary | ICD-10-CM | POA: Diagnosis present

## 2019-09-06 LAB — URINALYSIS, ROUTINE W REFLEX MICROSCOPIC
Bilirubin Urine: NEGATIVE
Glucose, UA: NEGATIVE mg/dL
Hgb urine dipstick: NEGATIVE
Ketones, ur: NEGATIVE mg/dL
Leukocytes,Ua: NEGATIVE
Nitrite: NEGATIVE
Protein, ur: NEGATIVE mg/dL
Specific Gravity, Urine: 1.014 (ref 1.005–1.030)
pH: 5 (ref 5.0–8.0)

## 2019-09-06 LAB — COMPREHENSIVE METABOLIC PANEL
ALT: 25 U/L (ref 0–44)
AST: 69 U/L — ABNORMAL HIGH (ref 15–41)
Albumin: 4.2 g/dL (ref 3.5–5.0)
Alkaline Phosphatase: 74 U/L (ref 38–126)
Anion gap: 12 (ref 5–15)
BUN: 35 mg/dL — ABNORMAL HIGH (ref 6–20)
CO2: 24 mmol/L (ref 22–32)
Calcium: 8.6 mg/dL — ABNORMAL LOW (ref 8.9–10.3)
Chloride: 97 mmol/L — ABNORMAL LOW (ref 98–111)
Creatinine, Ser: 4.26 mg/dL — ABNORMAL HIGH (ref 0.44–1.00)
GFR calc Af Amer: 14 mL/min — ABNORMAL LOW (ref >60)
GFR calc non Af Amer: 12 mL/min — ABNORMAL LOW (ref >60)
Glucose, Bld: 89 mg/dL (ref 70–99)
Potassium: 4.9 mmol/L (ref 3.5–5.1)
Sodium: 133 mmol/L — ABNORMAL LOW (ref 135–145)
Total Bilirubin: 1.6 mg/dL — ABNORMAL HIGH (ref 0.3–1.2)
Total Protein: 8.4 g/dL — ABNORMAL HIGH (ref 6.5–8.1)

## 2019-09-06 LAB — CBC WITH DIFFERENTIAL/PLATELET
Abs Immature Granulocytes: 0.09 10*3/uL — ABNORMAL HIGH (ref 0.00–0.07)
Basophils Absolute: 0 10*3/uL (ref 0.0–0.1)
Basophils Relative: 0 %
Eosinophils Absolute: 0.1 10*3/uL (ref 0.0–0.5)
Eosinophils Relative: 1 %
HCT: 37.6 % (ref 36.0–46.0)
Hemoglobin: 12.7 g/dL (ref 12.0–15.0)
Immature Granulocytes: 1 %
Lymphocytes Relative: 14 %
Lymphs Abs: 1.4 10*3/uL (ref 0.7–4.0)
MCH: 27 pg (ref 26.0–34.0)
MCHC: 33.8 g/dL (ref 30.0–36.0)
MCV: 80 fL (ref 80.0–100.0)
Monocytes Absolute: 1.1 10*3/uL — ABNORMAL HIGH (ref 0.1–1.0)
Monocytes Relative: 11 %
Neutro Abs: 7.2 10*3/uL (ref 1.7–7.7)
Neutrophils Relative %: 73 %
Platelets: 261 10*3/uL (ref 150–400)
RBC: 4.7 MIL/uL (ref 3.87–5.11)
RDW: 13.7 % (ref 11.5–15.5)
WBC: 9.9 10*3/uL (ref 4.0–10.5)
nRBC: 0 % (ref 0.0–0.2)

## 2019-09-06 LAB — COOXEMETRY PANEL
Carboxyhemoglobin: 1.7 % — ABNORMAL HIGH (ref 0.5–1.5)
Methemoglobin: 1.2 % (ref 0.0–1.5)
O2 Saturation: 56.6 %
Total hemoglobin: 12.2 g/dL (ref 12.0–16.0)

## 2019-09-06 LAB — CREATININE, URINE, RANDOM: Creatinine, Urine: 243.73 mg/dL

## 2019-09-06 LAB — SALICYLATE LEVEL: Salicylate Lvl: <7 mg/dL — ABNORMAL LOW (ref 7.0–30.0)

## 2019-09-06 LAB — RAPID URINE DRUG SCREEN, HOSP PERFORMED
Amphetamines: NOT DETECTED
Barbiturates: NOT DETECTED
Benzodiazepines: POSITIVE — AB
Cocaine: POSITIVE — AB
Opiates: POSITIVE — AB
Tetrahydrocannabinol: NOT DETECTED

## 2019-09-06 LAB — ACETAMINOPHEN LEVEL: Acetaminophen (Tylenol), Serum: <10 ug/mL — ABNORMAL LOW (ref 10–30)

## 2019-09-06 LAB — CK: Total CK: 689 U/L — ABNORMAL HIGH (ref 38–234)

## 2019-09-06 LAB — SARS CORONAVIRUS 2 BY RT PCR (HOSPITAL ORDER, PERFORMED IN ~~LOC~~ HOSPITAL LAB): SARS Coronavirus 2: NEGATIVE

## 2019-09-06 LAB — TROPONIN I (HIGH SENSITIVITY)
Troponin I (High Sensitivity): 6 ng/L (ref <18)
Troponin I (High Sensitivity): 5 ng/L (ref <18)

## 2019-09-06 LAB — ETHANOL: Alcohol, Ethyl (B): <10 mg/dL (ref <10)

## 2019-09-06 LAB — SODIUM, URINE, RANDOM: Sodium, Ur: 55 mmol/L

## 2019-09-06 MED ORDER — SODIUM CHLORIDE 0.9 % IV BOLUS
1000.0000 mL | Freq: Once | INTRAVENOUS | Status: AC
  Administered 2019-09-06: 1000 mL via INTRAVENOUS

## 2019-09-06 MED ORDER — NALOXONE HCL 0.4 MG/ML IJ SOLN
0.4000 mg | Freq: Once | INTRAMUSCULAR | Status: AC
  Administered 2019-09-06: 0.4 mg via INTRAVENOUS
  Filled 2019-09-06: qty 1

## 2019-09-06 MED ORDER — NALOXONE HCL 4 MG/0.1ML NA LIQD
1.0000 | Freq: Once | NASAL | Status: AC
  Administered 2019-09-06: 1 via NASAL
  Filled 2019-09-06: qty 4

## 2019-09-06 MED ORDER — NALOXONE HCL 4 MG/10ML IJ SOLN
1.0000 mg/h | INTRAVENOUS | Status: DC
  Administered 2019-09-06 – 2019-09-07 (×2): 1 mg/h via INTRAVENOUS
  Filled 2019-09-06 (×3): qty 10

## 2019-09-06 NOTE — H&P (Signed)
NAME:  Linda Sheppard, MRN:  [id number], DOB:  Jan 16, 1976, LOS: 0 ADMISSION DATE:  09/06/2019, CONSULTATION DATE:  6/30 REFERRING MD:  Rogene Houston, CHIEF COMPLAINT:  Found down   Brief History   44 y/o female with multiple medical problems including narcotic use was found in her car today unresponsive.  She received narcan in the ED, briefly woke up but later became encephalopathic and required a narcan infusion.  History of present illness   PCCM asked to admit.  She is obtunded due to narcotic overdose and could not provide history.  See brief history above. History was obtained by the patient's caregivers.  She was found in her car in a retail parking lot today, had pinpoint pupils.  She was given narcan and briefly woke up but then fell asleep again.  She was started on a narcan infusion and PCCM was consulted for admission.  Past Medical History  Bipolar disorder Depression GERD Hypertension Schizophrenia Polysubstance abuse  Significant Hospital Events   6/30 admission  Consults:    Procedures:    Significant Diagnostic Tests:  6/30 CT head pending>   Micro Data:  6/30 SARS COV 2 >    Antimicrobials:  none  Interim history/subjective:  As above  Objective   Blood pressure (!) 113/98, pulse (!) 101, temperature 99.2 F (37.3 C), temperature source Oral, resp. rate (!) 28, height 5' 6.5" (1.689 m), weight (!) 163.3 kg, last menstrual period 06/19/2014, SpO2 98 %.       No intake or output data in the 24 hours ending 09/06/19 1421 Filed Weights   09/06/19 1151  Weight: (!) 163.3 kg    Examination:  General:  Asleep in bed HENT: NCAT OP clear, multiple facial piercings PULM: CTA B, normal effort CV: RRR, no mgr GI: BS+, soft, nontender MSK: normal bulk and tone Neuro: asleep, will wake to voice and answer questions somewhat incoherently, doesn't follow commands   Resolved Hospital Problem list     Assessment & Plan:  Acute metabolic encephalopathy  most likely due to narcotic overdose based on clinical history, physical exam and response to narcan Admit to ICU Monitor mental status, monitor for aspiration risk Continue narcan infusion, titrate for RR < 10, titrate off as more awake Tele Hold home narcotics  AKI: likely due to hypovolemia due to poor po intake in setting of encephalopathy Check urinalysis Check urine Cr Check urine Na IV fluids  Hypertension:  Hold home antihypertensives for now  Schizophrenia, depression:  Continue home seroquel, cymbalta  Best practice:  Diet: npo Pain/Anxiety/Delirium protocol (if indicated): n/a VAP protocol (if indicated): n/a DVT prophylaxis: sub q heparin GI prophylaxis: n/a Glucose control: monitor Mobility: bed rest Code Status: full Family Communication: tried to cal her father, no answer, could not leave message Disposition: remain in ICU  Labs   CBC: Recent Labs  Lab 09/06/19 1157  WBC 9.9  NEUTROABS 7.2  HGB 12.7  HCT 37.6  MCV 80.0  PLT 938    Basic Metabolic Panel: Recent Labs  Lab 09/06/19 1157  NA 133*  K 4.9  CL 97*  CO2 24  GLUCOSE 89  BUN 35*  CREATININE 4.26*  CALCIUM 8.6*   GFR: Estimated Creatinine Clearance: 27.6 mL/min (A) (by C-G formula based on SCr of 4.26 mg/dL (H)). Recent Labs  Lab 09/06/19 1157  WBC 9.9    Liver Function Tests: Recent Labs  Lab 09/06/19 1157  AST 69*  ALT 25  ALKPHOS 74  BILITOT 1.6*  PROT 8.4*  ALBUMIN 4.2   No results for input(s): LIPASE, AMYLASE in the last 168 hours. No results for input(s): AMMONIA in the last 168 hours.  ABG    Component Value Date/Time   TCO2 24 08/24/2013 1100     Coagulation Profile: No results for input(s): INR, PROTIME in the last 168 hours.  Cardiac Enzymes: No results for input(s): CKTOTAL, CKMB, CKMBINDEX, TROPONINI in the last 168 hours.  HbA1C: Hemoglobin A1C  Date/Time Value Ref Range Status  05/13/2017 10:40 AM 5.6  Final    CBG: No results for  input(s): GLUCAP in the last 168 hours.  Review of Systems:   Cannot obtain due to change in mental status  Past Medical History  She,  has a past medical history of Anemia, Anxiety, Arthritis, Bipolar 1 disorder ([place]), Depression, GERD (gastroesophageal reflux disease), Hypertension, Lipoma, Obesity, Pneumonia, Polysubstance abuse ([place]), and Schizophrenia ([place]).   Surgical History    Past Surgical History:  Procedure Laterality Date  . CESAREAN SECTION    . DENTAL SURGERY    . LIPOMA EXCISION Left 07/24/2016   Procedure: EXCISION LIPOMA LEFT SHOULDER;  Surgeon: Greer Pickerel, MD;  Location: [facility];  Service: General;  Laterality: Left;  . SUPRACERVICAL ABDOMINAL HYSTERECTOMY N/A 07/31/2014   Procedure: HYSTERECTOMY SUPRACERVICAL ABDOMINAL;  Surgeon: Emily Filbert, MD;  Location: [facility];  Service: Gynecology;  Laterality: N/A;  Requested 07/31/14 @ 10:00a with Nelwyn Salisbury first assist  . TUBAL LIGATION       Social History   reports that she has been smoking cigarettes. She has been smoking about 0.50 packs per day. She has never used smokeless tobacco. She reports that she does not drink alcohol and does not use drugs.   Family History   Her family history includes Hypertension in her mother; Osteoarthritis in her father and mother.   Allergies Allergies  Allergen Reactions  . Ceftin Anaphylaxis     Home Medications  Prior to Admission medications   Medication Sig Start Date End Date Taking? Authorizing Provider  amLODipine (NORVASC) 10 MG tablet Take 1 tablet (10 mg total) by mouth daily after supper. 05/13/17   Dorena Dew, FNP  cyclobenzaprine (FLEXERIL) 10 MG tablet Take 1 tablet (10 mg total) by mouth 3 (three) times daily as needed for muscle spasms. 08/15/65   Delora Fuel, MD  DULoxetine (CYMBALTA) 60 MG capsule Take 60 mg by mouth daily. 05/11/19   [provider]  furosemide (LASIX) 40 MG tablet Take 1 tablet (40 mg total) by mouth daily. 10/15/36   Delora Fuel, MD    gabapentin (NEURONTIN) 300 MG capsule Take 300 mg by mouth in the morning and at bedtime. 09/04/19   [provider]  lisinopril-hydrochlorothiazide (PRINZIDE,ZESTORETIC) 20-25 MG tablet Take 1 tablet by mouth daily. 03/11/17   Fransico Meadow, PA-C  oxyCODONE (ROXICODONE) 15 MG immediate release tablet Take 15 mg by mouth 4 (four) times daily as needed for pain. 08/31/19   [provider]  phenol (CHLORASEPTIC) 1.4 % LIQD Use as directed 2 sprays in the mouth or throat as needed for throat irritation / pain. 03/15/19   Antonietta Breach, PA-C  predniSONE (STERAPRED UNI-PAK 21 TAB) 10 MG (21) TBPK tablet Take by mouth daily. Take 6 tabs by mouth daily  for 2 days, then 5 tabs for 2 days, then 4 tabs for 2 days, then 3 tabs for 2 days, 2 tabs for 2 days, then 1 tab by mouth daily for 2 days 08/28/19  McDonald, Mia A, PA-C  QUEtiapine (SEROQUEL) 100 MG tablet Take 100 mg by mouth at bedtime. 05/15/19   [provider]  dicyclomine (BENTYL) 20 MG tablet Take 1 tablet (20 mg total) by mouth 2 (two) times daily. Patient not taking: Reported on 06/24/2017 09/14/16 11/09/18  Lacretia Leigh, MD  famotidine (PEPCID) 20 MG tablet Take 1 tablet (20 mg total) by mouth 2 (two) times daily. Patient not taking: Reported on 06/24/2017 09/14/16 11/09/18  Lacretia Leigh, MD  omeprazole (PRILOSEC) 20 MG capsule Take 1 capsule (20 mg total) by mouth daily. Patient not taking: Reported on 06/24/2017 07/19/16 11/09/18  Okey Regal, PA-C     Critical care time: 35 minutes     Roselie Awkward, MD [facility] Pager: [phone] Cell: [phone] If no response, call [phone]

## 2019-09-06 NOTE — ED Provider Notes (Signed)
[facility] DEPT Provider Note   CSN: [id number] Arrival date & time: 09/06/19  1129     History Chief Complaint  Patient presents with  . Drug Overdose  . Arm Pain    Linda Sheppard is a 44 y.o. female.  The history is provided by the EMS personnel and the police. No language interpreter was used.     44 year old female with hx of polysubstance abuse, bipolar, anxiety, schizophrenia brought here via EMS for evaluation of AMS.  Pt was found by an off duty paramedic unconscious in her car at [place].  The car was on and pt has pin point pupils with shallow respiratory rate.  Pt was given 4mg  of Narcan and became more responsive and agitated upon entering the ambulance.  Pt then became somnolence a minute prior to arrival.  EMS informed by the patient that she snorted her prescribed oxycodone and admits to smoking crack cocaine this AM.  Also report L arm stabbing bicep pain.  Level V caveats due to AMS.    Past Medical History:  Diagnosis Date  . Anemia   . Anxiety   . Arthritis   . Bipolar 1 disorder ([place])   . Depression   . GERD (gastroesophageal reflux disease)   . Hypertension   . Lipoma    left shoulder  . Obesity   . Pneumonia   . Polysubstance abuse ([place])   . Schizophrenia [facility])     Patient Active Problem List   Diagnosis Date Noted  . Lipoma of left shoulder 07/24/2016  . Wound infection after surgery 08/11/2014  . Post-operative state 07/31/2014  . Diverticulosis 07/02/2014  . Intramural leiomyoma of uterus 07/02/2014  . Pelvic pain in female 07/02/2014  . Morbid obesity ([place]) 07/02/2014  . Diverticulosis of large intestine without hemorrhage 07/02/2014  . Fibroid uterus, with degeneration 05/25/2014  . Alcohol abuse 10/05/2012  . Cocaine abuse ([place]) 10/05/2012  . Chronic back pain greater than 3 months duration 07/16/2012  . Major depressive disorder, recurrent episode, moderate ([place]) 06/20/2012    Past Surgical  History:  Procedure Laterality Date  . CESAREAN SECTION    . DENTAL SURGERY    . LIPOMA EXCISION Left 07/24/2016   Procedure: EXCISION LIPOMA LEFT SHOULDER;  Surgeon: Greer Pickerel, MD;  Location: [facility];  Service: General;  Laterality: Left;  . SUPRACERVICAL ABDOMINAL HYSTERECTOMY N/A 07/31/2014   Procedure: HYSTERECTOMY SUPRACERVICAL ABDOMINAL;  Surgeon: Emily Filbert, MD;  Location: [facility];  Service: Gynecology;  Laterality: N/A;  Requested 07/31/14 @ 10:00a with Nelwyn Salisbury first assist  . TUBAL LIGATION       OB History    Gravida  5   Para  3   Term  1   Preterm  2   AB  1   Living  2     SAB      TAB      Ectopic      Multiple      Live Births  3        Obstetric Comments  1- SIDS 1- Stillbirth 2 living children        Family History  Problem Relation Age of Onset  . Hypertension Mother   . Osteoarthritis Mother   . Osteoarthritis Father     Social History   Tobacco Use  . Smoking status: Current Every Day Smoker    Packs/day: 0.50    Types: Cigarettes  . Smokeless tobacco: Never Used  Vaping  Use  . Vaping Use: Never used  Substance Use Topics  . Alcohol use: No  . Drug use: No    Types: Cocaine    Comment:  (denies use 05/12/2017)crack/ sporadic- none since 2014    Home Medications Prior to Admission medications   Medication Sig Start Date End Date Taking? Authorizing Provider  amLODipine (NORVASC) 10 MG tablet Take 1 tablet (10 mg total) by mouth daily after supper. 05/13/17   Dorena Dew, FNP  cyclobenzaprine (FLEXERIL) 10 MG tablet Take 1 tablet (10 mg total) by mouth 3 (three) times daily as needed for muscle spasms. 10/10/39   Delora Fuel, MD  furosemide (LASIX) 40 MG tablet Take 1 tablet (40 mg total) by mouth daily. 11/12/20   Delora Fuel, MD  lisinopril-hydrochlorothiazide (PRINZIDE,ZESTORETIC) 20-25 MG tablet Take 1 tablet by mouth daily. 03/11/17   Fransico Meadow, PA-C  oxyCODONE (ROXICODONE) 5 MG immediate release tablet Take 1 tablet (5  mg total) by mouth every 4 (four) hours as needed for severe pain. Patient taking differently: Take 15 mg by mouth every 4 (four) hours as needed for severe pain.  02/07/17   Blue, Olivia C, PA-C  phenol (CHLORASEPTIC) 1.4 % LIQD Use as directed 2 sprays in the mouth or throat as needed for throat irritation / pain. 03/15/19   Antonietta Breach, PA-C  predniSONE (STERAPRED UNI-PAK 21 TAB) 10 MG (21) TBPK tablet Take by mouth daily. Take 6 tabs by mouth daily  for 2 days, then 5 tabs for 2 days, then 4 tabs for 2 days, then 3 tabs for 2 days, 2 tabs for 2 days, then 1 tab by mouth daily for 2 days 08/28/19   McDonald, Mia A, PA-C  QUEtiapine (SEROQUEL) 50 MG tablet Take 50 mg by mouth at bedtime.    [provider]  dicyclomine (BENTYL) 20 MG tablet Take 1 tablet (20 mg total) by mouth 2 (two) times daily. Patient not taking: Reported on 06/24/2017 09/14/16 11/09/18  Lacretia Leigh, MD  DULoxetine (CYMBALTA) 30 MG capsule Take 1 capsule (30 mg total) by mouth daily. Patient not taking: Reported on 11/09/2018 05/13/17 11/09/18  Dorena Dew, FNP  famotidine (PEPCID) 20 MG tablet Take 1 tablet (20 mg total) by mouth 2 (two) times daily. Patient not taking: Reported on 06/24/2017 09/14/16 11/09/18  Lacretia Leigh, MD  gabapentin (NEURONTIN) 300 MG capsule Take 1 capsule (300 mg total) by mouth 4 (four) times daily. Patient not taking: Reported on 11/09/2018 05/13/17 11/09/18  Dorena Dew, FNP  omeprazole (PRILOSEC) 20 MG capsule Take 1 capsule (20 mg total) by mouth daily. Patient not taking: Reported on 06/24/2017 07/19/16 11/09/18  Okey Regal, PA-C    Allergies    Ceftin  Review of Systems   Review of Systems  Unable to perform ROS: Mental status change    Physical Exam Updated Vital Signs BP 104/85   Pulse 87   Temp 99.2 F (37.3 C) (Oral)   Resp (!) 27   Ht 5' 6.5" (1.689 m)   Wt (!) 163.3 kg   LMP 06/19/2014   SpO2 99%   BMI 57.24 kg/m   Physical Exam Vitals and nursing note reviewed.    Constitutional:      Appearance: She is well-developed. She is obese.     Comments: Obese female, somnolence with sonorous breathing  HENT:     Head: Normocephalic and atraumatic.  Eyes:     Conjunctiva/sclera: Conjunctivae normal.  Cardiovascular:     Rate  and Rhythm: Normal rate and regular rhythm.     Pulses: Normal pulses.     Heart sounds: Normal heart sounds.  Pulmonary:     Effort: Pulmonary effort is normal.     Breath sounds: Normal breath sounds.  Abdominal:     Palpations: Abdomen is soft.     Tenderness: There is no abdominal tenderness.  Musculoskeletal:        General: No deformity.     Cervical back: Neck supple.  Skin:    Findings: No rash.  Neurological:     GCS: GCS eye subscore is 3. GCS verbal subscore is 3. GCS motor subscore is 4.     Comments: Somnolent, but arousal to painful stimuli.     ED Results / Procedures / Treatments   Labs (all labs ordered are listed, but only abnormal results are displayed) Labs Reviewed  COMPREHENSIVE METABOLIC PANEL - Abnormal; Notable for the following components:      Result Value   Sodium 133 (*)    Chloride 97 (*)    BUN 35 (*)    Creatinine, Ser 4.26 (*)    Calcium 8.6 (*)    Total Protein 8.4 (*)    AST 69 (*)    Total Bilirubin 1.6 (*)    GFR calc non Af Amer 12 (*)    GFR calc Af Amer 14 (*)    All other components within normal limits  SALICYLATE LEVEL - Abnormal; Notable for the following components:   Salicylate Lvl <1.6 (*)    All other components within normal limits  CBC WITH DIFFERENTIAL/PLATELET - Abnormal; Notable for the following components:   Monocytes Absolute 1.1 (*)    Abs Immature Granulocytes 0.09 (*)    All other components within normal limits  RAPID URINE DRUG SCREEN, HOSP PERFORMED - Abnormal; Notable for the following components:   Opiates POSITIVE (*)    Cocaine POSITIVE (*)    Benzodiazepines POSITIVE (*)    All other components within normal limits  SARS CORONAVIRUS 2 BY  RT PCR (HOSPITAL ORDER, [facility])  ETHANOL  COOXEMETRY PANEL  ACETAMINOPHEN LEVEL  URINALYSIS, ROUTINE W REFLEX MICROSCOPIC  POC URINE PREG, ED  TROPONIN I (HIGH SENSITIVITY)    EKG EKG Interpretation  Date/Time:  Wednesday September 06 2019 12:11:27 EDT Ventricular Rate:  78 PR Interval:    QRS Duration: 93 QT Interval:  410 QTC Calculation: 467 R Axis:   66 Text Interpretation: Sinus rhythm LAE, consider biatrial enlargement No significant change since last tracing Confirmed by Fredia Sorrow [phone]) on 09/06/2019 12:59:26 PM    Radiology No results found.  Procedures .Critical Care Performed by: Domenic Moras, PA-C Authorized by: Domenic Moras, PA-C   Critical care provider statement:    Critical care time (minutes):  50   Critical care was time spent personally by me on the following activities:  Discussions with consultants, evaluation of patient's response to treatment, examination of patient, ordering and performing treatments and interventions, ordering and review of laboratory studies, ordering and review of radiographic studies, pulse oximetry, re-evaluation of patient's condition, obtaining history from patient or surrogate and review of old charts   (including critical care time)  Medications Ordered in ED Medications  naloxone HCl (NARCAN) 4 mg in dextrose 5 % 250 mL infusion (1 mg/hr Intravenous New Bag/Given 09/06/19 1317)  sodium chloride 0.9 % bolus 1,000 mL (has no administration in time range)  sodium chloride 0.9 % bolus 1,000 mL (1,000  mLs Intravenous New Bag/Given 09/06/19 1217)  naloxone [facility]) nasal spray 4 mg/0.1 mL (1 spray Nasal Provided for home use 09/06/19 1213)  naloxone (NARCAN) injection 0.4 mg (0.4 mg Intravenous Given 09/06/19 1240)    ED Course  I have reviewed the triage vital signs and the nursing notes.  Pertinent labs & imaging results that were available during my care of the patient were reviewed by me and  considered in my medical decision making (see chart for details).    MDM Rules/Calculators/A&P                          BP 104/85   Pulse 87   Temp 99.2 F (37.3 C) (Oral)   Resp (!) 27   Ht 5' 6.5" (1.689 m)   Wt (!) 163.3 kg   LMP 06/19/2014   SpO2 99%   BMI 57.24 kg/m   Final Clinical Impression(s) / ED Diagnoses Final diagnoses:  Overdose opiate, accidental or unintentional, initial encounter ([place])  AKI (acute kidney injury) ([place])  Cocaine abuse ([place])    Rx / DC Orders ED Discharge Orders    None     12:46 PM Pt with known hx of polysubstance abuse (no hx of IVDU) brought here for unresponsiveness likely 2/2 opiate use.  Pt received 4mg  of Narcan at the scene and became agitated.  She became somnolent once arrived to ER.  I gave 0.4mg  of narcan x 2 with temporary improvement of mentation but pt became somnolent with sonorous breath sounds.  Will initiate carcan drips and will consult intensivist for admission.  Suspect polypharmacy use.  Care discussed with Dr. Rogene Houston.   1:37 PM Patient still somnolent, occasionally sit up in bed in the lay back down.  At this time she is not responding to any verbal stimuli.  No hypoxia.  She is protecting her airway.  Labs remarkable for acute kidney injury with BUN 35, creatinine 4.26 which is markedly elevated from prior value.  She has normal potassium level.  UDS is currently pending.  We will also obtain head CT scan, and will continue to give IV hydration.  Patient has received approximately 1 L of IV fluid, will give 1L additional IV fluid.  Will consult Critical Care specialist for admission.   1:55 PM Appreciate consultation from intensivist Dr. Lake Bells who agrees to see and admit pt.  UDS positive for benzo, opiate and cocaine.   Linda Sheppard was evaluated in Emergency Department on 09/06/2019 for the symptoms described in the history of present illness. She was evaluated in the context of the global COVID-19 pandemic,  which necessitated consideration that the patient might be at risk for infection with the SARS-CoV-2 virus that causes COVID-19. Institutional protocols and algorithms that pertain to the evaluation of patients at risk for COVID-19 are in a state of rapid change based on information released by regulatory bodies including the CDC and federal and state organizations. These policies and algorithms were followed during the patient's care in the ED.    Domenic Moras, PA-C 09/06/19 1357    Fredia Sorrow, MD 09/07/19 [phone]

## 2019-09-06 NOTE — ED Provider Notes (Signed)
Medical screening examination/treatment/procedure(s) were conducted as a shared visit with non-physician practitioner(s) and myself.  I personally evaluated the patient during the encounter.  EKG Interpretation  Date/Time:  Wednesday September 06 2019 12:11:27 EDT Ventricular Rate:  78 PR Interval:    QRS Duration: 93 QT Interval:  410 QTC Calculation: 467 R Axis:   66 Text Interpretation: Sinus rhythm LAE, consider biatrial enlargement No significant change since last tracing Confirmed by Fredia Sorrow [phone]) on 09/06/2019 12:59:26 PM  CRITICAL CARE Performed by: Fredia Sorrow Total critical care time: 40 minutes Critical care time was exclusive of separately billable procedures and treating other patients. Critical care was necessary to treat or prevent imminent or life-threatening deterioration. Critical care was time spent personally by me on the following activities: development of treatment plan with patient and/or surrogate as well as nursing, discussions with consultants, evaluation of patient's response to treatment, examination of patient, obtaining history from patient or surrogate, ordering and performing treatments and interventions, ordering and review of laboratory studies, ordering and review of radiographic studies, pulse oximetry and re-evaluation of patient's condition.  Patient found by an off-duty paramedic in a running vehicle in a [place] parking lot.  Unresponsive.  Patient was given intranasal Narcan.  Woke up.  Did admit to snorting oxycodone and did admit to doing crack cocaine earlier today.  Patient became somewhat agitated.  Brought in by EMS patient was somnolent when she got here given another dose of Narcan 0.4 mg.  Woke up a little bit but now somnolent again.  Pupils are now pinpoint again.  Patient be started on Narcan drip.  Patient will require admission I believe the admission needs to be through critical care to the ICU and neuro Narcan drip.  I suspect  this is a polypharmacy event.  In addition to the labs for drug overdose which are still pending.  I would get a consider getting CT head as well.   Patient has sort of a strange jerking motion does not seem to be consistent with seizures.  We will set up hold the hand rails and then go back in the bed.  This is repeated several times.  Not exactly sure what to make of that but do not think that it seizure activity.  Patient also has a history of bipolar and schizophrenia.   Fredia Sorrow, MD 09/06/19 1311

## 2019-09-06 NOTE — ED Notes (Signed)
Patient belongings bags were placed in the cabinet behind the 9-12 nurses station.
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
## 2019-09-06 NOTE — Progress Notes (Signed)
[facility] Progress Note Patient Name: Linda Sheppard DOB: 03-20-1975 MRN: [id number]   Date of Service  09/06/2019  HPI/Events of Note  Patient admitted with opioid overdose and respiratory depression. She is on a narcan infusion.  eICU Interventions  BIPAP pending resolution of respiratory depression.        Kerry Kass [name] 09/06/2019, 7:53 PM

## 2019-09-07 DIAGNOSIS — F1990 Other psychoactive substance use, unspecified, uncomplicated: Secondary | ICD-10-CM

## 2019-09-07 DIAGNOSIS — T40601D Poisoning by unspecified narcotics, accidental (unintentional), subsequent encounter: Secondary | ICD-10-CM

## 2019-09-07 LAB — BASIC METABOLIC PANEL
Anion gap: 8 (ref 5–15)
BUN: 20 mg/dL (ref 6–20)
CO2: 28 mmol/L (ref 22–32)
Calcium: 8.7 mg/dL — ABNORMAL LOW (ref 8.9–10.3)
Chloride: 102 mmol/L (ref 98–111)
Creatinine, Ser: 1.74 mg/dL — ABNORMAL HIGH (ref 0.44–1.00)
GFR calc Af Amer: 41 mL/min — ABNORMAL LOW (ref >60)
GFR calc non Af Amer: 36 mL/min — ABNORMAL LOW (ref >60)
Glucose, Bld: 123 mg/dL — ABNORMAL HIGH (ref 70–99)
Potassium: 3.7 mmol/L (ref 3.5–5.1)
Sodium: 138 mmol/L (ref 135–145)

## 2019-09-07 LAB — HIV ANTIBODY (ROUTINE TESTING W REFLEX): HIV Screen 4th Generation wRfx: NONREACTIVE

## 2019-09-07 LAB — CBC
HCT: 37.4 % (ref 36.0–46.0)
Hemoglobin: 12.5 g/dL (ref 12.0–15.0)
MCH: 26.8 pg (ref 26.0–34.0)
MCHC: 33.4 g/dL (ref 30.0–36.0)
MCV: 80.1 fL (ref 80.0–100.0)
Platelets: 292 10*3/uL (ref 150–400)
RBC: 4.67 MIL/uL (ref 3.87–5.11)
RDW: 13.5 % (ref 11.5–15.5)
WBC: 11.2 10*3/uL — ABNORMAL HIGH (ref 4.0–10.5)
nRBC: 0 % (ref 0.0–0.2)

## 2019-09-07 LAB — MAGNESIUM: Magnesium: 1.8 mg/dL (ref 1.7–2.4)

## 2019-09-07 LAB — PHOSPHORUS: Phosphorus: 1.8 mg/dL — ABNORMAL LOW (ref 2.5–4.6)

## 2019-09-07 MED ORDER — LACTATED RINGERS IV SOLN: INTRAVENOUS | Status: DC

## 2019-09-07 MED ORDER — HEPARIN SODIUM (PORCINE) 5000 UNIT/ML IJ SOLN: 5000.0000 [IU] | Freq: Three times a day (TID) | INTRAMUSCULAR | Status: DC

## 2019-09-07 MED ORDER — DULOXETINE HCL 60 MG PO CPEP: 60.0000 mg | ORAL_CAPSULE | Freq: Every day | ORAL | 3 refills | Status: AC

## 2019-09-07 MED ORDER — NALOXONE HCL 4 MG/10ML IJ SOLN
1.0000 mg/h | INTRAVENOUS | Status: DC
  Filled 2019-09-07: qty 10

## 2019-09-07 MED ORDER — QUETIAPINE FUMARATE 100 MG PO TABS: 100.0000 mg | ORAL_TABLET | Freq: Every day | ORAL | Status: DC

## 2019-09-07 MED ORDER — POLYETHYLENE GLYCOL 3350 17 G PO PACK: 17.0000 g | PACK | Freq: Every day | ORAL | Status: DC | PRN

## 2019-09-07 MED ORDER — ONDANSETRON HCL 4 MG/2ML IJ SOLN: 4.0000 mg | Freq: Four times a day (QID) | INTRAMUSCULAR | Status: DC | PRN

## 2019-09-07 MED ORDER — DOCUSATE SODIUM 100 MG PO CAPS: 100.0000 mg | ORAL_CAPSULE | Freq: Two times a day (BID) | ORAL | Status: DC | PRN

## 2019-09-07 MED ORDER — DULOXETINE HCL 30 MG PO CPEP: 60.0000 mg | ORAL_CAPSULE | Freq: Every day | ORAL | Status: DC

## 2019-09-07 NOTE — Discharge Summary (Signed)
Physician Discharge Summary  Patient ID: Linda Sheppard MRN: [id number] DOB/AGE: 10-15-1975 44 y.o.  Admit date: 09/06/2019 Discharge date: 09/07/2019    Discharge Diagnoses: Drug overdose                                                  DISCHARGE PLAN BY DIAGNOSIS       Discharge Plan: Discharge home Drug use counseling Continue antihypertensives   DISCHARGE SUMMARY  Was admitted with altered mental status Admits to using her usual pain medication for chronic back pain, also admitted using cocaine Required Narcan in the field but did not stay alert, she was put on a Narcan drip in the emergency department Now back to her usual Stable hemodynamics Wants to go home            SIGNIFICANT DIAGNOSTIC STUDIES Toxicology significant for opiates, cocaine, benzodiazepines  MICRO DATA  None  ANTIBIOTICS None  Discharge Exam: General: Middle-age, obese, does not appear to be in distress Neuro: Alert and oriented x3, no focal findings CV: S1-S2 appreciated PULM: Clear breath sounds GI: Bowel sounds appreciated Extremities: No clubbing, no edema  Vitals:   09/07/19 0430 09/07/19 0530 09/07/19 0700 09/07/19 0800  BP: 130/69 (!) 147/108 109/69 99/62  Pulse: 65 79 85 90  Resp: (!) 32 (!) 23 (!) 30 13  Temp:      TempSrc:      SpO2: (!) 85% 100% (!) 85% 93%  Weight:      Height:         Discharge Labs  BMET Recent Labs  Lab 09/06/19 1157 09/07/19 0446  NA 133* 138  K 4.9 3.7  CL 97* 102  CO2 24 28  GLUCOSE 89 123*  BUN 35* 20  CREATININE 4.26* 1.74*  CALCIUM 8.6* 8.7*  MG  --  1.8  PHOS  --  1.8*    CBC Recent Labs  Lab 09/06/19 1157 09/07/19 0524  HGB 12.7 12.5  HCT 37.6 37.4  WBC 9.9 11.2*  PLT 261 292    Anti-Coagulation No results for input(s): INR in the last 168 hours.  Discharge Instructions    Diet - low sodium heart healthy   Complete by: As directed    Increase activity slowly   Complete by: As directed            Allergies as of 09/07/2019      Reactions   Ceftin Anaphylaxis      Medication List    STOP taking these medications   Chloraseptic 1.4 % Liqd Generic drug: phenol   cyclobenzaprine 10 MG tablet Commonly known as: FLEXERIL   furosemide 40 MG tablet Commonly known as: LASIX   predniSONE 10 MG (21) Tbpk tablet Commonly known as: STERAPRED UNI-PAK 21 TAB     TAKE these medications   amLODipine 10 MG tablet Commonly known as: NORVASC Take 1 tablet (10 mg total) by mouth daily after supper.   DULoxetine 60 MG capsule Commonly known as: CYMBALTA Take 1 capsule (60 mg total) by mouth daily.   gabapentin 300 MG capsule Commonly known as: NEURONTIN Take 300 mg by mouth in the morning and at bedtime.   lisinopril-hydrochlorothiazide 20-25 MG tablet Commonly known as: ZESTORETIC Take 1 tablet by mouth daily.   oxyCODONE 15 MG immediate release tablet Commonly known as: ROXICODONE Take 15 mg  by mouth 4 (four) times daily as needed for pain.       Disposition: Discharge home  Discharged Condition: Linda Sheppard has met maximum benefit of inpatient care and is medically stable and cleared for discharge.  Patient is pending follow up as above.      Time spent on disposition:  30 Minutes.   Signed: Sherrilyn Rist, MD [facility] [phone]

## 2019-09-07 NOTE — ED Notes (Signed)
Admitting provider at bedside reporting that pt can go home in one hour if she does ok off of the medicine.

## 2019-09-07 NOTE — Progress Notes (Signed)
Pt awake and alert. Bipap not needed at this time

## 2019-09-07 NOTE — Progress Notes (Signed)
Pt seen, found off bipap.  HR73, rr22, spo2 99% on room air.  Per RN, pt took bipap mask off a few hours ago and refusing to put back on.  Pt is more awake and alert.  Bipap remains in room on standby.

## 2019-09-07 NOTE — ED Notes (Signed)
Pt is requesting to leave AMA. Pt reports that she takes oxycodone every 4 hours and she is not going to sit in the hospital and not receive her medicine. PA for pt paged by Lattie Haw, Network engineer.

## 2019-09-07 NOTE — ED Notes (Signed)
Given Food and beverage

## 2019-09-07 NOTE — ED Notes (Signed)
Patient more alert at this time up and asking questions, refusing to wear bipap mask

## 2019-09-07 NOTE — Progress Notes (Signed)
NAME:  Linda Sheppard, MRN:  [id number], DOB:  [birth year], LOS: 1 ADMISSION DATE:  09/06/2019, CONSULTATION DATE:  6/30 REFERRING MD:  Rogene Houston, CHIEF COMPLAINT:  Found down   Brief History   44 y/o female with multiple medical problems including narcotic use was found in her car today unresponsive.  She received narcan in the ED, briefly woke up but later became encephalopathic and required a narcan infusion.  History of present illness   PCCM asked to admit.  She is obtunded due to narcotic overdose and could not provide history.  See brief history above. History was obtained by the patient's caregivers.  She was found in her car in a retail parking lot today, had pinpoint pupils.  She was given narcan and briefly woke up but then fell asleep again.  She was started on a narcan infusion and PCCM was consulted for admission.  Past Medical History  Bipolar disorder Depression GERD Hypertension Schizophrenia Polysubstance abuse  Significant Hospital Events   6/30 admission  Consults:    Procedures:    Significant Diagnostic Tests:  6/30 CT head pending>   Micro Data:  6/30 SARS COV 2 >    Antimicrobials:  none  Interim history/subjective:  She continues to state that she did not overdose has been on opiates for chronic back pain She however admitted to using cocaine She felt she was just dehydrated and passed out from that  Objective   Blood pressure 99/62, pulse 90, temperature 99.2 F (37.3 C), temperature source Oral, resp. rate 13, height 5' 6.5" (1.689 m), weight (!) 163.3 kg, last menstrual period 06/19/2014, SpO2 93 %.        Intake/Output Summary (Last 24 hours) at 09/07/2019 0953 Last data filed at 09/06/2019 2021 Gross per 24 hour  Intake 2000 ml  Output --  Net 2000 ml   Filed Weights   09/06/19 1151  Weight: (!) 163.3 kg    Examination:  General: Awake and alert, appropriate HENT: NCAT OP clear, multiple facial piercings PULM: CTA B, normal  effort CV: RRR, no mgr GI: Bowel sounds appreciated MSK: normal bulk and tone Neuro: Awake and alert, appropriate, moving all extremities   Resolved Hospital Problem list     Assessment & Plan:  Acute metabolic encephalopathy most likely due to narcotic overdose based on clinical history, physical exam and response to narcan Narcan was discontinued -Patient is appropriate -Has no significant issues at present -I think she can be discharged safely  Drug use -Counseled about the implications of continuing drug use -She is aware of the problems she can cause her self -She will continue to work on this  AKI: likely due to hypovolemia due to poor po intake in setting of encephalopathy -This should resolve with hydration  Hypertension:  -Continue home medications  Schizophrenia, depression:  Continue home seroquel, cymbalta  Best practice:  Diet: npo Pain/Anxiety/Delirium protocol (if indicated): n/a VAP protocol (if indicated): n/a DVT prophylaxis: sub q heparin GI prophylaxis: n/a Glucose control: monitor Mobility: bed rest Code Status: full Family Communication: tried to cal her father, no answer, could not leave message Disposition: remain in ICU  Labs   CBC: Recent Labs  Lab 09/06/19 1157 09/07/19 0524  WBC 9.9 11.2*  NEUTROABS 7.2  --   HGB 12.7 12.5  HCT 37.6 37.4  MCV 80.0 80.1  PLT 261 782    Basic Metabolic Panel: Recent Labs  Lab 09/06/19 1157 09/07/19 0446  NA 133* 138  K 4.9 3.7  CL 97* 102  CO2 24 28  GLUCOSE 89 123*  BUN 35* 20  CREATININE 4.26* 1.74*  CALCIUM 8.6* 8.7*  MG  --  1.8  PHOS  --  1.8*   GFR: Estimated Creatinine Clearance: 67.6 mL/min (A) (by C-G formula based on SCr of 1.74 mg/dL (H)). Recent Labs  Lab 09/06/19 1157 09/07/19 0524  WBC 9.9 11.2*    Liver Function Tests: Recent Labs  Lab 09/06/19 1157  AST 69*  ALT 25  ALKPHOS 74  BILITOT 1.6*  PROT 8.4*  ALBUMIN 4.2   No results for input(s): LIPASE,  AMYLASE in the last 168 hours. No results for input(s): AMMONIA in the last 168 hours.  ABG    Component Value Date/Time   TCO2 24 08/24/2013 1100   O2SAT 56.6 09/06/2019 1431     Coagulation Profile: No results for input(s): INR, PROTIME in the last 168 hours.  Cardiac Enzymes: Recent Labs  Lab 09/06/19 1803  CKTOTAL 689*    HbA1C: Hemoglobin A1C  Date/Time Value Ref Range Status  05/13/2017 10:40 AM 5.6  Final    CBG: No results for input(s): GLUCAP in the last 168 hours.  Review of Systems:   Cannot obtain due to change in mental status  Past Medical History  She,  has a past medical history of Anemia, Anxiety, Arthritis, Bipolar 1 disorder ([place]), Depression, GERD (gastroesophageal reflux disease), Hypertension, Lipoma, Obesity, Pneumonia, Polysubstance abuse ([place]), and Schizophrenia ([place]).   Surgical History    Past Surgical History:  Procedure Laterality Date  . CESAREAN SECTION    . DENTAL SURGERY    . LIPOMA EXCISION Left 07/24/2016   Procedure: EXCISION LIPOMA LEFT SHOULDER;  Surgeon: Greer Pickerel, MD;  Location: [facility];  Service: General;  Laterality: Left;  . SUPRACERVICAL ABDOMINAL HYSTERECTOMY N/A 07/31/2014   Procedure: HYSTERECTOMY SUPRACERVICAL ABDOMINAL;  Surgeon: Emily Filbert, MD;  Location: [facility];  Service: Gynecology;  Laterality: N/A;  Requested 07/31/14 @ 10:00a with Nelwyn Salisbury first assist  . TUBAL LIGATION       Social History   reports that she has been smoking cigarettes. She has been smoking about 0.50 packs per day. She has never used smokeless tobacco. She reports that she does not drink alcohol and does not use drugs.   Family History   Her family history includes Hypertension in her mother; Osteoarthritis in her father and mother.   Allergies Allergies  Allergen Reactions  . Ceftin Anaphylaxis    The patient is critically ill with multiple organ systems failure and requires high complexity decision making for assessment and support,  frequent evaluation and titration of therapies, application of advanced monitoring technologies and extensive interpretation of multiple databases. Critical Care Time devoted to patient care services described in this note independent of APP/resident time (if applicable)  is 30 minutes.   Sherrilyn Rist MD [facility] Personal pager: [phone] If unanswered, please page CCM On-call: [phone]

## 2019-09-07 NOTE — ED Notes (Signed)
Patient given snack.  

## 2019-09-07 NOTE — ED Notes (Signed)
Critical care provider at bedside.  

## 2020-04-28 ENCOUNTER — Other Ambulatory Visit: Payer: Self-pay

## 2020-04-28 ENCOUNTER — Ambulatory Visit (HOSPITAL_COMMUNITY)
Admission: EM | Admit: 2020-04-28 | Discharge: 2020-04-28 | Disposition: A | Discharge status: Home / Self Care | Payer: Medicaid Other | Attending: Medical Oncology | Admitting: Medical Oncology

## 2020-04-28 ENCOUNTER — Encounter (HOSPITAL_COMMUNITY): Payer: Self-pay | Admitting: Emergency Medicine

## 2020-04-28 DIAGNOSIS — S39012A Strain of muscle, fascia and tendon of lower back, initial encounter: Secondary | ICD-10-CM

## 2020-04-28 MED ORDER — PREDNISONE 10 MG (21) PO TBPK
ORAL_TABLET | Freq: Every day | ORAL | 0 refills | Status: DC
Start: 2020-04-28 — End: 2020-06-16

## 2020-04-28 MED ORDER — CYCLOBENZAPRINE HCL 10 MG PO TABS
10.0000 mg | ORAL_TABLET | Freq: Two times a day (BID) | ORAL | 0 refills | Status: AC | PRN
Start: 2020-04-28 — End: ?

## 2020-04-28 NOTE — ED Triage Notes (Signed)
Patient states she has ddd, L5 issues in her history.  Patient lifted and slid a box across floor.  But 30 minutes later, could not move.  This episode started 3 days ago.  Patient has had similar issues before.    "I cant work no more, ive tried for disability and cant get it "

## 2020-04-28 NOTE — ED Provider Notes (Signed)
[facility]    CSN: [id number] Arrival date & time: 04/28/20  1139      History   Chief Complaint Chief Complaint  Patient presents with  . Back Pain    HPI Linda Sheppard is a 45 y.o. female.   HPI   Back Pain:  Pt states that for the past 3 days she has had lumbar back pain.  She states that she lifted and slowed a box across the floor and about 30 minutes later had significant back pain.  She has a history of degenerative disc disease and disc herniation.  Pain was previously managed by pain management however she states that she has not been at work in a month and has not been able to see her pain management specialist for the past few months because of this.  She states that her current pain has improved over the past 2 days but she asked if she can get all of her pain medications refilled at this time for current flare. No incontinence, numbness of groin, neuro changes of legs.   Past Medical History:  Diagnosis Date  . Anemia   . Anxiety   . Arthritis   . Bipolar 1 disorder ([place])   . Depression   . GERD (gastroesophageal reflux disease)   . Hypertension   . Lipoma    left shoulder  . Obesity   . Pneumonia   . Polysubstance abuse ([place])   . Schizophrenia [facility])     Patient Active Problem List   Diagnosis Date Noted  . Substance use disorder 09/07/2019  . Narcotic overdose ([place]) 09/06/2019  . Lipoma of left shoulder 07/24/2016  . Wound infection after surgery 08/11/2014  . Post-operative state 07/31/2014  . Diverticulosis 07/02/2014  . Intramural leiomyoma of uterus 07/02/2014  . Pelvic pain in female 07/02/2014  . Morbid obesity ([place]) 07/02/2014  . Diverticulosis of large intestine without hemorrhage 07/02/2014  . Fibroid uterus, with degeneration 05/25/2014  . Alcohol abuse 10/05/2012  . Cocaine abuse ([place]) 10/05/2012  . Chronic back pain greater than 3 months duration 07/16/2012  . Major depressive disorder, recurrent episode, moderate ([place])  06/20/2012    Past Surgical History:  Procedure Laterality Date  . CESAREAN SECTION    . DENTAL SURGERY    . LIPOMA EXCISION Left 07/24/2016   Procedure: EXCISION LIPOMA LEFT SHOULDER;  Surgeon: Greer Pickerel, MD;  Location: [facility];  Service: General;  Laterality: Left;  . SUPRACERVICAL ABDOMINAL HYSTERECTOMY N/A 07/31/2014   Procedure: HYSTERECTOMY SUPRACERVICAL ABDOMINAL;  Surgeon: Emily Filbert, MD;  Location: [facility];  Service: Gynecology;  Laterality: N/A;  Requested 07/31/14 @ 10:00a with Nelwyn Salisbury first assist  . TUBAL LIGATION      OB History    Gravida  5   Para  3   Term  1   Preterm  2   AB  1   Living  2     SAB      IAB      Ectopic      Multiple      Live Births  3        Obstetric Comments  1- SIDS 1- Stillbirth 2 living children         Home Medications    Prior to Admission medications   Medication Sig Start Date End Date Taking? Authorizing Provider  amLODipine (NORVASC) 10 MG tablet Take 1 tablet (10 mg total) by mouth daily after supper. Patient not taking: Reported on 04/28/2020  05/13/17   Dorena Dew, FNP  DULoxetine (CYMBALTA) 60 MG capsule Take 1 capsule (60 mg total) by mouth daily. Patient not taking: Reported on 04/28/2020 09/07/19   Laurin Coder, MD  gabapentin (NEURONTIN) 300 MG capsule Take 300 mg by mouth in the morning and at bedtime. Patient not taking: Reported on 04/28/2020 09/04/19   [provider]  lisinopril-hydrochlorothiazide (PRINZIDE,ZESTORETIC) 20-25 MG tablet Take 1 tablet by mouth daily. Patient not taking: Reported on 04/28/2020 03/11/17   Fransico Meadow, PA-C  oxyCODONE (ROXICODONE) 15 MG immediate release tablet Take 15 mg by mouth 4 (four) times daily as needed for pain. Patient not taking: Reported on 04/28/2020 08/31/19   [provider]  dicyclomine (BENTYL) 20 MG tablet Take 1 tablet (20 mg total) by mouth 2 (two) times daily. Patient not taking: Reported on 06/24/2017 09/14/16 11/09/18  Lacretia Leigh, MD  famotidine (PEPCID) 20 MG tablet Take 1 tablet (20 mg total) by mouth 2 (two) times daily. Patient not taking: Reported on 06/24/2017 09/14/16 11/09/18  Lacretia Leigh, MD  omeprazole (PRILOSEC) 20 MG capsule Take 1 capsule (20 mg total) by mouth daily. Patient not taking: Reported on 06/24/2017 07/19/16 11/09/18  Okey Regal, PA-C    Family History Family History  Problem Relation Age of Onset  . Hypertension Mother   . Osteoarthritis Mother   . Osteoarthritis Father     Social History Social History   Tobacco Use  . Smoking status: Former Smoker    Packs/day: 0.50    Types: Cigarettes  . Smokeless tobacco: Never Used  Vaping Use  . Vaping Use: Never used  Substance Use Topics  . Alcohol use: No  . Drug use: No    Types: Cocaine    Comment:  (denies use 05/12/2017)crack/ sporadic- none since 2014     Allergies   Ceftin   Review of Systems Review of Systems  As stated above in HPI Physical Exam Triage Vital Signs ED Triage Vitals  Enc Vitals Group     BP 04/28/20 1235 (!) 162/115     Pulse Rate 04/28/20 1235 86     Resp 04/28/20 1235 20     Temp 04/28/20 1235 99 F (37.2 C)     Temp Source 04/28/20 1235 Oral     SpO2 04/28/20 1235 96 %     Weight --      Height --      Head Circumference --      Peak Flow --      Pain Score 04/28/20 1230 10     Pain Loc --      Pain Edu? --      Excl. in [place]? --    No data found.  Updated Vital Signs BP (!) 152/93 (BP Location: Left Arm) Comment: regular cuff, forearm  Pulse 86   Temp 99 F (37.2 C) (Oral)   Resp 20   LMP 06/19/2014   SpO2 96%   Physical Exam Vitals and nursing note reviewed.  Constitutional:      General: She is not in acute distress.    Appearance: She is obese. She is not ill-appearing, toxic-appearing or diaphoretic.     Comments: Pt resting comfortably in playing on her phone in the chair when I walking into the room. She then appears to be uncomfortable saying "oh lord this  pain".   Musculoskeletal:     Cervical back: Normal, normal range of motion and neck supple. No tenderness or bony tenderness.  Thoracic back: Normal. No bony tenderness.     Lumbar back: Spasms and tenderness present. No bony tenderness.       Back:     Comments: Normal straight leg raise bilaterally. Normal gait and sensation. Normal extremity strength 5/5  Neurological:     Mental Status: She is alert.      UC Treatments / Results  Labs (all labs ordered are listed, but only abnormal results are displayed) Labs Reviewed - No data to display  EKG   Radiology No results found.  Procedures Procedures (including critical care time)  Medications Ordered in UC Medications - No data to display  Initial Impression / Assessment and Plan / UC Course  I have reviewed the triage vital signs and the nursing notes.  Pertinent labs & imaging results that were available during my care of the patient were reviewed by me and considered in my medical decision making (see chart for details).     New.  No red flag symptoms for herniation which I discussed with patient.  I can empathize with her pain and difficulty with keeping up with her pain specialist but I did discuss our limitations in the urgent care field in terms of pain management.  Given her past medical history I also am not comfortable with prescribing her opioid pain medication.  As she has been out of her oxycodone for a few months I am not comfortable with restarting this and have referred her to her pain specialist for this.  In the meantime I am comfortable with prescribing her a muscle relaxer along with prednisone course for her current symptoms.  We discussed how to use along with common potential side effects and precautions.  We also discussed red flag symptoms and signs of injury.   Final Clinical Impressions(s) / UC Diagnoses   Final diagnoses:  None   Discharge Instructions   None    ED Prescriptions     None     PDMP not reviewed this encounter.   Hughie Closs, [place] 04/28/20 1315

## 2020-06-16 ENCOUNTER — Other Ambulatory Visit: Payer: Self-pay

## 2020-06-16 ENCOUNTER — Encounter (HOSPITAL_COMMUNITY): Payer: Self-pay | Admitting: *Deleted

## 2020-06-16 ENCOUNTER — Ambulatory Visit (HOSPITAL_COMMUNITY)
Admission: EM | Admit: 2020-06-16 | Discharge: 2020-06-16 | Disposition: A | Discharge status: Home / Self Care | Payer: Medicaid Other

## 2020-06-16 DIAGNOSIS — M25561 Pain in right knee: Secondary | ICD-10-CM

## 2020-06-16 DIAGNOSIS — M79671 Pain in right foot: Secondary | ICD-10-CM

## 2020-06-16 DIAGNOSIS — M25562 Pain in left knee: Secondary | ICD-10-CM

## 2020-06-16 DIAGNOSIS — G8929 Other chronic pain: Secondary | ICD-10-CM

## 2020-06-16 DIAGNOSIS — L02419 Cutaneous abscess of limb, unspecified: Secondary | ICD-10-CM

## 2020-06-16 HISTORY — DX: Morbid (severe) obesity due to excess calories: E66.01

## 2020-06-16 MED ORDER — PREDNISONE 10 MG (21) PO TBPK
ORAL_TABLET | ORAL | 0 refills | Status: DC
Start: 2020-06-16 — End: 2020-10-14

## 2020-06-16 MED ORDER — DOXYCYCLINE HYCLATE 100 MG PO TABS
100.0000 mg | ORAL_TABLET | Freq: Two times a day (BID) | ORAL | 0 refills | Status: DC
Start: 2020-06-16 — End: 2020-10-14

## 2020-06-16 NOTE — ED Triage Notes (Signed)
States started having some numbness to right lateral lower leg "for a long time" and pain to right foot onset "a few months ago".  Now states pain had been getting "unbearable" and having "tingling" to RLE.  Denies injury.  Also c/o "pain everywhere" and intermittent SOB over past few days.  Denies any SOB @ present.

## 2020-06-16 NOTE — ED Notes (Signed)
Pt requests to speak to provider, R.Lane PA notified.

## 2020-06-16 NOTE — ED Notes (Addendum)
Per provider and pt:  Pt is being discharged from [facility].  Pt is requesting we call EMS because she would like transport to the ED because "this is the worst pain" of her life. Pt repeatedly requesting EMS be called.  Pointed out to pt that she stated she has had this pain "for a long time, months [her words]", and she drove herself to [facility], and stated she is welcome to drive herself to ED, but pt stated again she wants EMS dispatched. T/C to EMS placed; spoke with dispatch and with EMS supervisor - notified that pt is requesting transport to ED due to leg pain, though we are discharging her to home with dx plantar fasciitis and have given Rxs.  Confirmed with supervisor that we are not able to transport pt to ED, as we are not discharging her to the ED, and we cannot push her to ED in a wheelchair due to her weight of 366#.  Per supervisor, they cannot dispatch a truck from a medical facility per pt request, to go to another medical facility.  Pt notified.  Pt stating, "you didn't even do any XRs"; educated pt that we do not perform radiography, as needless exposure to radiation is not healthy, when there is no injury, and she has been having this particular pain "for months", and based upon provider exam; that based on her exam and history, her provider's medical opinion was the dx she received and has been given Rxs for. Pt assisted by 2 RNs to her car, until pt was seated in driver's seat.

## 2020-06-19 NOTE — ED Provider Notes (Signed)
[place]    CSN: [id number] Arrival date & time: 06/16/20  1736      History   Chief Complaint Chief Complaint  Patient presents with  . Foot Pain  . Shortness of Breath    HPI Linda Sheppard is a 45 y.o. female.   Patient presenting today c/o acute on chronic right heel and bottom of foot pain that she describes as severe and burning in nature. She states this has been present since at least last summer but worse the last few days. Has known hx of neuropathy in this leg from past hx of back issues but feels this is different. Denies known injury, swelling, redness, heat. Worse in the morning when she first tries to walk on it. She is followed by pain mgmt and is on high dose gabapentin, cymbalta, and oxycodone regularly. Of note, has a past hx of narcotic overdose and polysubstance abuse.  She also notes a boil in her left axilla that has been present for months but more painful the last few days. Denies redness, drainage from area, fever chills. Not trying anything for this so far.      Past Medical History:  Diagnosis Date  . Anemia   . Anxiety   . Arthritis   . Bipolar 1 disorder ([place])   . Depression   . GERD (gastroesophageal reflux disease)   . Hypertension   . Lipoma    left shoulder  . Morbid obesity ([place])   . Obesity   . Pneumonia   . Polysubstance abuse ([place])   . Schizophrenia [facility])     Patient Active Problem List   Diagnosis Date Noted  . Substance use disorder 09/07/2019  . Narcotic overdose ([place]) 09/06/2019  . Lipoma of left shoulder 07/24/2016  . Wound infection after surgery 08/11/2014  . Post-operative state 07/31/2014  . Diverticulosis 07/02/2014  . Intramural leiomyoma of uterus 07/02/2014  . Pelvic pain in female 07/02/2014  . Morbid obesity ([place]) 07/02/2014  . Diverticulosis of large intestine without hemorrhage 07/02/2014  . Fibroid uterus, with degeneration 05/25/2014  . Alcohol abuse 10/05/2012  . Cocaine abuse ([place])  10/05/2012  . Chronic back pain greater than 3 months duration 07/16/2012  . Major depressive disorder, recurrent episode, moderate ([place]) 06/20/2012    Past Surgical History:  Procedure Laterality Date  . ABDOMINAL HYSTERECTOMY    . CESAREAN SECTION    . DENTAL SURGERY    . LIPOMA EXCISION Left 07/24/2016   Procedure: EXCISION LIPOMA LEFT SHOULDER;  Surgeon: Greer Pickerel, MD;  Location: [facility];  Service: General;  Laterality: Left;  . SUPRACERVICAL ABDOMINAL HYSTERECTOMY N/A 07/31/2014   Procedure: HYSTERECTOMY SUPRACERVICAL ABDOMINAL;  Surgeon: Emily Filbert, MD;  Location: [facility];  Service: Gynecology;  Laterality: N/A;  Requested 07/31/14 @ 10:00a with Nelwyn Salisbury first assist  . TUBAL LIGATION      OB History    Gravida  5   Para  3   Term  1   Preterm  2   AB  1   Living  2     SAB      IAB      Ectopic      Multiple      Live Births  3        Obstetric Comments  1- SIDS 1- Stillbirth 2 living children         Home Medications    Prior to Admission medications   Medication Sig Start Date End Date  Taking? Authorizing Provider  amLODipine (NORVASC) 10 MG tablet Take 1 tablet (10 mg total) by mouth daily after supper. 05/13/17  Yes Dorena Dew, FNP  CLONAZEPAM PO Take by mouth.   Yes [provider]  doxycycline (VIBRA-TABS) 100 MG tablet Take 1 tablet (100 mg total) by mouth 2 (two) times daily. 06/16/20  Yes Volney American, PA-C  IBUPROFEN PO Take by mouth.   Yes [provider]  lisinopril-hydrochlorothiazide (PRINZIDE,ZESTORETIC) 20-25 MG tablet Take 1 tablet by mouth daily. 03/11/17  Yes Caryl Ada K, PA-C  cyclobenzaprine (FLEXERIL) 10 MG tablet Take 1 tablet (10 mg total) by mouth 2 (two) times daily as needed for muscle spasms. 04/28/20   Hughie Closs, PA-C  DULoxetine (CYMBALTA) 60 MG capsule Take 1 capsule (60 mg total) by mouth daily. Patient not taking: Reported on 04/28/2020 09/07/19   Laurin Coder, MD   gabapentin (NEURONTIN) 300 MG capsule Take 300 mg by mouth in the morning and at bedtime. Patient not taking: Reported on 04/28/2020 09/04/19   [provider]  oxyCODONE (ROXICODONE) 15 MG immediate release tablet Take 15 mg by mouth 4 (four) times daily as needed for pain. Patient not taking: Reported on 04/28/2020 08/31/19   [provider]  predniSONE (STERAPRED UNI-PAK 21 TAB) 10 MG (21) TBPK tablet Take 6 tabs day one, 5 tabs day two, 4 tabs day three, etc 06/16/20   Volney American, PA-C  dicyclomine (BENTYL) 20 MG tablet Take 1 tablet (20 mg total) by mouth 2 (two) times daily. Patient not taking: Reported on 06/24/2017 09/14/16 11/09/18  Lacretia Leigh, MD  famotidine (PEPCID) 20 MG tablet Take 1 tablet (20 mg total) by mouth 2 (two) times daily. Patient not taking: Reported on 06/24/2017 09/14/16 11/09/18  Lacretia Leigh, MD  omeprazole (PRILOSEC) 20 MG capsule Take 1 capsule (20 mg total) by mouth daily. Patient not taking: Reported on 06/24/2017 07/19/16 11/09/18  Okey Regal, PA-C    Family History Family History  Problem Relation Age of Onset  . Hypertension Mother   . Osteoarthritis Mother   . Osteoarthritis Father     Social History Social History   Tobacco Use  . Smoking status: Former Smoker    Packs/day: 0.50    Types: Cigarettes  . Smokeless tobacco: Never Used  Vaping Use  . Vaping Use: Never used  Substance Use Topics  . Alcohol use: No  . Drug use: No    Types: Cocaine     Allergies   Ceftin   Review of Systems Review of Systems PER HPI    Physical Exam Triage Vital Signs ED Triage Vitals  Enc Vitals Group     BP 06/16/20 1750 (!) 141/104     Pulse Rate 06/16/20 1750 91     Resp 06/16/20 1750 20     Temp 06/16/20 1750 98.4 F (36.9 C)     Temp Source 06/16/20 1750 Oral     SpO2 06/16/20 1750 97 %     Weight 06/16/20 1828 (!) 366 lb (166 kg)     Height --      Head Circumference --      Peak Flow --      Pain Score  06/16/20 1751 10     Pain Loc --      Pain Edu? --      Excl. in [place]? --    No data found.  Updated Vital Signs BP (!) 141/104   Pulse 91  Temp 98.4 F (36.9 C) (Oral)   Resp 20   Wt (!) 366 lb (166 kg)   LMP 06/19/2014   SpO2 97%   BMI 58.19 kg/m   Visual Acuity Right Eye Distance:   Left Eye Distance:   Bilateral Distance:    Right Eye Near:   Left Eye Near:    Bilateral Near:     Physical Exam Vitals and nursing note reviewed.  Constitutional:      General: She is not in acute distress.    Appearance: Normal appearance. She is obese. She is not ill-appearing.  HENT:     Head: Atraumatic.  Eyes:     Extraocular Movements: Extraocular movements intact.     Conjunctiva/sclera: Conjunctivae normal.  Cardiovascular:     Rate and Rhythm: Normal rate and regular rhythm.     Heart sounds: Normal heart sounds.  Pulmonary:     Effort: Pulmonary effort is normal. No respiratory distress.     Breath sounds: Normal breath sounds.  Musculoskeletal:     Cervical back: Normal range of motion and neck supple.     Comments: In wheelchair today though able to ambulate when asked, unclear what her baseline is with ambulation assistance No edema, erythema, decreased ROM right LE including right foot. Heel and posterior plantar foot ttp right foot  Skin:    General: Skin is warm and dry.     Comments: 1.5 cm oblong firm non erythematous abscess left axilla. No active drainage  Neurological:     Mental Status: She is oriented to person, place, and time.  Psychiatric:     Comments: Appears anxious      UC Treatments / Results  Labs (all labs ordered are listed, but only abnormal results are displayed) Labs Reviewed - No data to display  EKG   Radiology No results found.  Procedures Procedures (including critical care time)  Medications Ordered in UC Medications - No data to display  Initial Impression / Assessment and Plan / UC Course  I have reviewed the  triage vital signs and the nursing notes.  Pertinent labs & imaging results that were available during my care of the patient were reviewed by me and considered in my medical decision making (see chart for details).     Foot exam consistent with plantar fasciitis. Discussed stretches, home care, prednisone taper, continued pain medications per provider managing these with close monitoring. Abscess left axilla appears more chronic in nature and does not warrant I and D today. Doxycycline sent to reduce flare, warm compresses, good skin hygiene routine. During exam, she asks me if she should go to the hospital to which I responded that I did not see anything emergent on exam but that if she wanted to do that she had the right to go. She requests that we call EMS for transport, which was done but EMS requested self transfer when called. Patient was wheeled down in wheelchair to [facility] ED by nursing staff at patient's request.   Final Clinical Impressions(s) / UC Diagnoses   Final diagnoses:  Right foot pain  Axillary abscess  Chronic pain of both knees   Discharge Instructions   None    ED Prescriptions    Medication Sig Dispense Auth. Provider   doxycycline (VIBRA-TABS) 100 MG tablet Take 1 tablet (100 mg total) by mouth 2 (two) times daily. 14 tablet Volney American, [place]   predniSONE (STERAPRED UNI-PAK 21 TAB) 10 MG (21) TBPK tablet Take 6 tabs  day one, 5 tabs day two, 4 tabs day three, etc 21 tablet Volney American, [place]     PDMP not reviewed this encounter.   Merrie Roof [place], [place] 06/19/20 [phone]

## 2020-08-17 IMAGING — CT CT ANGIO CHEST
2 of 6 series · 18 of 36 positions shown · IV contrast (OMNIPAQUE 350)
Comparison: Two-view chest x-ray 11/08/2018 CTA chest 08/15/2011

CLINICAL DATA: Shortness of breath. Lower extremity swelling for 4
days. Elevated D-dimer. PE suspected, intermediate prob, positive
D-dimer

EXAM:
CT ANGIOGRAPHY CHEST WITH CONTRAST
TECHNIQUE: Multidetector CT imaging of the chest was performed using the
standard protocol during bolus administration of intravenous
contrast. Multiplanar CT image reconstructions and MIPs were
obtained to evaluate the vascular anatomy.
CONTRAST:  100mL OMNIPAQUE IOHEXOL 350 MG/ML SOLN

[Series 6: thins · axial · 0.66mm/px · z∈[+1472,+1692]mm · 17 of 247 slices shown]
[im 13/247  lung]
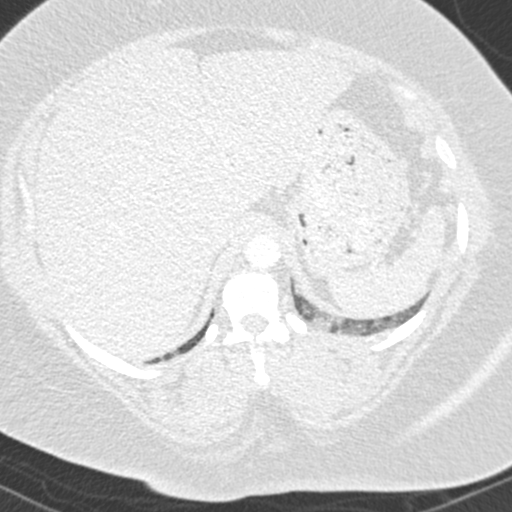
[im 25/247  mediastinal]
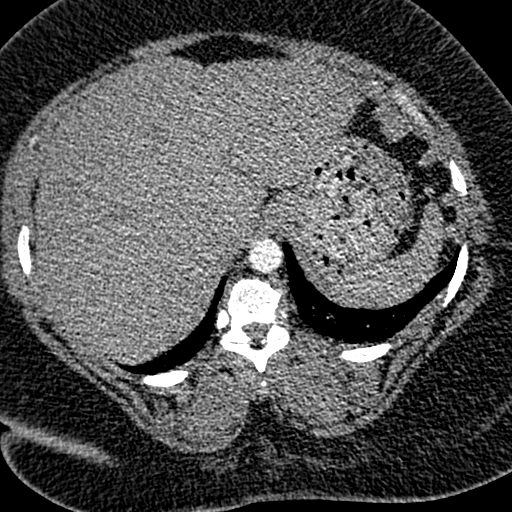
[im 37/247  lung]
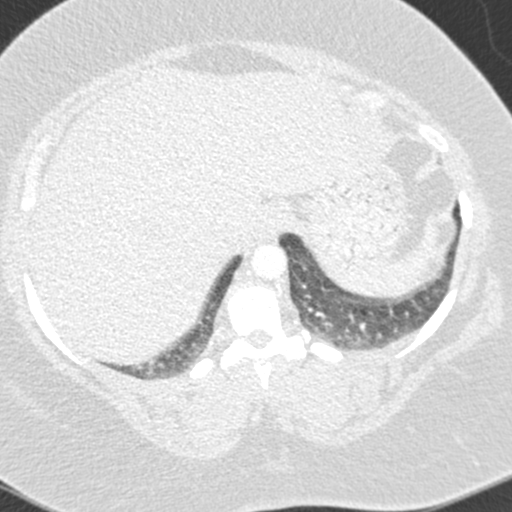
[im 50/247  mediastinal]
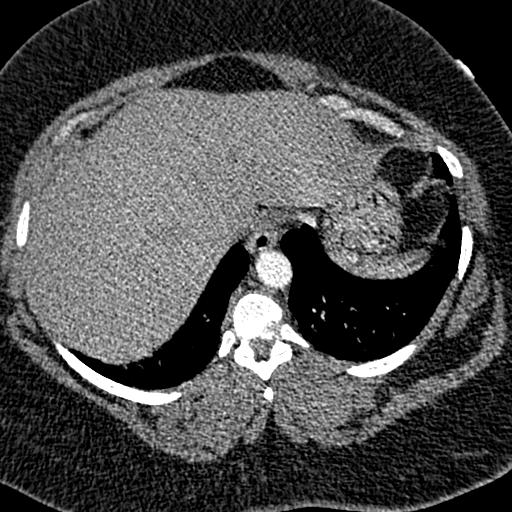
[im 74/247  lung]
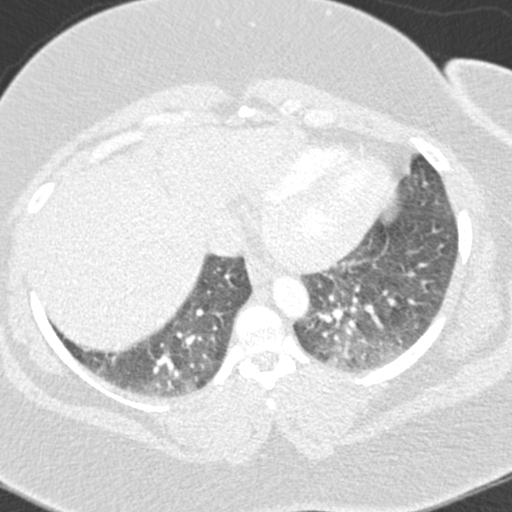
[im 87/247  mediastinal]
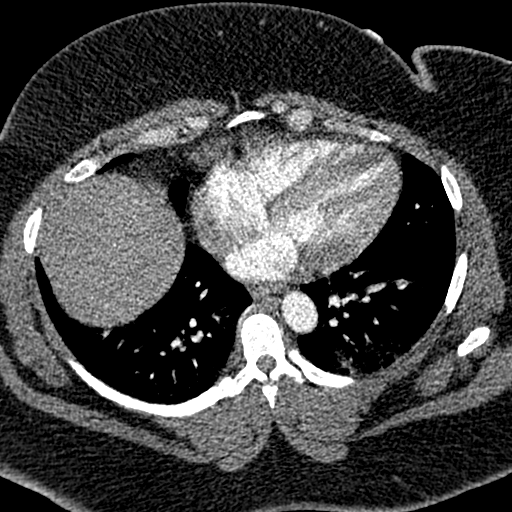
[im 99/247  lung]
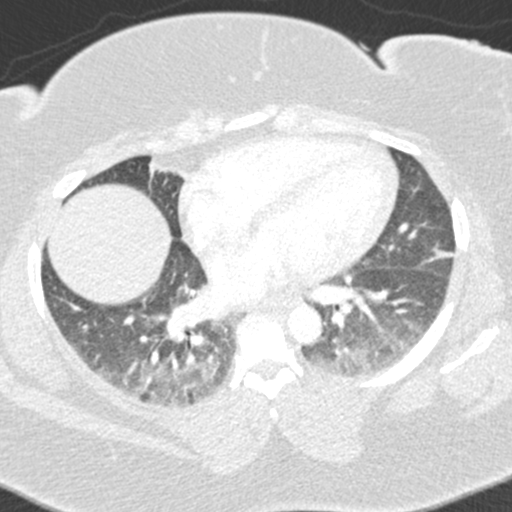
[im 111/247  mediastinal]
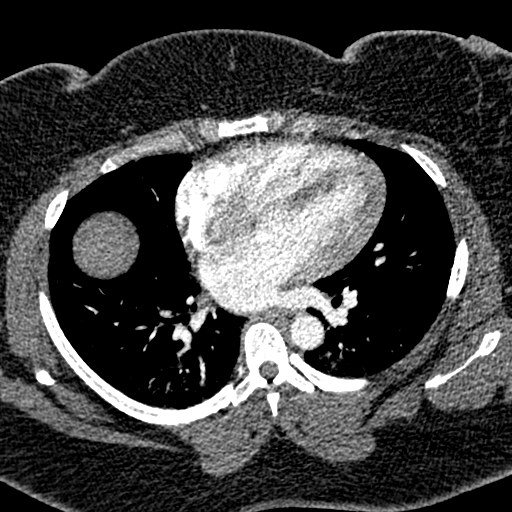
[im 124/247  lung]
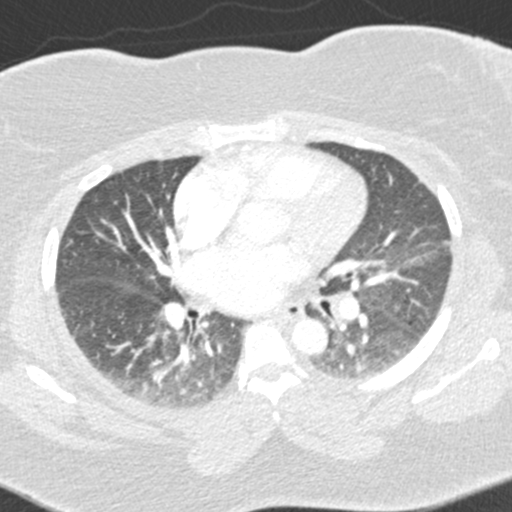
[im 136/247  mediastinal]
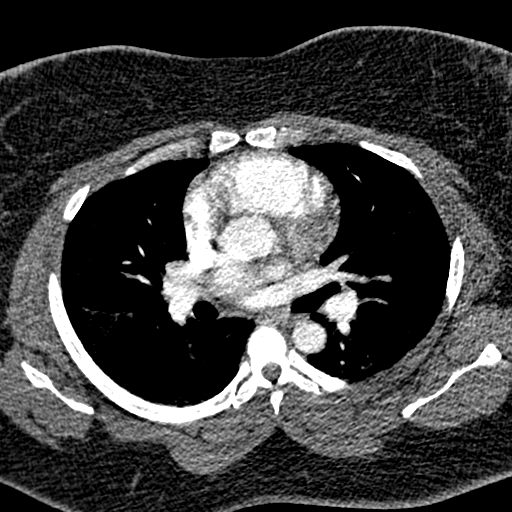
[im 148/247  lung]
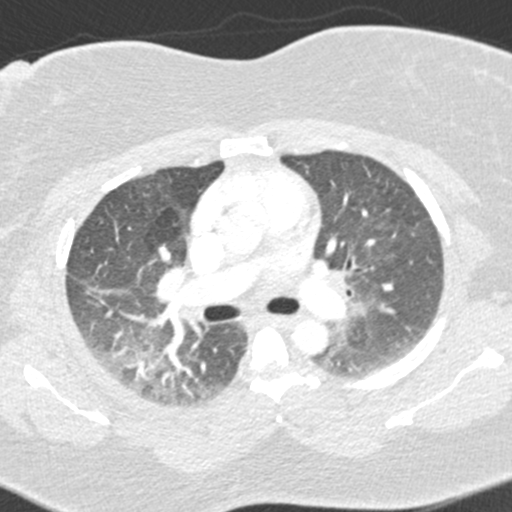
[im 160/247  mediastinal]
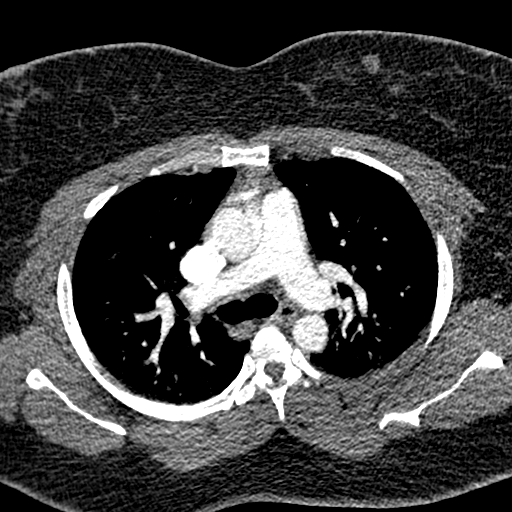
[im 173/247  lung]
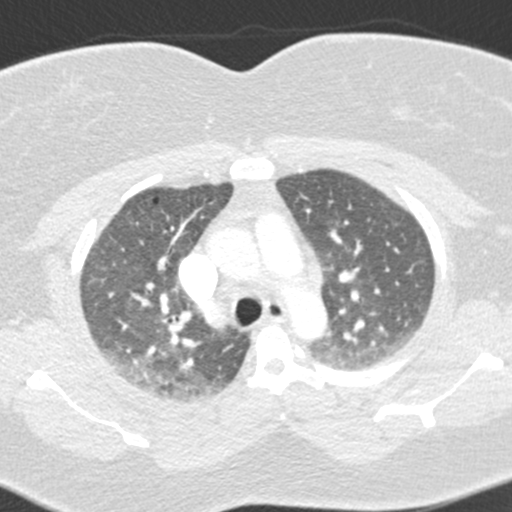
[im 197/247  mediastinal]
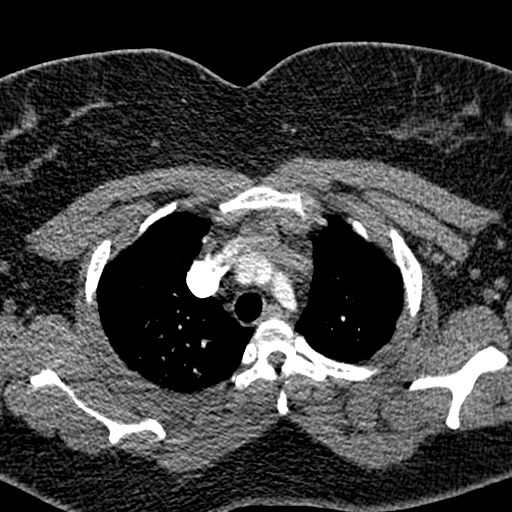
[im 210/247  lung]
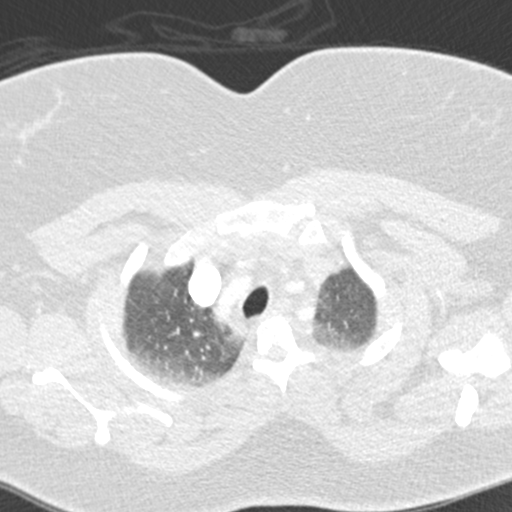
[im 222/247  mediastinal]
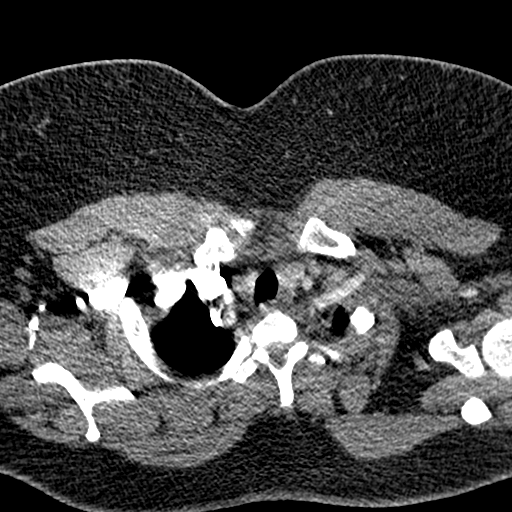
[im 234/247  lung]
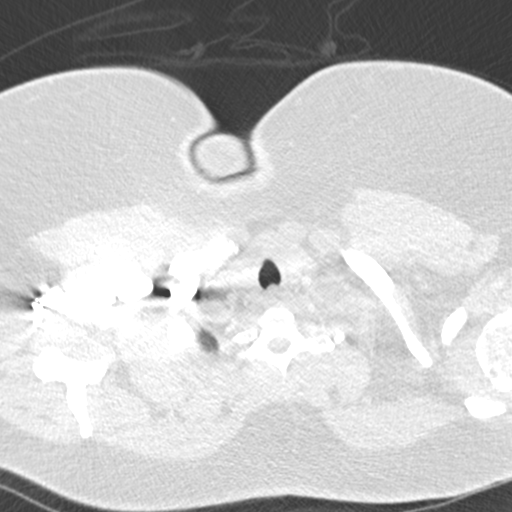

[Series 8: coronal mpr · coronal · 0.53mm/px · 1 of 151 slices shown]
[im 76/151  mediastinal]
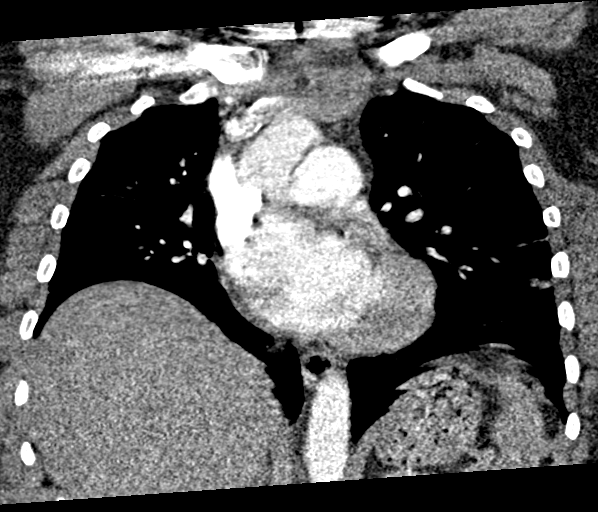

[18 of 36 positions shown; findings below may reference images not displayed]

FINDINGS: Cardiovascular: The heart size is normal. Aortic arch and great
vessel origins are within normal limits.

Pulmonary arterial opacification is satisfactory. No focal filling
defects are present to suggest pulmonary embolus. Pulmonary artery
size is normal. No significant pericardial effusion is present.

Mediastinum/Nodes: No significant mediastinal, hilar, or axillary
adenopathy is present. Subcentimeter axillary lymph nodes are likely
reactive. Esophagus is within normal limits. Thoracic inlet is
normal.

Lungs/Pleura: Bilateral dependent ground-glass opacities likely
reflect atelectasis. Ground-glass attenuation is also present in the
lingula. No nodule or mass lesion is present. There is no
significant pleural effusion or pneumothorax.

Upper Abdomen: Visualized upper abdomen is unremarkable.

Musculoskeletal: Vertebral body heights alignment are maintained. No
focal lytic or blastic lesions are present. Ribs are within normal
limits.

Review of the MIP images confirms the above findings.
IMPRESSION: 1. Diffuse ground-glass attenuation in the dependent lower lobes
bilaterally and lingula. While this likely reflects atelectasis,
infection is not excluded.
2. No pulmonary embolus.

## 2020-10-14 ENCOUNTER — Encounter (HOSPITAL_COMMUNITY): Payer: Self-pay

## 2020-10-14 ENCOUNTER — Ambulatory Visit (INDEPENDENT_AMBULATORY_CARE_PROVIDER_SITE_OTHER): Payer: Self-pay

## 2020-10-14 ENCOUNTER — Ambulatory Visit (HOSPITAL_COMMUNITY)
Admission: EM | Admit: 2020-10-14 | Discharge: 2020-10-14 | Disposition: A | Discharge status: Home / Self Care | Payer: Self-pay | Attending: Emergency Medicine | Admitting: Emergency Medicine

## 2020-10-14 ENCOUNTER — Other Ambulatory Visit: Payer: Self-pay

## 2020-10-14 DIAGNOSIS — M25562 Pain in left knee: Secondary | ICD-10-CM

## 2020-10-14 DIAGNOSIS — M549 Dorsalgia, unspecified: Secondary | ICD-10-CM | POA: Insufficient documentation

## 2020-10-14 DIAGNOSIS — G8929 Other chronic pain: Secondary | ICD-10-CM | POA: Insufficient documentation

## 2020-10-14 DIAGNOSIS — Z87891 Personal history of nicotine dependence: Secondary | ICD-10-CM | POA: Insufficient documentation

## 2020-10-14 DIAGNOSIS — Z79899 Other long term (current) drug therapy: Secondary | ICD-10-CM | POA: Insufficient documentation

## 2020-10-14 DIAGNOSIS — I10 Essential (primary) hypertension: Secondary | ICD-10-CM

## 2020-10-14 DIAGNOSIS — M25561 Pain in right knee: Secondary | ICD-10-CM

## 2020-10-14 DIAGNOSIS — M170 Bilateral primary osteoarthritis of knee: Secondary | ICD-10-CM | POA: Insufficient documentation

## 2020-10-14 DIAGNOSIS — W19XXXA Unspecified fall, initial encounter: Secondary | ICD-10-CM

## 2020-10-14 LAB — BASIC METABOLIC PANEL
Anion gap: 9 (ref 5–15)
BUN: 17 mg/dL (ref 6–20)
CO2: 25 mmol/L (ref 22–32)
Calcium: 9.2 mg/dL (ref 8.9–10.3)
Chloride: 102 mmol/L (ref 98–111)
Creatinine, Ser: 0.94 mg/dL (ref 0.44–1.00)
GFR, Estimated: >60 mL/min (ref >60)
Glucose, Bld: 87 mg/dL (ref 70–99)
Potassium: 4.3 mmol/L (ref 3.5–5.1)
Sodium: 136 mmol/L (ref 135–145)

## 2020-10-14 MED ORDER — GABAPENTIN 300 MG PO CAPS: 300.0000 mg | ORAL_CAPSULE | Freq: Three times a day (TID) | ORAL | 0 refills | Status: AC

## 2020-10-14 MED ORDER — KETOROLAC TROMETHAMINE 10 MG PO TABS
10.0000 mg | ORAL_TABLET | Freq: Four times a day (QID) | ORAL | 0 refills | Status: AC | PRN
Start: 2020-10-14 — End: 2020-10-19

## 2020-10-14 MED ORDER — PREDNISONE 10 MG (21) PO TBPK
ORAL_TABLET | ORAL | 0 refills | Status: DC
Start: 2020-10-14 — End: 2021-03-02

## 2020-10-14 MED ORDER — LIDOCAINE 5 % EX PTCH
1.0000 | MEDICATED_PATCH | CUTANEOUS | 0 refills | Status: AC
Start: 2020-10-14 — End: ?

## 2020-10-14 NOTE — ED Triage Notes (Signed)
Pt presents with knee pain after a fall on Saturday. States she slipped on wet floor and broke her right toe nail and c/o lower back pain.   States she took Aspirin along with Tylenol and felt some relief. Pt states she feels her knee crack and c/o the left knee hurts more.

## 2020-10-14 NOTE — ED Provider Notes (Signed)
HPI  SUBJECTIVE:  Linda Sheppard is a 45 y.o. female who presents with with bilateral knee pain since having two slip and falls 9 days ago.  The first time, she landed on her right side.  She states that she got up, but slipped again, falling forward and landing with full force in both of her bent knees onto a linoleum floor.  She reports bilateral knee pain located primarily medially.  She states that her right knee feels unstable, and has been giving way.  No immediate swelling, distal numbness or tingling.  She states that her right knee and tibia feels like it is "about to break", new crepitus/grinding bilaterally, swelling along the medial knees bilaterally.  She has been taking ibuprofen 800 mg twice daily to 3 times daily, has tried Neurontin, cane, lidocaine patch, cool compresses.  The lidocaine, and movement help.  Ibuprofen used to help, but is no longer working.  Symptoms are worse when she gets up, with walking, standing for prolonged periods of time.  She has been ambulatory during this entire time.  She takes ibuprofen on an ongoing basis because she has chronic knee and back pain.  Blood pressure noted.  She states that she checks her blood pressure at home and it has been ranging in the 140s to 110s since the fall due to pain.  She denies headache, visual changes, slurred speech, arm or leg weakness, chest pain, shortness of breath, syncope, seizures, pain tearing through to her back, anuria, hematuria.  States that she is compliant with her medications and diet.  She has remote history of polysubstance abuse, chronic back pain, bipolar, schizophrenia, hypertension, obesity, recurrent right knee dislocation in childhood, severe arthritis in the right knee.  States that she cannot take Tylenol or aspirin as a "comes back up."  No history of stroke, MI, chronic kidney disease, osteoporosis, liver disease.  Per notes, she has a history of narcotic overdose, but patient denies this, and states  that there was a misunderstanding, and that it was been cleared by her pain management doctor, who she is no longer able to see.  She is struggling with homelessness, but currently has shelter.  Chart review shows that she has had acute renal failure/renal insufficiency in the past, with a creatinine as high as 4.26 on 09/06/2019  Past Medical History:  Diagnosis Date   Anemia    Anxiety    Arthritis    Bipolar 1 disorder ([facility])    Depression    GERD (gastroesophageal reflux disease)    Hypertension    Lipoma    left shoulder   Morbid obesity ([place])    Obesity    Pneumonia    Polysubstance abuse ([place])    Schizophrenia ([place])     Past Surgical History:  Procedure Laterality Date   ABDOMINAL HYSTERECTOMY     CESAREAN SECTION     DENTAL SURGERY     LIPOMA EXCISION Left 07/24/2016   Procedure: EXCISION LIPOMA LEFT SHOULDER;  Surgeon: Greer Pickerel, MD;  Location: [facility];  Service: General;  Laterality: Left;   SUPRACERVICAL ABDOMINAL HYSTERECTOMY N/A 07/31/2014   Procedure: HYSTERECTOMY SUPRACERVICAL ABDOMINAL;  Surgeon: Emily Filbert, MD;  Location: [facility];  Service: Gynecology;  Laterality: N/A;  Requested 07/31/14 @ 10:00a with Nelwyn Salisbury first assist   TUBAL LIGATION      Family History  Problem Relation Age of Onset   Hypertension Mother    Osteoarthritis Mother    Osteoarthritis Father  Social History   Tobacco Use   Smoking status: Former    Packs/day: 0.50    Types: Cigarettes   Smokeless tobacco: Never  Vaping Use   Vaping Use: Never used  Substance Use Topics   Alcohol use: No   Drug use: No    Types: Cocaine    No current facility-administered medications for this encounter.  Current Outpatient Medications:    ketorolac (TORADOL) 10 MG tablet, Take 1 tablet (10 mg total) by mouth every 6 (six) hours as needed for up to 5 days., Disp: 20 tablet, Rfl: 0   lidocaine (LIDODERM) 5 %, Place 1 patch onto the skin daily. Remove & Discard patch within 12 hours or as  directed by MD, Disp: 30 patch, Rfl: 0   predniSONE (STERAPRED UNI-PAK 21 TAB) 10 MG (21) TBPK tablet, Dispense one 6 day pack. Take as directed with food., Disp: 21 tablet, Rfl: 0   amLODipine (NORVASC) 10 MG tablet, Take 1 tablet (10 mg total) by mouth daily after supper., Disp: 30 tablet, Rfl: 5   CLONAZEPAM PO, Take by mouth., Disp: , Rfl:    cyclobenzaprine (FLEXERIL) 10 MG tablet, Take 1 tablet (10 mg total) by mouth 2 (two) times daily as needed for muscle spasms., Disp: 20 tablet, Rfl: 0   DULoxetine (CYMBALTA) 60 MG capsule, Take 1 capsule (60 mg total) by mouth daily. (Patient not taking: Reported on 04/28/2020), Disp: 30 capsule, Rfl: 3   gabapentin (NEURONTIN) 300 MG capsule, Take 1 capsule (300 mg total) by mouth 3 (three) times daily for 10 days., Disp: 30 capsule, Rfl: 0   lisinopril-hydrochlorothiazide (PRINZIDE,ZESTORETIC) 20-25 MG tablet, Take 1 tablet by mouth daily., Disp: 30 tablet, Rfl: 0  Allergies  Allergen Reactions   Ceftin Anaphylaxis     ROS  As noted in HPI.   Physical Exam  BP (!) 186/128 (BP Location: Right Arm) Comment: States she took all her medicine  Pulse 86   Temp 98.2 F (36.8 C) (Oral)   Resp 20   LMP 06/19/2014   SpO2 95%   Constitutional: Well developed, well nourished, appears uncomfortable Eyes:  EOMI, conjunctiva normal bilaterally HENT: Normocephalic, atraumatic,mucus membranes moist Respiratory: Normal inspiratory effort Cardiovascular: Normal rate GI: nondistended skin: No rash, skin intact Musculoskeletal:  Right knee: Positive swelling medial retinaculum.  Positive tibial plateau tenderness, medial joint tenderness. Knee ROM decreased due to pain, Flexion  intact, Patella NT,  Patellar tendon NT, Lateral joint NT, Popliteal region NT, Varus MCL stress testing stable, Valgus LCL stress testing stable, McMurray's testing abnormal, Lachman's negative. Distal NVI with intact baseline sensation / motor / pulse distal to knee.  Unable to  appreciate any effusion.  no erythema, bruising. No increased temperature.  Positive crepitus.    Left knee: Positive swelling at the medial retinaculum, medial joint tenderness.  Mild crepitus.  Pain with varus stress on the MCL, but stable.  Patella tendon tender. Knee ROM decreased due to pain, Flexion  intact, Tenderness entire joint, Patella NT,  Lateral joint NT, tenderness in the medial popliteal region, varus MCL stress testing painful but stable, Valgus LCL stress testing stable, McMurray's testing abnormal, Lachman's negative. Distal NVI with intact baseline sensation / motor / pulse distal to knee.  No effusion. No bruising, erythema. No increased temperature.  Positive crepitus.    Neurologic: Alert & oriented x 3, no focal neuro deficits Psychiatric: Speech and behavior appropriate   ED Course   Medications - No data to display  Orders Placed  This Encounter  Procedures   DG Knee Complete 4 Views Left    Standing Status:   Standing    Number of Occurrences:   1    Order Specific Question:   Reason for Exam (SYMPTOM  OR DIAGNOSIS REQUIRED)    Answer:   trauma r/o fx   DG Knee Complete 4 Views Right    Standing Status:   Standing    Number of Occurrences:   1    Order Specific Question:   Reason for Exam (SYMPTOM  OR DIAGNOSIS REQUIRED)    Answer:   trauma r/o fx   Basic metabolic panel    Standing Status:   Standing    Number of Occurrences:   1    No results found for this or any previous visit (from the past 24 hour(s)). DG Knee Complete 4 Views Left  Result Date: 10/14/2020 CLINICAL DATA:  Fall, knee pain EXAM: LEFT KNEE - COMPLETE 4+ VIEW COMPARISON:  None. FINDINGS: Degenerative changes with joint space narrowing and spurring in all 3 compartments. No joint effusion. No acute bony abnormality. Specifically, no fracture, subluxation, or dislocation. IMPRESSION: Advanced tricompartment degenerative changes. No acute bony abnormality. Electronically Signed   By: Rolm Baptise M.D.   On: 10/14/2020 18:30   DG Knee Complete 4 Views Right  Result Date: 10/14/2020 CLINICAL DATA:  Fall, knee pain EXAM: RIGHT KNEE - COMPLETE 4+ VIEW COMPARISON:  None. FINDINGS: Tricompartment degenerative changes with joint space narrowing and spurring. No joint effusion. No acute bony abnormality. Specifically, no fracture, subluxation, or dislocation. IMPRESSION: Advanced tricompartment degenerative changes. No acute bony abnormality. Electronically Signed   By: Rolm Baptise M.D.   On: 10/14/2020 18:30    Results for orders placed or performed during the hospital encounter of [id number]  Basic metabolic panel  Result Value Ref Range   Sodium 136 135 - 145 mmol/L   Potassium 4.3 3.5 - 5.1 mmol/L   Chloride 102 98 - 111 mmol/L   CO2 25 22 - 32 mmol/L   Glucose, Bld 87 70 - 99 mg/dL   BUN 17 6 - 20 mg/dL   Creatinine, Ser 0.94 0.44 - 1.00 mg/dL   Calcium 9.2 8.9 - 10.3 mg/dL   GFR, Estimated >60 >60 mL/min   Anion gap 9 5 - 15    ED Clinical Impression  1. Acute pain of both knees   2. Fall, initial encounter   3. Essential hypertension      ED Assessment/Plan  1.  Bilateral knee pain.  Exam is somewhat limited by body habitus.  will x-ray both knees to rule out effusion, or acute fracture.  Reviewed imaging independently.  Advanced tricompartmental osteoarthritic changes bilaterally.  No fracture, dislocation, subluxation, effusion.  See radiology report for full details.   2.  Hypertension.  Pt hypertensive today. States BP has been running in this range recently despite taking her medications..  Repeat blood pressure 155/115.  Appears to be in pain. Pt has no historical evidence of end organ damage. Pt denies any CNS type sx such as HA, visual changes, focal paresis, or new onset seizure activity. Pt denies any CV sx such as CP, dyspnea, palpitations, pedal edema, tearing pain radiating to back or abd. Pt denied any renal sx such as anuria or hematuria. Pt denies  illicit drug use, most notably cocaine.  Concerned that her frequent NSAID use is worsening kidney function, which is driving up her blood pressure.  Will check BMP.  Pain  management options are somewhat limited with her history of renal failure, polysubstance abuse.  I offered her intra-articular steroid injections to be done by the sports medicine fellow today, but she declined.  Will send home with capsaicin, lidocaine patch, prednisone taper, and Toradol if her kidney function is normal.  She will need a referral to orthopedics from [facility].  Creatinine has normalized from 1.74 on 7/21.  She may take the Toradol.  Sent patient MyChart message notifying her of normal kidney results and that she may take the Toradol.  Advised her to try some Tylenol as well.  Discussed labs, imaging, MDM, treatment plan, and plan for follow-up with patient. Discussed sn/sx that should prompt return to the ED. patient agrees with plan.   Meds ordered this encounter  Medications   gabapentin (NEURONTIN) 300 MG capsule    Sig: Take 1 capsule (300 mg total) by mouth 3 (three) times daily for 10 days.    Dispense:  30 capsule    Refill:  0   predniSONE (STERAPRED UNI-PAK 21 TAB) 10 MG (21) TBPK tablet    Sig: Dispense one 6 day pack. Take as directed with food.    Dispense:  21 tablet    Refill:  0   ketorolac (TORADOL) 10 MG tablet    Sig: Take 1 tablet (10 mg total) by mouth every 6 (six) hours as needed for up to 5 days.    Dispense:  20 tablet    Refill:  0   lidocaine (LIDODERM) 5 %    Sig: Place 1 patch onto the skin daily. Remove & Discard patch within 12 hours or as directed by MD    Dispense:  30 patch    Refill:  0      *This clinic note was created using [employer]. Therefore, there may be occasional mistakes despite careful proofreading.  ?    Melynda Ripple, MD 10/15/20 [phone]

## 2020-10-14 NOTE — ED Notes (Signed)
No answer in lobby.

## 2020-10-14 NOTE — ED Notes (Signed)
Placed in lab

## 2020-10-14 NOTE — Discharge Instructions (Addendum)
I will call you only if your kidney function comes back abnormal.  I would wait to start the Toradol tomorrow.  If you do not hear from me by tonight or early tomorrow then you can start the Toradol.  I would suggest trying taking 1000 g of Tylenol with Toradol.  Finish the prednisone, you can try some capsaicin cream as well.  Ice.  Lidocaine patches.  I have refilled you Neurontin.  Follow-up with your doctor ASAP

## 2020-11-22 ENCOUNTER — Encounter (HOSPITAL_COMMUNITY): Payer: Self-pay | Admitting: *Deleted

## 2020-11-22 ENCOUNTER — Emergency Department (HOSPITAL_COMMUNITY): Payer: Medicaid Other

## 2020-11-22 ENCOUNTER — Observation Stay (HOSPITAL_COMMUNITY)
Admission: EM | Admit: 2020-11-22 | Discharge: 2020-11-22 | Disposition: A | Discharge status: Home / Self Care | Payer: Self-pay | Attending: Surgery | Admitting: Surgery

## 2020-11-22 ENCOUNTER — Other Ambulatory Visit: Payer: Self-pay

## 2020-11-22 DIAGNOSIS — Z23 Encounter for immunization: Secondary | ICD-10-CM | POA: Diagnosis not present

## 2020-11-22 DIAGNOSIS — F17210 Nicotine dependence, cigarettes, uncomplicated: Secondary | ICD-10-CM | POA: Insufficient documentation

## 2020-11-22 DIAGNOSIS — S299XXA Unspecified injury of thorax, initial encounter: Secondary | ICD-10-CM | POA: Diagnosis present

## 2020-11-22 DIAGNOSIS — Y900 Blood alcohol level of less than 20 mg/100 ml: Secondary | ICD-10-CM | POA: Insufficient documentation

## 2020-11-22 DIAGNOSIS — S70311A Abrasion, right thigh, initial encounter: Secondary | ICD-10-CM | POA: Diagnosis not present

## 2020-11-22 DIAGNOSIS — S2249XA Multiple fractures of ribs, unspecified side, initial encounter for closed fracture: Secondary | ICD-10-CM | POA: Diagnosis present

## 2020-11-22 DIAGNOSIS — S3991XA Unspecified injury of abdomen, initial encounter: Secondary | ICD-10-CM | POA: Diagnosis not present

## 2020-11-22 DIAGNOSIS — Z20822 Contact with and (suspected) exposure to covid-19: Secondary | ICD-10-CM | POA: Diagnosis not present

## 2020-11-22 DIAGNOSIS — S40811A Abrasion of right upper arm, initial encounter: Secondary | ICD-10-CM | POA: Diagnosis not present

## 2020-11-22 DIAGNOSIS — S2243XA Multiple fractures of ribs, bilateral, initial encounter for closed fracture: Secondary | ICD-10-CM | POA: Diagnosis not present

## 2020-11-22 DIAGNOSIS — Z79899 Other long term (current) drug therapy: Secondary | ICD-10-CM | POA: Diagnosis not present

## 2020-11-22 LAB — COMPREHENSIVE METABOLIC PANEL
ALT: 22 U/L (ref 0–44)
AST: 32 U/L (ref 15–41)
Albumin: 4 g/dL (ref 3.5–5.0)
Alkaline Phosphatase: 78 U/L (ref 38–126)
Anion gap: 12 (ref 5–15)
BUN: 14 mg/dL (ref 6–20)
CO2: 23 mmol/L (ref 22–32)
Calcium: 9.4 mg/dL (ref 8.9–10.3)
Chloride: 102 mmol/L (ref 98–111)
Creatinine, Ser: 1.15 mg/dL — ABNORMAL HIGH (ref 0.44–1.00)
GFR, Estimated: >60 mL/min (ref >60)
Glucose, Bld: 99 mg/dL (ref 70–99)
Potassium: 4 mmol/L (ref 3.5–5.1)
Sodium: 137 mmol/L (ref 135–145)
Total Bilirubin: 0.9 mg/dL (ref 0.3–1.2)
Total Protein: 8.1 g/dL (ref 6.5–8.1)

## 2020-11-22 LAB — I-STAT CHEM 8, ED
BUN: 14 mg/dL (ref 6–20)
Calcium, Ion: 1.2 mmol/L (ref 1.15–1.40)
Chloride: 104 mmol/L (ref 98–111)
Creatinine, Ser: 1 mg/dL (ref 0.44–1.00)
Glucose, Bld: 110 mg/dL — ABNORMAL HIGH (ref 70–99)
HCT: 42 % (ref 36.0–46.0)
Hemoglobin: 14.3 g/dL (ref 12.0–15.0)
Potassium: 3.8 mmol/L (ref 3.5–5.1)
Sodium: 139 mmol/L (ref 135–145)
TCO2: 27 mmol/L (ref 22–32)

## 2020-11-22 LAB — CBC
HCT: 38.5 % (ref 36.0–46.0)
Hemoglobin: 12.8 g/dL (ref 12.0–15.0)
MCH: 26.8 pg (ref 26.0–34.0)
MCHC: 33.2 g/dL (ref 30.0–36.0)
MCV: 80.7 fL (ref 80.0–100.0)
Platelets: 287 10*3/uL (ref 150–400)
RBC: 4.77 MIL/uL (ref 3.87–5.11)
RDW: 13.8 % (ref 11.5–15.5)
WBC: 16.3 10*3/uL — ABNORMAL HIGH (ref 4.0–10.5)
nRBC: 0 % (ref 0.0–0.2)

## 2020-11-22 LAB — SAMPLE TO BLOOD BANK

## 2020-11-22 LAB — ETHANOL: Alcohol, Ethyl (B): <10 mg/dL (ref <10)

## 2020-11-22 LAB — HIV ANTIBODY (ROUTINE TESTING W REFLEX): HIV Screen 4th Generation wRfx: NONREACTIVE

## 2020-11-22 LAB — PROTIME-INR
INR: 1.1 (ref 0.8–1.2)
Prothrombin Time: 14.3 seconds (ref 11.4–15.2)

## 2020-11-22 LAB — RESP PANEL BY RT-PCR (FLU A&B, COVID) ARPGX2
Influenza A by PCR: NEGATIVE
Influenza B by PCR: NEGATIVE
SARS Coronavirus 2 by RT PCR: NEGATIVE

## 2020-11-22 LAB — I-STAT BETA HCG BLOOD, ED (MC, WL, AP ONLY): I-stat hCG, quantitative: <5 m[IU]/mL (ref <5)

## 2020-11-22 LAB — LACTIC ACID, PLASMA: Lactic Acid, Venous: 0.9 mmol/L (ref 0.5–1.9)

## 2020-11-22 MED ORDER — BACITRACIN ZINC 500 UNIT/GM EX OINT
TOPICAL_OINTMENT | CUTANEOUS | 1 refills | Status: AC
Start: 2020-11-22 — End: 2021-11-22

## 2020-11-22 MED ORDER — ACETAMINOPHEN 325 MG PO TABS: 650.0000 mg | ORAL_TABLET | ORAL | Status: DC | PRN

## 2020-11-22 MED ORDER — OXYCODONE HCL 5 MG PO TABS
10.0000 mg | ORAL_TABLET | ORAL | Status: DC | PRN
  Administered 2020-11-22: 15 mg via ORAL
  Filled 2020-11-22: qty 3

## 2020-11-22 MED ORDER — TETANUS-DIPHTH-ACELL PERTUSSIS 5-2.5-18.5 LF-MCG/0.5 IM SUSY
0.5000 mL | PREFILLED_SYRINGE | Freq: Once | INTRAMUSCULAR | Status: AC
  Administered 2020-11-22: 0.5 mL via INTRAMUSCULAR
  Filled 2020-11-22: qty 0.5

## 2020-11-22 MED ORDER — METHOCARBAMOL 500 MG PO TABS
1000.0000 mg | ORAL_TABLET | Freq: Three times a day (TID) | ORAL | Status: DC
  Administered 2020-11-22: 1000 mg via ORAL
  Filled 2020-11-22: qty 2

## 2020-11-22 MED ORDER — OXYCODONE HCL 10 MG PO TABS: 10.0000 mg | ORAL_TABLET | ORAL | 0 refills | Status: AC | PRN

## 2020-11-22 MED ORDER — ONDANSETRON 4 MG PO TBDP: 4.0000 mg | ORAL_TABLET | Freq: Four times a day (QID) | ORAL | Status: DC | PRN

## 2020-11-22 MED ORDER — ENOXAPARIN SODIUM 30 MG/0.3ML IJ SOSY: 30.0000 mg | PREFILLED_SYRINGE | Freq: Two times a day (BID) | INTRAMUSCULAR | Status: DC

## 2020-11-22 MED ORDER — KETOROLAC TROMETHAMINE 10 MG PO TABS
10.0000 mg | ORAL_TABLET | Freq: Four times a day (QID) | ORAL | Status: DC
  Administered 2020-11-22: 10 mg via ORAL
  Filled 2020-11-22 (×5): qty 1

## 2020-11-22 MED ORDER — HYDROMORPHONE HCL 1 MG/ML IJ SOLN
1.0000 mg | Freq: Once | INTRAMUSCULAR | Status: AC
  Administered 2020-11-22: 1 mg via INTRAVENOUS
  Filled 2020-11-22: qty 1

## 2020-11-22 MED ORDER — SODIUM CHLORIDE 0.9% FLUSH
3.0000 mL | Freq: Two times a day (BID) | INTRAVENOUS | Status: DC
  Administered 2020-11-22: 3 mL via INTRAVENOUS

## 2020-11-22 MED ORDER — SODIUM CHLORIDE 0.9 % IV SOLN: 250.0000 mL | INTRAVENOUS | Status: DC | PRN

## 2020-11-22 MED ORDER — HYDROMORPHONE HCL 1 MG/ML IJ SOLN
1.0000 mg | Freq: Once | INTRAMUSCULAR | Status: AC
Start: 2020-11-22 — End: 2020-11-22
  Administered 2020-11-22: 1 mg via INTRAVENOUS
  Filled 2020-11-22: qty 1

## 2020-11-22 MED ORDER — ONDANSETRON HCL 4 MG/2ML IJ SOLN: 4.0000 mg | Freq: Four times a day (QID) | INTRAMUSCULAR | Status: DC | PRN

## 2020-11-22 MED ORDER — LIDOCAINE 5 % EX PTCH
1.0000 | MEDICATED_PATCH | CUTANEOUS | Status: DC
  Administered 2020-11-22: 1 via TRANSDERMAL
  Filled 2020-11-22: qty 1

## 2020-11-22 MED ORDER — GABAPENTIN 300 MG PO CAPS
300.0000 mg | ORAL_CAPSULE | Freq: Two times a day (BID) | ORAL | Status: DC
  Administered 2020-11-22: 300 mg via ORAL
  Filled 2020-11-22: qty 1

## 2020-11-22 MED ORDER — ACETAMINOPHEN 500 MG PO TABS: 1000.0000 mg | ORAL_TABLET | Freq: Four times a day (QID) | ORAL | Status: AC

## 2020-11-22 MED ORDER — HYDROMORPHONE HCL 1 MG/ML IJ SOLN
1.0000 mg | INTRAMUSCULAR | Status: DC | PRN
  Filled 2020-11-22: qty 1

## 2020-11-22 MED ORDER — KETOROLAC TROMETHAMINE 30 MG/ML IJ SOLN: 30.0000 mg | Freq: Four times a day (QID) | INTRAMUSCULAR | Status: DC

## 2020-11-22 MED ORDER — KETOROLAC TROMETHAMINE 30 MG/ML IJ SOLN
30.0000 mg | Freq: Three times a day (TID) | INTRAMUSCULAR | Status: DC | PRN
  Administered 2020-11-22: 30 mg via INTRAVENOUS
  Filled 2020-11-22: qty 1

## 2020-11-22 MED ORDER — OXYCODONE HCL 5 MG PO TABS
5.0000 mg | ORAL_TABLET | ORAL | Status: DC | PRN
  Administered 2020-11-22: 10 mg via ORAL
  Filled 2020-11-22: qty 2

## 2020-11-22 MED ORDER — ONDANSETRON HCL 4 MG/2ML IJ SOLN
4.0000 mg | Freq: Once | INTRAMUSCULAR | Status: AC
  Administered 2020-11-22: 4 mg via INTRAVENOUS
  Filled 2020-11-22: qty 2

## 2020-11-22 MED ORDER — HYDROMORPHONE HCL 1 MG/ML IJ SOLN
1.0000 mg | INTRAMUSCULAR | Status: DC | PRN
  Administered 2020-11-22: 1 mg via INTRAVENOUS
  Filled 2020-11-22: qty 1

## 2020-11-22 MED ORDER — METHOCARBAMOL 500 MG PO TABS: 1000.0000 mg | ORAL_TABLET | Freq: Three times a day (TID) | ORAL | 0 refills | Status: AC

## 2020-11-22 MED ORDER — ACETAMINOPHEN 500 MG PO TABS
1000.0000 mg | ORAL_TABLET | Freq: Four times a day (QID) | ORAL | Status: DC
  Administered 2020-11-22 (×2): 1000 mg via ORAL
  Filled 2020-11-22 (×2): qty 2

## 2020-11-22 MED ORDER — SODIUM CHLORIDE 0.9% FLUSH: 3.0000 mL | INTRAVENOUS | Status: DC | PRN
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
## 2020-11-22 NOTE — Plan of Care (Signed)

## 2020-11-22 NOTE — Progress Notes (Signed)
Provided discharge education/instructions to Pt and her boyfriend, all questions and concerns addressed, Pt not in acute distress, cane delivered to room. Due meds given, Pt discharged home with belongings accompanied by boyfriend.

## 2020-11-22 NOTE — ED Notes (Signed)
Family at bedside. 
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
## 2020-11-22 NOTE — ED Notes (Signed)
Transported to [place] with [facility]

## 2020-11-25 ENCOUNTER — Encounter (HOSPITAL_COMMUNITY): Payer: Self-pay

## 2020-11-27 ENCOUNTER — Emergency Department (HOSPITAL_COMMUNITY): Payer: Medicaid Other

## 2020-11-27 ENCOUNTER — Other Ambulatory Visit: Payer: Self-pay

## 2020-11-27 ENCOUNTER — Encounter (HOSPITAL_COMMUNITY): Payer: Self-pay | Admitting: *Deleted

## 2020-11-27 ENCOUNTER — Emergency Department (HOSPITAL_COMMUNITY)
Admission: EM | Admit: 2020-11-27 | Discharge: 2020-11-27 | Disposition: A | Discharge status: Home / Self Care | Payer: Medicaid Other | Attending: Emergency Medicine | Admitting: Emergency Medicine

## 2020-11-27 DIAGNOSIS — S50811A Abrasion of right forearm, initial encounter: Secondary | ICD-10-CM | POA: Insufficient documentation

## 2020-11-27 DIAGNOSIS — S2243XD Multiple fractures of ribs, bilateral, subsequent encounter for fracture with routine healing: Secondary | ICD-10-CM | POA: Insufficient documentation

## 2020-11-27 DIAGNOSIS — S30810A Abrasion of lower back and pelvis, initial encounter: Secondary | ICD-10-CM | POA: Insufficient documentation

## 2020-11-27 DIAGNOSIS — I10 Essential (primary) hypertension: Secondary | ICD-10-CM | POA: Diagnosis not present

## 2020-11-27 DIAGNOSIS — Y92410 Unspecified street and highway as the place of occurrence of the external cause: Secondary | ICD-10-CM | POA: Diagnosis not present

## 2020-11-27 DIAGNOSIS — F17210 Nicotine dependence, cigarettes, uncomplicated: Secondary | ICD-10-CM | POA: Diagnosis not present

## 2020-11-27 DIAGNOSIS — Z79899 Other long term (current) drug therapy: Secondary | ICD-10-CM | POA: Insufficient documentation

## 2020-11-27 DIAGNOSIS — S50812A Abrasion of left forearm, initial encounter: Secondary | ICD-10-CM | POA: Insufficient documentation

## 2020-11-27 DIAGNOSIS — T148XXA Other injury of unspecified body region, initial encounter: Secondary | ICD-10-CM

## 2020-11-27 HISTORY — DX: Sleep apnea, unspecified: G47.30

## 2020-11-27 LAB — CBC WITH DIFFERENTIAL/PLATELET
Abs Immature Granulocytes: 0.2 10*3/uL — ABNORMAL HIGH (ref 0.00–0.07)
Basophils Absolute: 0.1 10*3/uL (ref 0.0–0.1)
Basophils Relative: 1 %
Eosinophils Absolute: 0.1 10*3/uL (ref 0.0–0.5)
Eosinophils Relative: 0 %
HCT: 35.4 % — ABNORMAL LOW (ref 36.0–46.0)
Hemoglobin: 11.8 g/dL — ABNORMAL LOW (ref 12.0–15.0)
Immature Granulocytes: 1 %
Lymphocytes Relative: 20 %
Lymphs Abs: 2.8 10*3/uL (ref 0.7–4.0)
MCH: 26.6 pg (ref 26.0–34.0)
MCHC: 33.3 g/dL (ref 30.0–36.0)
MCV: 79.9 fL — ABNORMAL LOW (ref 80.0–100.0)
Monocytes Absolute: 1.7 10*3/uL — ABNORMAL HIGH (ref 0.1–1.0)
Monocytes Relative: 12 %
Neutro Abs: 9.1 10*3/uL — ABNORMAL HIGH (ref 1.7–7.7)
Neutrophils Relative %: 66 %
Platelets: 290 10*3/uL (ref 150–400)
RBC: 4.43 MIL/uL (ref 3.87–5.11)
RDW: 13.4 % (ref 11.5–15.5)
WBC: 14 10*3/uL — ABNORMAL HIGH (ref 4.0–10.5)
nRBC: 0.1 % (ref 0.0–0.2)

## 2020-11-27 LAB — I-STAT VENOUS BLOOD GAS, ED
Acid-Base Excess: 3 mmol/L — ABNORMAL HIGH (ref 0.0–2.0)
Bicarbonate: 29.8 mmol/L — ABNORMAL HIGH (ref 20.0–28.0)
Calcium, Ion: 1.15 mmol/L (ref 1.15–1.40)
HCT: 36 % (ref 36.0–46.0)
Hemoglobin: 12.2 g/dL (ref 12.0–15.0)
O2 Saturation: 99 %
Potassium: 3.8 mmol/L (ref 3.5–5.1)
Sodium: 140 mmol/L (ref 135–145)
TCO2: 32 mmol/L (ref 22–32)
pCO2, Ven: 56.6 mmHg (ref 44.0–60.0)
pH, Ven: 7.33 (ref 7.250–7.430)
pO2, Ven: 157 mmHg — ABNORMAL HIGH (ref 32.0–45.0)

## 2020-11-27 LAB — COMPREHENSIVE METABOLIC PANEL
ALT: 19 U/L (ref 0–44)
AST: 22 U/L (ref 15–41)
Albumin: 3.5 g/dL (ref 3.5–5.0)
Alkaline Phosphatase: 80 U/L (ref 38–126)
Anion gap: 12 (ref 5–15)
BUN: 14 mg/dL (ref 6–20)
CO2: 25 mmol/L (ref 22–32)
Calcium: 9.1 mg/dL (ref 8.9–10.3)
Chloride: 100 mmol/L (ref 98–111)
Creatinine, Ser: 1.14 mg/dL — ABNORMAL HIGH (ref 0.44–1.00)
GFR, Estimated: >60 mL/min (ref >60)
Glucose, Bld: 103 mg/dL — ABNORMAL HIGH (ref 70–99)
Potassium: 3.9 mmol/L (ref 3.5–5.1)
Sodium: 137 mmol/L (ref 135–145)
Total Bilirubin: 0.6 mg/dL (ref 0.3–1.2)
Total Protein: 7.8 g/dL (ref 6.5–8.1)

## 2020-11-27 LAB — I-STAT BETA HCG BLOOD, ED (MC, WL, AP ONLY): I-stat hCG, quantitative: <5 m[IU]/mL (ref <5)

## 2020-11-27 LAB — TROPONIN I (HIGH SENSITIVITY): Troponin I (High Sensitivity): 13 ng/L (ref <18)

## 2020-11-27 LAB — CBG MONITORING, ED: Glucose-Capillary: 100 mg/dL — ABNORMAL HIGH (ref 70–99)

## 2020-11-27 LAB — ETHANOL: Alcohol, Ethyl (B): <10 mg/dL (ref <10)

## 2020-11-27 NOTE — ED Notes (Signed)
Provider at bedside
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
## 2020-11-27 NOTE — ED Notes (Signed)
Pt in room now . 

## 2020-11-27 NOTE — ED Notes (Signed)
Not in room at this time 

## 2020-11-27 NOTE — Discharge Instructions (Signed)
Return for any problem.  Follow-up with your regular care providers as instructed.
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
## 2020-11-27 NOTE — ED Notes (Signed)
Pt unable to go to the restroom at this time. Requesting something to drink per provider pt is unable to have anything at this time.
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
## 2020-11-27 NOTE — ED Notes (Signed)
Pt provided water at this time.

## 2020-11-27 NOTE — ED Notes (Signed)
Resp called refcerence abg needed

## 2021-01-13 ENCOUNTER — Encounter (HOSPITAL_COMMUNITY): Payer: Self-pay | Admitting: Emergency Medicine

## 2021-01-13 ENCOUNTER — Emergency Department (HOSPITAL_COMMUNITY)
Admission: EM | Admit: 2021-01-13 | Discharge: 2021-01-14 | Disposition: A | Discharge status: Home / Self Care | Payer: Self-pay | Attending: Emergency Medicine | Admitting: Emergency Medicine

## 2021-01-13 ENCOUNTER — Emergency Department (HOSPITAL_COMMUNITY): Payer: Self-pay

## 2021-01-13 DIAGNOSIS — Y900 Blood alcohol level of less than 20 mg/100 ml: Secondary | ICD-10-CM | POA: Diagnosis not present

## 2021-01-13 DIAGNOSIS — I10 Essential (primary) hypertension: Secondary | ICD-10-CM | POA: Diagnosis not present

## 2021-01-13 DIAGNOSIS — F17210 Nicotine dependence, cigarettes, uncomplicated: Secondary | ICD-10-CM | POA: Insufficient documentation

## 2021-01-13 DIAGNOSIS — Z79899 Other long term (current) drug therapy: Secondary | ICD-10-CM | POA: Diagnosis not present

## 2021-01-13 DIAGNOSIS — R110 Nausea: Secondary | ICD-10-CM | POA: Insufficient documentation

## 2021-01-13 LAB — COMPREHENSIVE METABOLIC PANEL
ALT: 19 U/L (ref 0–44)
AST: 28 U/L (ref 15–41)
Albumin: 3.8 g/dL (ref 3.5–5.0)
Alkaline Phosphatase: 69 U/L (ref 38–126)
Anion gap: 10 (ref 5–15)
BUN: 12 mg/dL (ref 6–20)
CO2: 23 mmol/L (ref 22–32)
Calcium: 9 mg/dL (ref 8.9–10.3)
Chloride: 105 mmol/L (ref 98–111)
Creatinine, Ser: 1.17 mg/dL — ABNORMAL HIGH (ref 0.44–1.00)
GFR, Estimated: 59 mL/min — ABNORMAL LOW (ref >60)
Glucose, Bld: 143 mg/dL — ABNORMAL HIGH (ref 70–99)
Potassium: 3.1 mmol/L — ABNORMAL LOW (ref 3.5–5.1)
Sodium: 138 mmol/L (ref 135–145)
Total Bilirubin: 0.9 mg/dL (ref 0.3–1.2)
Total Protein: 7.8 g/dL (ref 6.5–8.1)

## 2021-01-13 LAB — RAPID URINE DRUG SCREEN, HOSP PERFORMED
Amphetamines: NOT DETECTED
Barbiturates: NOT DETECTED
Benzodiazepines: POSITIVE — AB
Cocaine: POSITIVE — AB
Opiates: NOT DETECTED
Tetrahydrocannabinol: POSITIVE — AB

## 2021-01-13 LAB — I-STAT CHEM 8, ED
BUN: 12 mg/dL (ref 6–20)
Calcium, Ion: 1.09 mmol/L — ABNORMAL LOW (ref 1.15–1.40)
Chloride: 106 mmol/L (ref 98–111)
Creatinine, Ser: 1.1 mg/dL — ABNORMAL HIGH (ref 0.44–1.00)
Glucose, Bld: 144 mg/dL — ABNORMAL HIGH (ref 70–99)
HCT: 40 % (ref 36.0–46.0)
Hemoglobin: 13.6 g/dL (ref 12.0–15.0)
Potassium: 3.1 mmol/L — ABNORMAL LOW (ref 3.5–5.1)
Sodium: 140 mmol/L (ref 135–145)
TCO2: 23 mmol/L (ref 22–32)

## 2021-01-13 LAB — I-STAT BETA HCG BLOOD, ED (MC, WL, AP ONLY): I-stat hCG, quantitative: <5 m[IU]/mL (ref <5)

## 2021-01-13 LAB — CBC
HCT: 36.2 % (ref 36.0–46.0)
Hemoglobin: 11.8 g/dL — ABNORMAL LOW (ref 12.0–15.0)
MCH: 26.3 pg (ref 26.0–34.0)
MCHC: 32.6 g/dL (ref 30.0–36.0)
MCV: 80.6 fL (ref 80.0–100.0)
Platelets: 250 10*3/uL (ref 150–400)
RBC: 4.49 MIL/uL (ref 3.87–5.11)
RDW: 14.6 % (ref 11.5–15.5)
WBC: 10.8 10*3/uL — ABNORMAL HIGH (ref 4.0–10.5)
nRBC: 0 % (ref 0.0–0.2)

## 2021-01-13 LAB — ETHANOL: Alcohol, Ethyl (B): <10 mg/dL (ref <10)

## 2021-01-13 MED ORDER — ONDANSETRON 4 MG PO TBDP
8.0000 mg | ORAL_TABLET | Freq: Once | ORAL | Status: AC
  Administered 2021-01-13: 8 mg via ORAL
  Filled 2021-01-13: qty 2

## 2021-01-13 MED ORDER — POTASSIUM CHLORIDE CRYS ER 20 MEQ PO TBCR
40.0000 meq | EXTENDED_RELEASE_TABLET | Freq: Once | ORAL | Status: AC
  Administered 2021-01-14: 40 meq via ORAL
  Filled 2021-01-13: qty 2
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
## 2021-01-13 NOTE — ED Notes (Signed)
Pt to Xray.
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
## 2021-01-13 NOTE — Discharge Instructions (Addendum)
It was our pleasure to provide your ER care today - we hope that you feel better.  Take acetaminophen as need. Follow up with primary care doctor in the coming week - also have blood pressure rechecked then as it is high tonight.   Return to ER if worse, new symptoms, new/severe pain, persistent vomiting, or other concern.

## 2021-01-13 NOTE — ED Notes (Signed)
PT up with stanby assist to bedside commode.
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
## 2021-01-14 NOTE — ED Notes (Signed)
Pt ao x 4.  Able to tolerate po and ambulate without difficulty.  Given [employer] and [employer] called.

## 2021-01-14 NOTE — Progress Notes (Signed)
Orthopedic Tech Progress Note Patient Details:  Linda Sheppard May 20, 1975 [id number]  Patient ID: Linda Sheppard, female   DOB: [birth year], 45 y.o.   MRN: [id number] Attended trauma page Karolee Stamps 01/14/2021, 6:17 AM

## 2021-02-11 ENCOUNTER — Other Ambulatory Visit: Payer: Self-pay | Admitting: Nurse Practitioner

## 2021-02-11 DIAGNOSIS — M5136 Other intervertebral disc degeneration, lumbar region: Secondary | ICD-10-CM

## 2021-02-11 DIAGNOSIS — G894 Chronic pain syndrome: Secondary | ICD-10-CM

## 2021-02-11 DIAGNOSIS — M170 Bilateral primary osteoarthritis of knee: Secondary | ICD-10-CM

## 2021-03-02 ENCOUNTER — Emergency Department (HOSPITAL_COMMUNITY)
Admission: EM | Admit: 2021-03-02 | Discharge: 2021-03-02 | Disposition: A | Discharge status: Home / Self Care | Payer: Medicaid Other | Attending: Emergency Medicine | Admitting: Emergency Medicine

## 2021-03-02 ENCOUNTER — Other Ambulatory Visit: Payer: Self-pay

## 2021-03-02 ENCOUNTER — Encounter (HOSPITAL_COMMUNITY): Payer: Self-pay | Admitting: Emergency Medicine

## 2021-03-02 DIAGNOSIS — F17210 Nicotine dependence, cigarettes, uncomplicated: Secondary | ICD-10-CM | POA: Insufficient documentation

## 2021-03-02 DIAGNOSIS — I10 Essential (primary) hypertension: Secondary | ICD-10-CM | POA: Insufficient documentation

## 2021-03-02 DIAGNOSIS — Z79899 Other long term (current) drug therapy: Secondary | ICD-10-CM | POA: Insufficient documentation

## 2021-03-02 DIAGNOSIS — M5431 Sciatica, right side: Secondary | ICD-10-CM | POA: Insufficient documentation

## 2021-03-02 LAB — URINALYSIS, ROUTINE W REFLEX MICROSCOPIC
Bilirubin Urine: NEGATIVE
Glucose, UA: NEGATIVE mg/dL
Hgb urine dipstick: NEGATIVE
Ketones, ur: NEGATIVE mg/dL
Leukocytes,Ua: NEGATIVE
Nitrite: NEGATIVE
Protein, ur: NEGATIVE mg/dL
Specific Gravity, Urine: 1.015 (ref 1.005–1.030)
pH: 6 (ref 5.0–8.0)

## 2021-03-02 MED ORDER — PREDNISONE 10 MG (21) PO TBPK: ORAL_TABLET | ORAL | 0 refills | Status: AC

## 2021-03-02 MED ORDER — DIAZEPAM 5 MG PO TABS
10.0000 mg | ORAL_TABLET | Freq: Once | ORAL | Status: AC
  Administered 2021-03-02: 11:00:00 10 mg via ORAL
  Filled 2021-03-02: qty 2

## 2021-03-02 MED ORDER — KETOROLAC TROMETHAMINE 60 MG/2ML IM SOLN
60.0000 mg | Freq: Once | INTRAMUSCULAR | Status: AC
  Administered 2021-03-02: 11:00:00 60 mg via INTRAMUSCULAR
  Filled 2021-03-02: qty 2

## 2021-03-02 NOTE — Discharge Instructions (Addendum)
Your urinalysis did not show infection in the ED.  Follow-up with your primary care provider as needed for further management of your symptoms.  Attached is information for the orthopedist, call and inform them of your ED visit today. Continue taking the prescription medications prescribed to you by your primary care provider.  You may use ice/heat to the affected area for 15 minutes at a time.  Ensure to place a barrier between your skin and the ice.  Return to the ED if you are experiencing increasing/worsening inability to walk, fever, worsening symptoms.
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
## 2021-03-02 NOTE — ED Provider Notes (Signed)
[facility] DEPT Provider Note   CSN: [id number] Arrival date & time: 03/02/21  3818     History No chief complaint on file.   Linda Sheppard is a 45 y.o. female with a past medical history of polysubstance abuse, HTN, who presents to the ED complaining of right sided sciatica x 1.5 months.  Patient notes her symptoms worsened 6 days ago.  She has a shooting sensation down her posterior right thigh to her buttock. Patient has associated urinary incontinence (at night). Her urinary incontinence occurs when she is getting up to go urinate already. Denies recent injury, trauma, or heavy lifting. She has tried her Rx medications with no relief of her symptoms. Denies abdominal pain, nausea, vomiting, fever, chills, dysuria, hematuria, vaginal bleeding, vaginal discharge. Pt with a history of chronic back pain.   The history is provided by the patient. No language interpreter was used.      Past Medical History:  Diagnosis Date   Anemia    Anxiety    Arthritis    Bipolar 1 disorder (HCC)    Depression    GERD (gastroesophageal reflux disease)    Hypertension    Lipoma    left shoulder   Morbid obesity ([place])    Obesity    Pneumonia    Polysubstance abuse ([place])    Schizophrenia ([place])    Sleep apnea     Patient Active Problem List   Diagnosis Date Noted   Rib fractures 11/22/2020   Substance use disorder 09/07/2019   Narcotic overdose ([place]) 09/06/2019   Lipoma of left shoulder 07/24/2016   Wound infection after surgery 08/11/2014   Post-operative state 07/31/2014   Diverticulosis 07/02/2014   Intramural leiomyoma of uterus 07/02/2014   Pelvic pain in female 07/02/2014   Morbid obesity ([place]) 07/02/2014   Diverticulosis of large intestine without hemorrhage 07/02/2014   Fibroid uterus, with degeneration 05/25/2014   Alcohol abuse 10/05/2012   Cocaine abuse ([place]) 10/05/2012   Chronic back pain greater than 3 months duration 07/16/2012    Major depressive disorder, recurrent episode, moderate ([place]) 06/20/2012    Past Surgical History:  Procedure Laterality Date   ABDOMINAL HYSTERECTOMY     CESAREAN SECTION     DENTAL SURGERY     LIPOMA EXCISION Left 07/24/2016   Procedure: EXCISION LIPOMA LEFT SHOULDER;  Surgeon: Greer Pickerel, MD;  Location: [facility];  Service: General;  Laterality: Left;   SUPRACERVICAL ABDOMINAL HYSTERECTOMY N/A 07/31/2014   Procedure: HYSTERECTOMY SUPRACERVICAL ABDOMINAL;  Surgeon: Emily Filbert, MD;  Location: [facility];  Service: Gynecology;  Laterality: N/A;  Requested 07/31/14 @ 10:00a with Nelwyn Salisbury first assist   TUBAL LIGATION       OB History     Gravida  5   Para  3   Term  1   Preterm  2   AB  1   Living  2      SAB  0   IAB  0   Ectopic  0   Multiple      Live Births  3        Obstetric Comments  1- SIDS 1- Stillbirth 2 living children         Family History  Problem Relation Age of Onset   Hypertension Mother    Osteoarthritis Mother    Osteoarthritis Father     Social History   Tobacco Use   Smoking status: Every Day    Types: Cigarettes   Smokeless  tobacco: Never  Vaping Use   Vaping Use: Never used  Substance Use Topics   Alcohol use: Not Currently   Drug use: Not Currently    Types: Cocaine    Comment: last used 2015    Home Medications Prior to Admission medications   Medication Sig Start Date End Date Taking? Authorizing Provider  amLODipine (NORVASC) 10 MG tablet Take 1 tablet (10 mg total) by mouth daily after supper. 05/13/17   Dorena Dew, FNP  bacitracin ointment Apply to affected area daily 11/22/20 11/22/21  Winferd Humphrey, PA-C  busPIRone (BUSPAR) 10 MG tablet Take 10 mg by mouth 3 (three) times daily. 09/11/20   [provider]  clonazePAM (KLONOPIN) 1 MG tablet Take 1 mg by mouth 3 (three) times daily as needed for anxiety. 11/20/20   [provider]  CLONAZEPAM PO Take by mouth.    [provider]  cloNIDine  (CATAPRES) 0.1 MG tablet Take 0.1 mg by mouth 2 (two) times daily. 07/24/20   [provider]  cyclobenzaprine (FLEXERIL) 10 MG tablet Take 1 tablet (10 mg total) by mouth 2 (two) times daily as needed for muscle spasms. 04/28/20   Hughie Closs, PA-C  cyclobenzaprine (FLEXERIL) 10 MG tablet Take 10 mg by mouth daily. 08/20/20   [provider]  DULoxetine (CYMBALTA) 60 MG capsule Take 1 capsule (60 mg total) by mouth daily. Patient not taking: Reported on 04/28/2020 09/07/19   Laurin Coder, MD  gabapentin (NEURONTIN) 300 MG capsule Take 1 capsule (300 mg total) by mouth 3 (three) times daily for 10 days. 10/14/20 10/24/20  Melynda Ripple, MD  gabapentin (NEURONTIN) 300 MG capsule Take 300 mg by mouth 3 (three) times daily. 07/27/20   [provider]  hydrOXYzine (ATARAX/VISTARIL) 25 MG tablet Take 25 mg by mouth 3 (three) times daily. 06/24/20   [provider]  ketorolac (TORADOL) 10 MG tablet Take 10 mg by mouth every 6 (six) hours as needed. 10/14/20   [provider]  lidocaine (LIDODERM) 5 % Place 1 patch onto the skin daily. Remove & Discard patch within 12 hours or as directed by MD 10/14/20   Melynda Ripple, MD  lisinopril-hydrochlorothiazide (PRINZIDE,ZESTORETIC) 20-25 MG tablet Take 1 tablet by mouth daily. 03/11/17   Fransico Meadow, PA-C  meloxicam (MOBIC) 7.5 MG tablet Take 7.5 mg by mouth daily. 06/18/20   [provider]  predniSONE (STERAPRED UNI-PAK 21 TAB) 10 MG (21) TBPK tablet Dispense one 6 day pack. Take as directed with food. 03/02/21   Denissa Cozart A, PA-C  dicyclomine (BENTYL) 20 MG tablet Take 1 tablet (20 mg total) by mouth 2 (two) times daily. Patient not taking: Reported on 06/24/2017 09/14/16 11/09/18  Lacretia Leigh, MD  famotidine (PEPCID) 20 MG tablet Take 1 tablet (20 mg total) by mouth 2 (two) times daily. Patient not taking: Reported on 06/24/2017 09/14/16 11/09/18  Lacretia Leigh, MD  omeprazole (PRILOSEC) 20 MG  capsule Take 1 capsule (20 mg total) by mouth daily. Patient not taking: Reported on 06/24/2017 07/19/16 11/09/18  Okey Regal, PA-C    Allergies    Ceftin and Ceftin [cefuroxime]  Review of Systems   Review of Systems  Constitutional:  Negative for chills and fever.  Respiratory:  Negative for cough and shortness of breath.   Cardiovascular:  Negative for chest pain.  Gastrointestinal:  Negative for abdominal pain, nausea and vomiting.  Genitourinary:  Positive for frequency. Negative for dysuria, hematuria, vaginal bleeding and vaginal discharge.  Musculoskeletal:  Positive for back pain. Negative for gait problem.  Skin:  Negative for color change, rash and wound.  All other systems reviewed and are negative.  Physical Exam Updated Vital Signs BP (!) 173/108    Pulse 78    Temp 98.8 F (37.1 C) (Oral)    Resp 18    Ht 5\' 6"  (1.676 m)    Wt (!) 155 kg    LMP 06/19/2014    SpO2 97%    BMI 55.15 kg/m   Physical Exam Vitals and nursing note reviewed.  Constitutional:      General: She is not in acute distress.    Appearance: She is not diaphoretic.  HENT:     Head: Normocephalic and atraumatic.     Mouth/Throat:     Pharynx: No oropharyngeal exudate.  Eyes:     General: No scleral icterus.    Conjunctiva/sclera: Conjunctivae normal.  Cardiovascular:     Rate and Rhythm: Normal rate and regular rhythm.     Pulses: Normal pulses.     Heart sounds: Normal heart sounds.  Pulmonary:     Effort: Pulmonary effort is normal. No respiratory distress.     Breath sounds: Normal breath sounds. No wheezing.  Abdominal:     General: Bowel sounds are normal.     Palpations: Abdomen is soft. There is no mass.     Tenderness: There is no abdominal tenderness. There is no guarding or rebound.  Musculoskeletal:     Cervical back: Normal, normal range of motion and neck supple. No spasms, tenderness or bony tenderness. Normal range of motion.     Thoracic back: Normal. No spasms,  tenderness or bony tenderness. Normal range of motion.     Lumbar back: No tenderness or bony tenderness. Normal range of motion. Positive right straight leg raise test. Negative left straight leg raise test.     Comments: No C, T, L, S spinal tenderness to palpation.  No overlying skin changes.  No lumbar musculature tenderness to palpation on exam.  Positive straight leg raise on the right.  Negative straight leg raise for the left.  DP and PT pulses intact bilaterally.  Decreased strength secondary to pain of lower extremities.  Sensation intact to bilateral lower extremities.  Strength and sensation intact to bilateral upper extremities.  able to ambulate without assistance or difficulty.  Skin:    General: Skin is warm and dry.  Neurological:     Mental Status: She is alert.  Psychiatric:        Behavior: Behavior normal.    ED Results / Procedures / Treatments   Labs (all labs ordered are listed, but only abnormal results are displayed) Labs Reviewed  URINALYSIS, ROUTINE W REFLEX MICROSCOPIC    EKG None  Radiology No results found.  Procedures Procedures   Medications Ordered in ED Medications  diazepam (VALIUM) tablet 10 mg (10 mg Oral Given 03/02/21 1039)  ketorolac (TORADOL) injection 60 mg (60 mg Intramuscular Given 03/02/21 1038)    ED Course  I have reviewed the triage vital signs and the nursing notes.  Pertinent labs & imaging results that were available during my care of the patient were reviewed by me and considered in my medical decision making (see chart for details).  Clinical Course as of 03/02/21 1438  Sun Mar 02, 2021  1029 Case discussed with attending who agrees with treatment plan. [SB]  1155 Visualized patient ambulating in the hallway without assistance or difficulty. [SB]  1234 Patient reevaluated and noted she has continued pain.  Further discussion with patient and patient notes that she has adequate follow-up with her primary care provider who  has been managing her sciatica in the outpatient setting.  She has been managed with gabapentin that was switched to Lyrica with relief of her symptoms.  Also placed on a muscle relaxer. Patient without insurance and unable to afford pain management visits, specialty visits or further imaging work-up that was suggested by her primary care provider. [SB]  7989 Patient sitting comfortably watching TV on stretcher prior to discharge.  Notified patient of discharge treatment plan.  Patient appears safe for discharge. [SB]    Clinical Course User Index [SB] Kodee Drury A, PA-C   MDM Rules/Calculators/A&P                          Patient with right lumbar back pain with radiation down posteriorly to right upper thigh.  She has a history of similar and is being treated adequately by her primary care provider with gabapentin.  Patient was also instructed by her primary care provider to follow-up with pain management and Ortho for possible injections however patient without insurance at this time and having difficulty. Patient tried prescription medications without relief of her symptoms. Pt with urinary incontinence x 4 episodes this week, however, no bowel incontinence. No neurological deficits and normal neuro exam.  Patient is ambulatory without assistance or difficulty.  Full active range of motion of back. No concern for cauda equina.  No fever, night sweats, weight loss, h/o cancer, IVDA, no recent procedure to back. No urinary symptoms suggestive of UTI.  No concerning findings on exams. No imaging indicated at this time due to absence of red flag symptoms.  Urinalysis unremarkable in the ED.  Patient given Toradol and Valium in the ED with minimal improvement of symptoms. Pt case discussed with attending who agrees with treatment plan.   Discussed with patient multiple times throughout ED visit that there are no red flags concerning for emergent causes at this time.  Patient visualized ambulating in the  ED without assistance or difficulty.  Discussed multiple times with patient in the ED that she can follow-up with her primary care provider and continue to take her prescription medications as prescribed by her primary care provider which are appropriate for her current symptoms.    Pt has prescription gabapentin and mobic, discussed with patient to continue those medications. Will send prednisone taper to aid with symptoms. Stressed importance of follow-up with primary care provider for future management.  Patient agreeable. Patient acknowledges and verbalizes understanding.  Supportive care and return precaution discussed. Appears safe for discharge at this time. Follow up as indicated in discharge paperwork.  Final Clinical Impression(s) / ED Diagnoses Final diagnoses:  Sciatica of right side    Rx / DC Orders ED Discharge Orders          Ordered    predniSONE (STERAPRED UNI-PAK 21 TAB) 10 MG (21) TBPK tablet        03/02/21 1321             Antwoin Lackey A, PA-C 03/02/21 1438    Lacretia Leigh, MD 03/03/21 [phone]

## 2021-05-08 ENCOUNTER — Other Ambulatory Visit: Payer: Self-pay

## 2021-05-08 ENCOUNTER — Emergency Department (HOSPITAL_COMMUNITY)
Admission: EM | Admit: 2021-05-08 | Discharge: 2021-05-08 | Disposition: A | Discharge status: Home / Self Care | Payer: Medicaid Other | Attending: Emergency Medicine | Admitting: Emergency Medicine

## 2021-05-08 DIAGNOSIS — N390 Urinary tract infection, site not specified: Secondary | ICD-10-CM | POA: Insufficient documentation

## 2021-05-08 LAB — COMPREHENSIVE METABOLIC PANEL
ALT: 24 U/L (ref 0–44)
AST: 18 U/L (ref 15–41)
Albumin: 3.5 g/dL (ref 3.5–5.0)
Alkaline Phosphatase: 89 U/L (ref 38–126)
Anion gap: 10 (ref 5–15)
BUN: 6 mg/dL (ref 6–20)
CO2: 22 mmol/L (ref 22–32)
Calcium: 9.1 mg/dL (ref 8.9–10.3)
Chloride: 104 mmol/L (ref 98–111)
Creatinine, Ser: 0.72 mg/dL (ref 0.44–1.00)
GFR, Estimated: >60 mL/min (ref >60)
Glucose, Bld: 96 mg/dL (ref 70–99)
Potassium: 4 mmol/L (ref 3.5–5.1)
Sodium: 136 mmol/L (ref 135–145)
Total Bilirubin: 0.6 mg/dL (ref 0.3–1.2)
Total Protein: 7.6 g/dL (ref 6.5–8.1)

## 2021-05-08 LAB — CBC
HCT: 35.1 % — ABNORMAL LOW (ref 36.0–46.0)
Hemoglobin: 11.9 g/dL — ABNORMAL LOW (ref 12.0–15.0)
MCH: 26.3 pg (ref 26.0–34.0)
MCHC: 33.9 g/dL (ref 30.0–36.0)
MCV: 77.5 fL — ABNORMAL LOW (ref 80.0–100.0)
Platelets: 390 10*3/uL (ref 150–400)
RBC: 4.53 MIL/uL (ref 3.87–5.11)
RDW: 13.4 % (ref 11.5–15.5)
WBC: 11.9 10*3/uL — ABNORMAL HIGH (ref 4.0–10.5)
nRBC: 0.3 % — ABNORMAL HIGH (ref 0.0–0.2)

## 2021-05-08 LAB — URINALYSIS, ROUTINE W REFLEX MICROSCOPIC
Bilirubin Urine: NEGATIVE
Glucose, UA: NEGATIVE mg/dL
Ketones, ur: NEGATIVE mg/dL
Nitrite: NEGATIVE
Protein, ur: 30 mg/dL — AB
RBC / HPF: >50 RBC/hpf — ABNORMAL HIGH (ref 0–5)
Specific Gravity, Urine: 1.014 (ref 1.005–1.030)
pH: 5 (ref 5.0–8.0)

## 2021-05-08 LAB — LIPASE, BLOOD: Lipase: 26 U/L (ref 11–51)

## 2021-05-08 LAB — PREGNANCY, URINE: Preg Test, Ur: NEGATIVE

## 2021-05-08 MED ORDER — NITROFURANTOIN MONOHYD MACRO 100 MG PO CAPS: 100.0000 mg | ORAL_CAPSULE | Freq: Two times a day (BID) | ORAL | 0 refills | Status: AC

## 2021-05-08 MED ORDER — PHENAZOPYRIDINE HCL 200 MG PO TABS: 200.0000 mg | ORAL_TABLET | Freq: Three times a day (TID) | ORAL | 0 refills | Status: AC

## 2021-05-08 NOTE — ED Triage Notes (Addendum)
Pt. Stated, I started having stomach pain lower and now its blood in my urine since Sunday ?Ive taken the Doxycycline from a previous RX took for 7 days and did nothing except take away the urinary symptoms. Not the blood and pain. ?

## 2021-05-08 NOTE — Discharge Instructions (Signed)
Please seek immediate care if you develop the following: ?There is back pain.  ?Your symptoms are no better or worse in 3 days. ?There is severe back pain or lower abdominal pain.  ?You develop chills.  ?You have a fever.  ?There is nausea or vomiting.  ?There is continued burning or discomfort with urination.  ? ?

## 2021-05-08 NOTE — ED Provider Notes (Signed)
?[facility] ?Provider Note ? ? ?CSN: [id number] ?Arrival date & time: 05/08/21  1022 ? ?  ? ?History ? ?Chief Complaint  ?Patient presents with  ? Hematuria  ? Abdominal Pain  ? ? ?TERECIA PLAUT is a 46 y.o. female who presents with a cc of Dysuria x2 weeks . She has associated urinary frequency,urgency now progressively worsening with suprapubic pain and hematuria. She had an isolated fever one week ago. She denies flank pain, n/v/vaginal sxs. She has a hx of urinary sxs,. ? ? ?Hematuria ?Associated symptoms include abdominal pain.  ?Abdominal Pain ?Associated symptoms: hematuria   ? ?  ? ?Home Medications ?Prior to Admission medications   ?Medication Sig Start Date End Date Taking? Authorizing Provider  ?amLODipine (NORVASC) 10 MG tablet Take 1 tablet (10 mg total) by mouth daily after supper. 05/13/17   Dorena Dew, FNP  ?bacitracin ointment Apply to affected area daily 11/22/20 11/22/21  Winferd Humphrey, PA-C  ?busPIRone (BUSPAR) 10 MG tablet Take 10 mg by mouth 3 (three) times daily. 09/11/20   [provider]  ?clonazePAM (KLONOPIN) 1 MG tablet Take 1 mg by mouth 3 (three) times daily as needed for anxiety. 11/20/20   [provider]  ?CLONAZEPAM PO Take by mouth.    [provider]  ?cloNIDine (CATAPRES) 0.1 MG tablet Take 0.1 mg by mouth 2 (two) times daily. 07/24/20   [provider]  ?cyclobenzaprine (FLEXERIL) 10 MG tablet Take 1 tablet (10 mg total) by mouth 2 (two) times daily as needed for muscle spasms. 04/28/20   Hughie Closs, PA-C  ?cyclobenzaprine (FLEXERIL) 10 MG tablet Take 10 mg by mouth daily. 08/20/20   [provider]  ?DULoxetine (CYMBALTA) 60 MG capsule Take 1 capsule (60 mg total) by mouth daily. ?Patient not taking: Reported on 04/28/2020 09/07/19   Laurin Coder, MD  ?gabapentin (NEURONTIN) 300 MG capsule Take 1 capsule (300 mg total) by mouth 3 (three) times daily for 10 days. 10/14/20 10/24/20   Melynda Ripple, MD  ?gabapentin (NEURONTIN) 300 MG capsule Take 300 mg by mouth 3 (three) times daily. 07/27/20   [provider]  ?hydrOXYzine (ATARAX/VISTARIL) 25 MG tablet Take 25 mg by mouth 3 (three) times daily. 06/24/20   [provider]  ?ketorolac (TORADOL) 10 MG tablet Take 10 mg by mouth every 6 (six) hours as needed. 10/14/20   [provider]  ?lidocaine (LIDODERM) 5 % Place 1 patch onto the skin daily. Remove & Discard patch within 12 hours or as directed by MD 10/14/20   Melynda Ripple, MD  ?lisinopril-hydrochlorothiazide (PRINZIDE,ZESTORETIC) 20-25 MG tablet Take 1 tablet by mouth daily. 03/11/17   Fransico Meadow, PA-C  ?meloxicam (MOBIC) 7.5 MG tablet Take 7.5 mg by mouth daily. 06/18/20   [provider]  ?predniSONE (STERAPRED UNI-PAK 21 TAB) 10 MG (21) TBPK tablet Dispense one 6 day pack. Take as directed with food. 03/02/21   Blue, Soijett A, PA-C  ?dicyclomine (BENTYL) 20 MG tablet Take 1 tablet (20 mg total) by mouth 2 (two) times daily. ?Patient not taking: Reported on 06/24/2017 09/14/16 11/09/18  Lacretia Leigh, MD  ?famotidine (PEPCID) 20 MG tablet Take 1 tablet (20 mg total) by mouth 2 (two) times daily. ?Patient not taking: Reported on 06/24/2017 09/14/16 11/09/18  Lacretia Leigh, MD  ?omeprazole (PRILOSEC) 20 MG capsule Take 1 capsule (20 mg total) by mouth daily. ?Patient not taking: Reported on 06/24/2017 07/19/16 11/09/18  Okey Regal, PA-C  ?   ? ?  Allergies    ?Ceftin and Ceftin [cefuroxime]   ? ?Review of Systems   ?Review of Systems  ?Gastrointestinal:  Positive for abdominal pain.  ?Genitourinary:  Positive for hematuria.  ? ?Physical Exam ?Updated Vital Signs ?BP (!) 180/108   Pulse 75   Temp 98.5 ?F (36.9 ?C) (Oral)   Resp 18   LMP 06/19/2014   SpO2 96%  ?Physical Exam ?Vitals and nursing note reviewed.  ?Constitutional:   ?   General: She is not in acute distress. ?   Appearance: She is well-developed. She is not diaphoretic.  ?HENT:  ?   Head:  Normocephalic and atraumatic.  ?   Right Ear: External ear normal.  ?   Left Ear: External ear normal.  ?   Nose: Nose normal.  ?   Mouth/Throat:  ?   Mouth: Mucous membranes are moist.  ?Eyes:  ?   General: No scleral icterus. ?   Conjunctiva/sclera: Conjunctivae normal.  ?Cardiovascular:  ?   Rate and Rhythm: Normal rate and regular rhythm.  ?   Heart sounds: Normal heart sounds. No murmur heard. ?  No friction rub. No gallop.  ?Pulmonary:  ?   Effort: Pulmonary effort is normal. No respiratory distress.  ?   Breath sounds: Normal breath sounds.  ?Abdominal:  ?   General: Bowel sounds are normal. There is no distension.  ?   Palpations: Abdomen is soft. There is no mass.  ?   Tenderness: There is abdominal tenderness in the suprapubic area. There is no right CVA tenderness, left CVA tenderness or guarding.  ?Musculoskeletal:  ?   Cervical back: Normal range of motion.  ?Skin: ?   General: Skin is warm and dry.  ?Neurological:  ?   Mental Status: She is alert and oriented to person, place, and time.  ?Psychiatric:     ?   Behavior: Behavior normal.  ? ? ?ED Results / Procedures / Treatments   ?Labs ?(all labs ordered are listed, but only abnormal results are displayed) ?Labs Reviewed  ?URINALYSIS, ROUTINE W REFLEX MICROSCOPIC - Abnormal; Notable for the following components:  ?    Result Value  ? APPearance CLOUDY (*)   ? Hgb urine dipstick LARGE (*)   ? Protein, ur 30 (*)   ? Leukocytes,Ua SMALL (*)   ? RBC / HPF >50 (*)   ? Bacteria, UA RARE (*)   ? Non Squamous Epithelial 0-5 (*)   ? All other components within normal limits  ?CBC - Abnormal; Notable for the following components:  ? WBC 11.9 (*)   ? Hemoglobin 11.9 (*)   ? HCT 35.1 (*)   ? MCV 77.5 (*)   ? nRBC 0.3 (*)   ? All other components within normal limits  ?URINE CULTURE  ?LIPASE, BLOOD  ?COMPREHENSIVE METABOLIC PANEL  ?PREGNANCY, URINE  ? ? ?EKG ?None ? ?Radiology ?No results found. ? ?Procedures ?Procedures  ? ? ?Medications Ordered in ED ?Medications  - No data to display ? ?ED Course/ Medical Decision Making/ A&P ?Clinical Course as of 05/08/21 1839  ?Thu May 08, 2021  ?1757 Bacteria, UA(!): RARE [DN]  ?1838 CBC(!) [AH]  ?1838 WBC(!): 11.9 [AH]  ?0865 Comprehensive metabolic panel [AH]  ?1838 Urinalysis, Routine w reflex microscopic Urine, Clean Catch(!) ?Mild leukocytosis, + hematuria/ bacteria Leukocytes [AH]  ?  ?Clinical Course User Index ?[AH] Margarita Mail, PA-C ?[DN] Doylene Bode, [place]  ? ?                        ?  Medical Decision Making ?46 year old female here with dysuria, hematuria and suprapubic pain, no vaginal symptoms.  Patient was worried because she is never had blood in her urine before.  This is likely a lower urinary tract infection although early pyelocould be potential given her white blood cell count.  She does not have CVA tenderness.  At this point I think she is safe for treatment as UTI.  She has anaphylaxis to Ceftin and will be discharged with Macrobid for 7 days along with Pyridium.  I doubt the patient needs any further imaging although I considered the possibility for renal stone study she does not appear to be in severe pain or need further imaging at this time.  Patient will be discharged with symptomatic treatment.  She is notably hypertensive and states that she has not taken her blood pressure medications in the last 2 days because she was given the wrong medication at the pharmacy.  They are waiting there and she will pick them up today ? ?Amount and/or Complexity of Data Reviewed ?Labs: ordered. Decision-making details documented in ED Course. ? ?Risk ?Prescription drug management. ? ?Final Clinical Impression(s) / ED Diagnoses ?Final diagnoses:  ?Lower urinary tract infectious disease  ? ? ?Rx / DC Orders ?ED Discharge Orders   ? ? None  ? ?  ? ? ?  ?Margarita Mail, PA-C ?05/08/21 1845 ? ?  ?Deno Etienne, DO ?05/08/21 1915 ? ?

## 2021-05-08 NOTE — ED Provider Triage Note (Signed)
Emergency Medicine Provider Triage Evaluation Note ? ?Linda Sheppard , a 46 y.o. female  was evaluated in triage.  Pt complains of 1.5 weeks of UTI symptoms. States some pelvic discomfort. Hematuria too. States she tried to drink water and took doxycycline but continued symptoms.  ? ?Review of Systems  ?Positive: UTI sx ?Negative: Vomiting ? ?Physical Exam  ?BP (!) 147/119 (BP Location: Left Arm)   Pulse 90   Temp 99.1 ?F (37.3 ?C) (Oral)   Resp 20   LMP 06/19/2014   SpO2 98%  ?Gen:   Awake, no distress   ?Resp:  Normal effort  ?MSK:   Moves extremities without difficulty  ?Other:   ? ?Medical Decision Making  ?Medically screening exam initiated at 11:23 AM.  Appropriate orders placed.  Linda Sheppard was informed that the remainder of the evaluation will be completed by another provider, this initial triage assessment does not replace that evaluation, and the importance of remaining in the ED until their evaluation is complete. ? ?Labs and urine ?  ?Tedd Sias, [place] ?05/08/21 1126 ? ?

## 2021-05-09 LAB — URINE CULTURE

## 2021-08-07 DEATH — deceased
